# Patient Record
Sex: Male | Born: 1935 | Race: White | Hispanic: No | Marital: Married | Smoking: Former smoker
Health system: Southern US, Community
[De-identification: ages and names within clinical notes are randomized; demographics above are authoritative.]

## PROBLEM LIST (undated history)

## (undated) DIAGNOSIS — I509 Heart failure, unspecified: Secondary | ICD-10-CM

## (undated) DIAGNOSIS — I2510 Atherosclerotic heart disease of native coronary artery without angina pectoris: Secondary | ICD-10-CM

## (undated) DIAGNOSIS — E785 Hyperlipidemia, unspecified: Secondary | ICD-10-CM

## (undated) DIAGNOSIS — F0390 Unspecified dementia without behavioral disturbance: Secondary | ICD-10-CM

## (undated) DIAGNOSIS — I513 Intracardiac thrombosis, not elsewhere classified: Secondary | ICD-10-CM

## (undated) DIAGNOSIS — I10 Essential (primary) hypertension: Secondary | ICD-10-CM

## (undated) DIAGNOSIS — J449 Chronic obstructive pulmonary disease, unspecified: Secondary | ICD-10-CM

## (undated) DIAGNOSIS — I4891 Unspecified atrial fibrillation: Secondary | ICD-10-CM

## (undated) DIAGNOSIS — IMO0001 Reserved for inherently not codable concepts without codable children: Secondary | ICD-10-CM

## (undated) HISTORY — DX: Unspecified dementia, unspecified severity, without behavioral disturbance, psychotic disturbance, mood disturbance, and anxiety: F03.90

## (undated) HISTORY — DX: Intracardiac thrombosis, not elsewhere classified: I51.3

## (undated) HISTORY — DX: Atherosclerotic heart disease of native coronary artery without angina pectoris: I25.10

## (undated) HISTORY — DX: Essential (primary) hypertension: I10

## (undated) HISTORY — DX: Heart failure, unspecified: I50.9

## (undated) HISTORY — PX: HERNIA REPAIR: SHX51

## (undated) HISTORY — DX: Hyperlipidemia, unspecified: E78.5

---

## 1998-05-17 HISTORY — PX: CARDIAC CATHETERIZATION: SHX172

## 1999-05-14 ENCOUNTER — Inpatient Hospital Stay (HOSPITAL_COMMUNITY): Admission: EM | Admit: 1999-05-14 | Discharge: 1999-05-17 | Payer: Self-pay | Admitting: Emergency Medicine

## 1999-05-14 ENCOUNTER — Encounter: Payer: Self-pay | Admitting: Emergency Medicine

## 1999-05-16 ENCOUNTER — Encounter: Payer: Self-pay | Admitting: Cardiology

## 2001-04-03 ENCOUNTER — Encounter: Payer: Self-pay | Admitting: General Surgery

## 2001-04-03 ENCOUNTER — Encounter: Admission: RE | Admit: 2001-04-03 | Discharge: 2001-04-03 | Payer: Self-pay | Admitting: General Surgery

## 2001-04-04 ENCOUNTER — Ambulatory Visit (HOSPITAL_BASED_OUTPATIENT_CLINIC_OR_DEPARTMENT_OTHER): Admission: RE | Admit: 2001-04-04 | Discharge: 2001-04-05 | Payer: Self-pay | Admitting: General Surgery

## 2001-04-04 ENCOUNTER — Encounter (INDEPENDENT_AMBULATORY_CARE_PROVIDER_SITE_OTHER): Payer: Self-pay | Admitting: *Deleted

## 2002-02-07 ENCOUNTER — Ambulatory Visit (HOSPITAL_COMMUNITY): Admission: EM | Admit: 2002-02-07 | Discharge: 2002-02-08 | Payer: Self-pay | Admitting: Cardiovascular Disease

## 2002-02-07 ENCOUNTER — Encounter: Payer: Self-pay | Admitting: *Deleted

## 2006-05-13 ENCOUNTER — Encounter: Admission: RE | Admit: 2006-05-13 | Discharge: 2006-05-13 | Payer: Self-pay | Admitting: Family Medicine

## 2006-05-17 HISTORY — PX: ABDOMINAL AORTIC ANEURYSM REPAIR: SUR1152

## 2006-06-03 ENCOUNTER — Encounter: Admission: RE | Admit: 2006-06-03 | Discharge: 2006-06-03 | Payer: Self-pay | Admitting: Vascular Surgery

## 2006-06-20 ENCOUNTER — Ambulatory Visit: Payer: Self-pay | Admitting: *Deleted

## 2006-06-22 ENCOUNTER — Ambulatory Visit: Payer: Self-pay | Admitting: Vascular Surgery

## 2006-06-22 ENCOUNTER — Inpatient Hospital Stay (HOSPITAL_COMMUNITY): Admission: RE | Admit: 2006-06-22 | Discharge: 2006-06-27 | Payer: Self-pay | Admitting: Vascular Surgery

## 2006-06-22 ENCOUNTER — Encounter (INDEPENDENT_AMBULATORY_CARE_PROVIDER_SITE_OTHER): Payer: Self-pay | Admitting: Specialist

## 2006-07-13 ENCOUNTER — Ambulatory Visit: Payer: Self-pay | Admitting: Vascular Surgery

## 2006-07-22 ENCOUNTER — Ambulatory Visit: Payer: Self-pay | Admitting: Vascular Surgery

## 2007-08-02 ENCOUNTER — Ambulatory Visit: Payer: Self-pay | Admitting: Vascular Surgery

## 2008-01-29 ENCOUNTER — Encounter: Admission: RE | Admit: 2008-01-29 | Discharge: 2008-01-29 | Payer: Self-pay | Admitting: Sports Medicine

## 2008-02-12 ENCOUNTER — Encounter: Admission: RE | Admit: 2008-02-12 | Discharge: 2008-02-12 | Payer: Self-pay | Admitting: Sports Medicine

## 2008-03-11 ENCOUNTER — Encounter: Admission: RE | Admit: 2008-03-11 | Discharge: 2008-03-11 | Payer: Self-pay | Admitting: Sports Medicine

## 2008-07-31 ENCOUNTER — Encounter: Admission: RE | Admit: 2008-07-31 | Discharge: 2008-07-31 | Payer: Self-pay | Admitting: Vascular Surgery

## 2008-07-31 ENCOUNTER — Ambulatory Visit: Payer: Self-pay | Admitting: Vascular Surgery

## 2009-08-13 ENCOUNTER — Ambulatory Visit: Payer: Self-pay | Admitting: Vascular Surgery

## 2009-12-18 ENCOUNTER — Ambulatory Visit: Payer: Self-pay | Admitting: Cardiology

## 2010-01-15 ENCOUNTER — Ambulatory Visit: Payer: Self-pay | Admitting: Cardiology

## 2010-02-13 ENCOUNTER — Ambulatory Visit: Payer: Self-pay | Admitting: Cardiology

## 2010-03-16 ENCOUNTER — Ambulatory Visit: Payer: Self-pay | Admitting: Cardiology

## 2010-04-13 ENCOUNTER — Ambulatory Visit: Payer: Self-pay | Admitting: Cardiology

## 2010-04-13 LAB — POCT INR: INR: 3.2

## 2010-04-27 ENCOUNTER — Ambulatory Visit: Payer: Self-pay | Admitting: Cardiology

## 2010-04-27 LAB — POCT INR: INR: 2.2

## 2010-06-01 ENCOUNTER — Ambulatory Visit: Payer: Self-pay | Admitting: Cardiology

## 2010-06-07 ENCOUNTER — Encounter: Payer: Self-pay | Admitting: Family Medicine

## 2010-06-24 ENCOUNTER — Encounter (INDEPENDENT_AMBULATORY_CARE_PROVIDER_SITE_OTHER): Payer: Medicare Other

## 2010-06-24 DIAGNOSIS — Z7901 Long term (current) use of anticoagulants: Secondary | ICD-10-CM

## 2010-06-24 DIAGNOSIS — I2510 Atherosclerotic heart disease of native coronary artery without angina pectoris: Secondary | ICD-10-CM

## 2010-06-25 ENCOUNTER — Encounter: Payer: Self-pay | Admitting: Cardiology

## 2010-06-25 DIAGNOSIS — I2510 Atherosclerotic heart disease of native coronary artery without angina pectoris: Secondary | ICD-10-CM | POA: Insufficient documentation

## 2010-06-25 DIAGNOSIS — E785 Hyperlipidemia, unspecified: Secondary | ICD-10-CM | POA: Insufficient documentation

## 2010-06-25 DIAGNOSIS — I513 Intracardiac thrombosis, not elsewhere classified: Secondary | ICD-10-CM | POA: Insufficient documentation

## 2010-06-25 DIAGNOSIS — I110 Hypertensive heart disease with heart failure: Secondary | ICD-10-CM | POA: Insufficient documentation

## 2010-06-25 DIAGNOSIS — I5042 Chronic combined systolic (congestive) and diastolic (congestive) heart failure: Secondary | ICD-10-CM | POA: Insufficient documentation

## 2010-07-22 ENCOUNTER — Other Ambulatory Visit (INDEPENDENT_AMBULATORY_CARE_PROVIDER_SITE_OTHER): Payer: Medicare Other

## 2010-07-22 DIAGNOSIS — I2510 Atherosclerotic heart disease of native coronary artery without angina pectoris: Secondary | ICD-10-CM

## 2010-07-22 DIAGNOSIS — Z7901 Long term (current) use of anticoagulants: Secondary | ICD-10-CM

## 2010-08-12 ENCOUNTER — Ambulatory Visit (INDEPENDENT_AMBULATORY_CARE_PROVIDER_SITE_OTHER): Payer: Medicare Other | Admitting: Cardiology

## 2010-08-12 ENCOUNTER — Encounter: Payer: Self-pay | Admitting: Cardiology

## 2010-08-12 DIAGNOSIS — I509 Heart failure, unspecified: Secondary | ICD-10-CM

## 2010-08-12 DIAGNOSIS — F17200 Nicotine dependence, unspecified, uncomplicated: Secondary | ICD-10-CM

## 2010-08-12 DIAGNOSIS — I219 Acute myocardial infarction, unspecified: Secondary | ICD-10-CM

## 2010-08-12 DIAGNOSIS — I10 Essential (primary) hypertension: Secondary | ICD-10-CM

## 2010-08-12 DIAGNOSIS — Z720 Tobacco use: Secondary | ICD-10-CM

## 2010-08-12 DIAGNOSIS — E7800 Pure hypercholesterolemia, unspecified: Secondary | ICD-10-CM

## 2010-08-12 DIAGNOSIS — I513 Intracardiac thrombosis, not elsewhere classified: Secondary | ICD-10-CM

## 2010-08-12 DIAGNOSIS — E785 Hyperlipidemia, unspecified: Secondary | ICD-10-CM

## 2010-08-12 DIAGNOSIS — I259 Chronic ischemic heart disease, unspecified: Secondary | ICD-10-CM

## 2010-08-12 DIAGNOSIS — I2510 Atherosclerotic heart disease of native coronary artery without angina pectoris: Secondary | ICD-10-CM

## 2010-08-12 DIAGNOSIS — Z79899 Other long term (current) drug therapy: Secondary | ICD-10-CM

## 2010-08-12 NOTE — Assessment & Plan Note (Signed)
Remote anterior myocardial infarction. Cardiac catheterization in 2000 showed occlusion of the LAD. He currently has no significant angina. Continue risk factor modification.

## 2010-08-12 NOTE — Assessment & Plan Note (Signed)
Blood pressure is controlled. Continue current therapy. Continue sodium restriction.

## 2010-08-12 NOTE — Assessment & Plan Note (Signed)
Well compensated on exam. Continue ACE inhibitor, diuretics, and beta blocker therapy.

## 2010-08-12 NOTE — Assessment & Plan Note (Signed)
We discussed smoking cessation strategies. This includes prescription medication such as Chantix or Wellbutrin. We also discussed nicotine replacement products such as patches, inhalers, or gum. Patient would like to try nicotine replacement products first and he can obtain these over the counter. If this is unsuccessful  we may try prescription therapy in the future.

## 2010-08-12 NOTE — Assessment & Plan Note (Signed)
We'll check fasting chemistry panel and lipid panel today. Continue on Vytorin.

## 2010-08-12 NOTE — Patient Instructions (Signed)
Continue your current medical therapy.  We will call with the results of your lab work today.  We discussed smoking cessation strategies. We will try the nicotine replacement products such as nicotine patches and or gum that you can obtain over-the-counter.  We will followup your next office visit in 6 months.  Your INR is already scheduled for April.

## 2010-08-12 NOTE — Progress Notes (Signed)
HPI This was seen for six-month followup today. He states he has been doing well without any significant anginal symptoms. He still gets short of breath at times and admits that he needs to quit smoking. He is interested in smoking cessation strategies. He denies any palpitations or dizziness. Allergies  Allergen Reactions  . Codeine   . Morphine And Related     Current Outpatient Prescriptions on File Prior to Visit  Medication Sig Dispense Refill  . aspirin 325 MG tablet Take 325 mg by mouth daily.        Marland Kitchen ezetimibe-simvastatin (VYTORIN) 10-40 MG per tablet Take 1 tablet by mouth at bedtime.        . furosemide (LASIX) 40 MG tablet Take 40 mg by mouth daily. 1/2 DAILY       . GLUCOSAMINE PO Take by mouth 3 (three) times daily.        . Lansoprazole (PREVACID PO) Take by mouth as needed.        . metoprolol (TOPROL-XL) 50 MG 24 hr tablet Take 50 mg by mouth daily.        . nitroGLYCERIN (NITROSTAT) 0.4 MG SL tablet Place 0.4 mg under the tongue every 5 (five) minutes as needed.        . ramipril (ALTACE) 10 MG tablet Take 10 mg by mouth daily.        Marland Kitchen warfarin (COUMADIN) 7.5 MG tablet Take 7.5 mg by mouth daily.        Marland Kitchen zolpidem (AMBIEN) 5 MG tablet Take 5 mg by mouth at bedtime as needed.          Past Medical History  Diagnosis Date  . Coronary disease     WITH REMOTE ANTERIOR MYOCARDIAL INFARCTION  . Mural thrombus of heart     CHRONIC  . Hypertension   . Hyperlipidemia   . CHF (congestive heart failure)     Past Surgical History  Procedure Date  . Cardiac catheterization 2000  . Hernia repair     STATUS POST BILATERAL  . Abdominal aortic aneurysm repair 2008    Family History  Problem Relation Age of Onset  . Cerebral aneurysm Father   . Cancer Sister   . Cancer Mother     History   Social History  . Marital Status: Married    Spouse Name: N/A    Number of Children: 2  . 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 Smoking status: Current Everyday Smoker -- 1.0 packs/day for 60 years    Types: Cigarettes  . Smokeless tobacco: Not on file  . Alcohol Use: No  . Drug Use: No  . Sexually Active:    Other Topics Concern  . Not on file   Social History Narrative  . No narrative on file    ROS HPI  Allergies  Allergen Reactions  . Codeine   . Morphine And Related     Current Outpatient Prescriptions on File Prior to Visit  Medication Sig Dispense Refill  . aspirin 325 MG tablet Take 325 mg by mouth daily.        Marland Kitchen ezetimibe-simvastatin (VYTORIN) 10-40 MG per tablet Take 1 tablet by mouth at bedtime.        . furosemide (LASIX) 40 MG tablet Take 40 mg by mouth daily. 1/2 DAILY       . GLUCOSAMINE PO Take by mouth 3 (three) times daily.        Marland Kitchen  Lansoprazole (PREVACID PO) Take by mouth as needed.        . metoprolol (TOPROL-XL) 50 MG 24 hr tablet Take 50 mg by mouth daily.        . nitroGLYCERIN (NITROSTAT) 0.4 MG SL tablet Place 0.4 mg under the tongue every 5 (five) minutes as needed.        . ramipril (ALTACE) 10 MG tablet Take 10 mg by mouth daily.        Marland Kitchen warfarin (COUMADIN) 7.5 MG tablet Take 7.5 mg by mouth daily.        Marland Kitchen zolpidem (AMBIEN) 5 MG tablet Take 5 mg by mouth at bedtime as needed.          Past Medical History  Diagnosis Date  . Coronary disease     WITH REMOTE ANTERIOR MYOCARDIAL INFARCTION  . Mural thrombus of heart     CHRONIC  . Hypertension   . Hyperlipidemia   . CHF (congestive heart failure)     Past Surgical History  Procedure Date  . Cardiac catheterization 2000  . Hernia repair     STATUS POST BILATERAL  . Abdominal aortic aneurysm repair 2008    Family History  Problem Relation Age of Onset  . Cerebral aneurysm Father   . Cancer Sister   . Cancer Mother     History   Social History  . Marital Status: Married    Spouse Name: N/A    Number of Children: 2  . 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 Smoking status: Current Everyday Smoker -- 1.0 packs/day for 60 years    Types: Cigarettes  . Smokeless tobacco: Not on file  . Alcohol Use: No  . Drug Use: No  . Sexually Active:    Other Topics Concern  . Not on file   Social History Narrative  . No narrative on file    ROS The patient denies any heat or cold intolerance.  No weight gain or weight loss.  The patient denies headaches or blurry vision.  There is no cough or sputum production.  The patient denies dizziness.  There is no hematuria or hematochezia.  The patient denies any muscle aches or arthritis.  The patient denies any rash.  The patient denies frequent falling or instability.  There is no history of depression or anxiety.  All other systems were reviewed and are negative.  PHYSICAL EXAM BP 142/82  Pulse 52  Ht 5\' 9"  (1.753 m)  Wt 178 lb 3.2 oz (80.831 kg)  BMI 26.32 kg/m2 He is an elderly, chronically ill-appearing white male in no acute distress. HEENT exam reveals he is normocephalic, atraumatic. His pupils are equal and reactive. Sclera are clear. Oropharynx is clear. Neck is supple without JVD, adenopathy, thyromegaly, or bruits. Lungs are clear. Cardiac exam reveals a regular rate and rhythm without gallop or murmur. Abdomen is soft and nontender without masses or bruits. Femoral and pedal pulses are 2+ and symmetric. He has no edema. Skin is warm and dry. Patient is alert and oriented x3. He does have chronic memory loss. Gait is normal.  ASSESSMENT AND PLAN

## 2010-08-12 NOTE — Assessment & Plan Note (Signed)
On chronic Coumadin therapy. INR has been therapeutic.

## 2010-08-13 LAB — COMPREHENSIVE METABOLIC PANEL
ALT: 20 U/L (ref 0–53)
AST: 21 U/L (ref 0–37)
Albumin: 4.2 g/dL (ref 3.5–5.2)
Chloride: 103 mEq/L (ref 96–112)
Glucose, Bld: 117 mg/dL — ABNORMAL HIGH (ref 70–99)
Total Bilirubin: 2.1 mg/dL — ABNORMAL HIGH (ref 0.3–1.2)
Total Protein: 5.9 g/dL — ABNORMAL LOW (ref 6.0–8.3)

## 2010-08-13 LAB — CBC WITH DIFFERENTIAL/PLATELET
Basophils Relative: 0 % (ref 0–1)
Eosinophils Relative: 3 % (ref 0–5)
MCV: 101.7 fL — ABNORMAL HIGH (ref 78.0–100.0)
Monocytes Absolute: 0.5 10*3/uL (ref 0.1–1.0)
Neutrophils Relative %: 63 % (ref 43–77)
Platelets: 122 10*3/uL — ABNORMAL LOW (ref 150–400)
RBC: 5.39 MIL/uL (ref 4.22–5.81)
RDW: 14.8 % (ref 11.5–15.5)
WBC: 7.2 10*3/uL (ref 4.0–10.5)

## 2010-08-13 LAB — TSH: TSH: 6.069 u[IU]/mL — ABNORMAL HIGH (ref 0.350–4.500)

## 2010-08-17 ENCOUNTER — Telehealth: Payer: Self-pay | Admitting: *Deleted

## 2010-08-17 NOTE — Telephone Encounter (Signed)
Message copied by Murrell Redden on Mon Aug 17, 2010  1:50 PM ------      Message from: [place], PETER      Created: Thu Aug 13, 2010  1:16 PM       Labs overall OK. Increased Hgb consistent with chronic tobacco abuse. Mild increase in bilirubin. Lipids ok. Mild incresed in TSH. Would repeat TSH and free T4 next visit.

## 2010-08-17 NOTE — Telephone Encounter (Signed)
Notified of lab results. 

## 2010-09-09 ENCOUNTER — Encounter: Payer: Medicare Other | Admitting: *Deleted

## 2010-09-15 ENCOUNTER — Encounter: Payer: Medicare Other | Admitting: *Deleted

## 2010-09-17 ENCOUNTER — Other Ambulatory Visit: Payer: Self-pay | Admitting: *Deleted

## 2010-09-17 DIAGNOSIS — I219 Acute myocardial infarction, unspecified: Secondary | ICD-10-CM

## 2010-09-18 ENCOUNTER — Other Ambulatory Visit: Payer: Self-pay | Admitting: *Deleted

## 2010-09-18 ENCOUNTER — Ambulatory Visit (INDEPENDENT_AMBULATORY_CARE_PROVIDER_SITE_OTHER): Payer: Medicare Other | Admitting: *Deleted

## 2010-09-18 DIAGNOSIS — I219 Acute myocardial infarction, unspecified: Secondary | ICD-10-CM

## 2010-09-18 DIAGNOSIS — I8290 Acute embolism and thrombosis of unspecified vein: Secondary | ICD-10-CM

## 2010-09-18 LAB — POCT INR: INR: 3.1

## 2010-09-18 LAB — PROTIME-INR: INR: 3.1 — AB (ref 0.9–1.1)

## 2010-09-29 NOTE — Assessment & Plan Note (Signed)
OFFICE VISIT   CHAO, BLAZEJEWSKI  DOB:  1933/04/18                                       08/02/2007  EAVWU#:[id number]   The patient presents for followup today after repair of his juxtarenal  abdominal aortic aneurysm on February 6.  He states that his appetite  has essentially returned to normal.  He is having normal bowel  movements.  He has essentially returned to his normal activities.   PHYSICAL EXAM:  Blood pressure is 147/88 in the left arm, heart rate is  72 and regular.  Abdomen incision is well-healed.  There is no hernia or  defect.  He has 2+ femoral pulses bilaterally.  The patient is doing  well and has recovered from his aneurysm repair at this point.  He will  follow up with me and a CT angiogram in 1 year.   Janetta Hora. Fields, MD  Electronically Signed   CEF/MEDQ  D:  08/02/2007  T:  08/03/2007  Job:  [id number]   cc:   Teena Irani. Arlyce Dice, M.D.

## 2010-09-29 NOTE — Procedures (Signed)
RENAL ARTERY DUPLEX EVALUATION   INDICATION:  Followup renal artery stenosis and abdominal aortic  aneurysm repair.   HISTORY:  Diabetes:  No.  Cardiac:  No.  Hypertension:  Yes.  Smoking:  Yes.   RENAL ARTERY DUPLEX FINDINGS:  Aorta-Proximal:  71 cm/s  Aorta-Mid:  62 cm/s  Aorta-Distal:  75 cm/s (graft)  Celiac Artery Origin:  118 cm/s  SMA Origin:  99 cm/s                                    RIGHT               LEFT  Renal Artery Origin:             125 cm/s            97 cm/s  Renal Artery Proximal:           141 cm/s            89 cm/s  Renal Artery Mid:                117 cm/s            72 cm/s  Renal Artery Distal:             47 cm/s             48 cm/s  Hilar Acceleration Time (AT):    m/s2                m/s2  Renal-Aortic Ratio (RAR):        1.98                1.37  Kidney Size:                     11 cm               10.7 cm  End Diastolic Ratio (EDR):  Resistive Index (RI):            0.71                0.7   IMPRESSION:  1. Bilateral renal arteries appear patent and within normal limits.  2. Evidence of multiple bilateral renal arteries.  3. Bilateral RI appear within normal limits.  4. Bilateral RAR appear within normal limits.  5. Bilateral kidneys appear within normal limits in regard to shape      and size.  6. Stable cysts from CT with measurements today R=3.58 cm x 3.49 cm,      L=4.15 cm x 4.98 cm.  7. Residual abdominal aortic aneurysm sac measures 3.45 cm x 3.38 cm      and appear stable.  8. Patent aortic graft with no evidence of stenosis.  9. No bowel gas and acoustic shadowing.   ___________________________________________  Janetta Hora. Fields, MD   AS/MEDQ  D:  08/13/2009  T:  08/13/2009  Job:  [id number]

## 2010-09-29 NOTE — Assessment & Plan Note (Signed)
OFFICE VISIT   Jay Guzman, Jay Guzman  DOB:  [birth year]                                       07/31/2008  [id number]#:[id number]   Mr. Gravlin is a 77 year old male who previously underwent repair of a  juxtarenal abdominal aortic aneurysm in February 2008.  He returns today  for regular scheduled followup.  He denies any back or abdominal pain.  His atherosclerotic risk factors continue to remain coronary artery  disease.  He had myocardial infarction in 2008.  He also has a history  of congestive heart failure and elevated cholesterol.   MEDICATIONS:  The patient is on aspirin 325 mg a day and Coumadin 5 mg a  day.  He did not remember the rest of his medications currently.   SOCIAL:  He is married, has 2 children, still works as a Electronics engineer.  He currently smokes 1 pack of cigarettes per day.  It was  discussed with him today for approximately 3 minutes smoking cessation.   REVIEW OF SYSTEMS:  He is 5 foot 9, 182 pounds.  He has had some mild  weight loss recently.  He also complains of some occasional arthritis,  muscle and joint pain.  Cardiac, pulmonary, GI, renal, neurologic,  psychiatric, ENT, and hematologic review of systems are all negative.   PHYSICAL EXAM:  Blood pressure 131/79 in the right arm, pulse 71 and  regular.  HEENT:  Unremarkable.  NECK:  2+ carotid pulses without bruit.  CHEST:  Clear to auscultation.  HEART:  Exam is regular rate and rhythm without murmur.  ABDOMEN:  Soft, nontender, nondistended.  No pulsatile mass.  EXTREMITIES:  He has 2+ radial, femoral, dorsalis pedis, and posterior  tibial pulses bilaterally.   He had a CT scan of abdomen and pelvis today which showed bilateral  patent renal arteries with no evidence of pseudoaneurysm or narrowing of  his aortic graft.  However, there was evidence that he had a large  amount of layered thrombus in a preexisting known ventricular aneurysm.  I called Dr. Delane Ginger and discussed this with him.  He will make a  note of this for Dr. Peter [place]aziland, the patient's cardiologist.  He has  followup scheduled with Dr. [place]and next week and he may need possibly to  increase his INR level to make sure that this thrombus does not continue  to accumulate.   As far as his aneurysm is concerned, everything looks well at this  point.  Mr. Wanninger will need a followup in 1 year's time and aortic  ultrasound at that time for further followup.   Janetta Hora. Fields, MD  Electronically Signed   CEF/MEDQ  D:  08/05/2008  T:  08/06/2008  Job:  [id number]   cc:   Peter M. [place], M.D.

## 2010-10-02 NOTE — H&P (Signed)
NAME:  Jay Guzman, Jay Guzman                       ACCOUNT NO.:  [id number]   MEDICAL RECORD NO.:  [id number]                   PATIENT TYPE:  OIB   LOCATION:  2014                                 FACILITY:  [facility]   PHYSICIAN:  Vesta Mixer, M.D.              DATE OF BIRTH:  [birth year]   DATE OF ADMISSION:  02/07/2002  DATE OF DISCHARGE:  02/08/2002                                HISTORY & PHYSICAL   HISTORY OF PRESENT ILLNESS:  Jay Guzman is a 77 year old gentleman with a  history of coronary artery disease with an occlusion of his left anterior  descending artery.  He was transferred from [facility] to [facility] after having an episode of dizziness and hypotension.   The patient has been in relatively good health.  He has been on his same  medications for the past several months.  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  The dizziness did not resolve even after 20-30 minutes and  was taken to [facility].  He was found to have a significant  hypotension with a systolic blood pressure in the 80s.  He responded quite  nicely to 400 cc of intravenous fluids.  His blood pressure increased to 105  without any problems.  Because he had had a previous anterior wall  myocardial infarction without any symptoms it was recommended that he stay  overnight for observation.  He wanted to be transferred to [facility] since  his doctors are down here.  He is transferred for further evaluation.   The patient currently feels better.  He has not had any episodes of chest  pain or shortness of breath.  He has not had any further episodes of  dizziness since this morning.   CURRENT MEDICATIONS:  Coumadin 10 mg three days a week with 7.5 mg four days  a week, Zocor 20 mg a day, Toprol XL 50 mg a day, aspirin 325 mg a day,  Lasix 40 mg a day, Altace 10 mg a day.   ALLERGIES:   MORPHINE AND CODEINE.   PAST MEDICAL HISTORY:  1. Coronary artery disease - he has a known occlusion of his mid-LAD.  2. Hypercholesterolemia.  3. Hypertension.   SOCIAL HISTORY:  The patient smokes about 1-1/2 packs of cigarettes a day.  He does not drink alcohol.   FAMILY HISTORY:  Noncontributory.   REVIEW OF SYSTEMS:  Reviewed and essentially negative.   PHYSICAL EXAMINATION:  GENERAL:  He is a middle-aged gentleman in no acute  distress.  He is alert and oriented times 3.  His mood and affect are  normal.   VITALS:  Blood pressure is 110/70, heart rate is 66.   HEENT EXAM:  Reveals 2+ carotids.  He has no bruits.  There is no JVD,  no  thyromegaly.   LUNGS:  Clear to auscultation.   HEART:  Regular rate, S1 and S2.  He has no murmurs.   ABDOMEN EXAM:  Revealed good bowel sounds and is nontender.   EXTREMITIES:  He has no clubbing, cyanosis, or edema.   NEURO EXAM:  Nonfocal.  His gait is normal.   His EKG is pending.   His telemetry monitor reveals normal sinus rhythm.   LABORATORY DATA:  His troponin and CPKs were negative at [facility].   ASSESSMENT/PLAN:  [name] presents with an episode of lightheadedness  and hypotension.  He responded quite nicely to one bullous of intravenous  fluids.  He has not had any further episodes.  We will continue with the  same medications.  We will check cardiac enzymes.  We will also check a  thyroid-stimulating hormone and monitor him overnight.  If he does not have  any further episodes then he should be able to go home tomorrow.  We may end  up having to decrease his dose of anti-hypertensives or his Lasix.  We will  lead Dr. Swaziland make that decision in the morning.  We will also add a  thyroid-stimulating hormone to his next blood draw.  All of his other  medical problems are stable.                                               Vesta Mixer, M.D.    PJN/MEDQ  D:  02/07/2002  T:  02/08/2002  Job:   [id number]   cc:   Peter M. [place], M.D.  [address], [address]  [place], [place] [address]  Fax: [phone]   Armstead Peaks, M.D.  [address] [place], [place] [address]  Fax: [phone]

## 2010-10-02 NOTE — H&P (Signed)
Jay Guzman, Jay Guzman             ACCOUNT NO.:  [id number]   MEDICAL RECORD NO.:  [id number]          PATIENT TYPE:  INP   LOCATION:  NA                           FACILITY:  [facility]   PHYSICIAN:  Charles E. Fields, MD  DATE OF BIRTH:  05/04/1933   DATE OF ADMISSION:  06/22/2006  DATE OF DISCHARGE:                              HISTORY & PHYSICAL   REASON FOR ADMISSION:  Abdominal aortic aneurysm.   HISTORY OF PRESENT ILLNESS:  The patient is a 77 year old white male who  was recently referred to Dr. Darrick Penna for evaluation of an abdominal  aortic aneurysm.  Apparently when the patient was involved in an  accident around 10 years ago, he had a CT scan which showed an  incidental 2.5 cm abdominal aortic aneurysm.  He was told to follow up  annually for repeat CT scans and surveillance, but has been lost to  follow-up.  He recently became established with Dr. Dara Hoyer at  [facility], and mentioned his history of an aneurysm.  Dr. Arlyce Dice ordered a CT scan, and this confirmed a 6.1 cm abdominal  aortic aneurysm.  He was referred to Dr. Darrick Penna, and Dr. Darrick Penna  subsequently ordered a CT angiogram to further delineate his anatomy for  possible stent graft repair.  This showed the abdominal aortic aneurysm  along with bilateral dual renal artery systems.  The abdominal aortic  aneurysm neck was shortened and there was no involvement of the common  iliac arteries bilaterally, although there was common iliac artery  tortuosity.  It was felt that because of all these factors he was a poor  candidate for stent graft repair, and therefore based on the size of the  aneurysm Dr. Darrick Penna recommended proceeding with open abdominal aortic  aneurysm repair at this time.  The patient denies abdominal pain, back  pain or any other symptoms referable to the aneurysm.   PAST MEDICAL HISTORY:  1. Coronary artery disease status post previous myocardial infarction.  2. History of congestive  heart failure.  3. Hypertension.  4. Hyperlipidemia.   PAST SURGICAL HISTORY:  Bilateral inguinal hernia repairs.   MEDICATIONS:  1. Toprol XL 50 mg daily.  2. Coumadin 10 mg on Friday and 7.5 mg taken all other days (last      taken on June 17, 2006).  3. Furosemide 20 mg daily.  4. Altace 10 mg daily.  5. Aspirin 325 mg daily.  6. Vytorin 10/40mg  daily.   ALLERGIES:  He reports intolerances to MORPHINE AND CODEINE, which make  him crazy and gave him nerve problems.   SOCIAL HISTORY:  He is married and has 2 children.  He resides with his  wife in [place].  He continues to run a Company secretary.  He continues to smoke one to 1-1/2 half packs of cigarettes  per day, and has smoked for 58 years.  He does not consume alcohol.   FAMILY HISTORY:  His mother is deceased and had breast cancer and  coronary artery disease.  His father died of a brain aneurysm.  He had  one sister who died of breast cancer.  He has 3 living sisters; one who  has a history of a brain aneurysm, one who has a history of breast  cancer, and one who does not have any chronic medical problems of which  he is aware.   REVIEW OF SYSTEMS:  See history of present illness for pertinent  positives and negatives.  Also, he has some dyspnea on exertion,  particularly when climbing up hills.  He wears glasses.  He also has  some hearing loss.  He also complains of pain in his legs with  significant exertion, such as walking up hills.  He denies fevers,  chills, weight loss, recent infections, TIA symptoms, visual changes,  amaurosis fugax, dysphagia, dysarthria, syncope, presyncope, chest pain,  heart palpitations, shortness of breath, PND, orthopnea, cough,  wheezing, hemoptysis, abdominal pain, nausea, vomiting, diarrhea,  constipation, reflux symptoms; hematochezia, melena, hematuria, dysuria  or nocturia; lower extremity edema, lower extremity claudication  symptoms or rest pain,  nonhealing lower extremity ulcers, muscle or  joint pain or weakness, intolerance to heat or cold, anxiety, depression  or other psychiatric illness.   PHYSICAL EXAMINATION:  VITAL SIGNS:  Blood pressure 122/72, heart rate  64 and regular, respirations 18 and unlabored.  GENERAL:  This is an elderly white male in no acute distress.  HEENT: Normocephalic, atraumatic.  Pupils equal, round and react to  light and accommodation.  Extraocular movements intact.  The right TM  and canal are well visualized; the right canal is occluded with cerumen.  Nares patent bilaterally.  Oropharynx clear with upper dentures in place  and edentulous lower.  NECK:  Supple without lymphadenopathy, thyromegaly or carotid bruits.  HEART:  Regular rate and rhythm without murmurs, rubs or gallops.  LUNGS: Clear to auscultation.  ABDOMEN: Soft, obese, nontender, nondistended.  Active bowel sounds in  all quadrants.  I am unable to palpate a midline mass, secondary to his  obesity.  EXTREMITIES:  No clubbing, cyanosis or edema.  He has well-healed  bilateral groin scars from his inguinal hernia repairs.  Femoral pulses  are 2+ and symmetrical.  He has 1-2+ right dorsalis pedis and a 1-2+  left posterior tibial pulse; no other palpable pedal pulses.  NEUROLOGIC:  Cranial nerves II-XII grossly intact.  He is alert and  oriented x3.  His gait is within normal limits for age.  Muscle strength  is 5+ and symmetrical in the upper and lower extremities.   ASSESSMENT/PLAN:  This is a 77 year old male with a 6.1 cm abdominal  aortic aneurysm.  He will be admitted to [facility] on June 22, 2006 and undergo repair of his abdominal aortic aneurysm by Dr.  Fabienne Bruns.      Coral Ceo, P.A.      Janetta Hora. Fields, MD  Electronically Signed    GC/MEDQ  D:  06/20/2006  T:  06/20/2006  Job:  [id number]   cc:   Janetta Hora. Darrick Penna, MD  Peter M. [place], M.D.

## 2010-10-02 NOTE — Op Note (Signed)
NAMELUISFERNANDO, BRIGHTWELL             ACCOUNT NO.:  [id number]   MEDICAL RECORD NO.:  [id number]          PATIENT TYPE:  INP   LOCATION:  2315                         FACILITY:  [facility]   PHYSICIAN:  Janetta Hora. Fields, MD  DATE OF BIRTH:  1933/03/11   DATE OF PROCEDURE:  06/22/2006  DATE OF DISCHARGE:                               OPERATIVE REPORT   PROCEDURE:  Repair of abdominal aortic aneurysm.   PREOPERATIVE DIAGNOSIS:  Juxtarenal abdominal aortic aneurysm.   POSTOPERATIVE DIAGNOSIS:  Juxtarenal abdominal aortic aneurysm.   ANESTHESIA:  General.   ASSISTANT:  Zadie Rhine, PA-C; Coral Ceo, PA-C.   INDICATIONS:  The patient is a 77 year old male who has developed a  large juxtarenal abdominal aortic aneurysm.  He has a duplicated renal  arteries bilaterally and was not a candidate for endovascular stent  graft repair due to his accessory renals and short neck length.   OPERATIVE FINDINGS:  18 mm Dacron tube graft.   OPERATIVE DETAIL:  After obtaining informed consent, the patient taken  to the operating room.  The patient placed supine position operating  table.  After induction of general anesthesia and endotracheal  intubation, a nasogastric tube and Foley catheter were placed.  The  patient's entire abdomen and upper thighs were prepped, draped usual  sterile fashion.  Midline laparotomy incision was made, carried down  through subcutaneous tissues.  Fascia was divided and the peritoneum was  entered.  Exploration of abdomen revealed no significant pathology  except for a large infrarenal abdominal aortic aneurysm.  Omni retractor  was brought to the operative field and the small bowel was reflected to  the right, transverse colon and omentum was reflected superiorly.  Retroperitoneum was entered with cautery dissection.  Dissection was  carried up to the level left renal vein.  Omni retractor was then placed  in a position such that the left renal vein was retracted  superiorly to  expose the neck to the aneurysm.  There were two right renal arteries  dissected free.  The left inferior pole renal artery was just at the  neck of the aneurysm.  The larger left main renal artery was  approximately 1-2 cm above this.  The accessory renal artery on the left  side was dissected free circumferentially controlled with vessel loop.  Dissection was carried down to the main portion of the aneurysm, the  inferior mesenteric artery was dissected free circumferentially  controlled with vessel loop.  Next the left and right common iliac  arteries were dissected free circumferentially just below the aortic  bifurcation.  Umbilical tapes were placed around these.  Next the  patient was given 7000 units of intravenous heparin amd 25 grams of  mannitol.  The aorta was clamped with the clamp above the accessory left  renal artery but below both right renal arteries. Both common iliac  arteries were clamped just below the aortic bifurcation.  The aneurysm  was entered.  Contents were removed.  Several lumbar arteries were  oversewn with 2-0 silk figure-of-eight sutures.  The aneurysm sac was  opened all the way up to the  neck of the aneurysm.  There was a large  amount of calcification in the neck.  This was endarterectomized to  provide more suitable sewing surface.  18 mm Dacron graft was brought up  in the operative field and this was sewn end-to-end to the aorta using a  running 3-0 Prolene suture.  At completion of anastomosis, the left  accessory renal artery was back bled and thoroughly flushed.  The  anastomosis was then tested.  There was one small pleat on the anterior  edge and one small leak from the posterior edge of the graft.  These  were repaired with single 3-0 Prolene sutures.  A pledgeted suture was  placed posteriorly.  The anastomosis was then again tested, found be  hemostatic.  Doppler was used to assess the renal arteries and there was  good flow  through all of these.  Clamp was then moved down the main body  of the graft.  The graft was then cut to length.  The distal anastomosis  was then prepared for grafting.  Again there was a large amount of  calcification along the posterior wall of the aorta in this location and  this was also endarterectomized.  Graft was then sewn end of graft to  end of distal aorta using a running 3-0 Prolene suture.  Just prior to  completion of anastomosis, everything was fore bled, back bled,  thoroughly flushed.  Anastomosis was secured.  Clamp was first released  from the right common iliac artery and then from the main body of the  graft.  There was slight pressure decrease systolically approximately 30  mmHg and this quickly recovered.  Flow was then restored to the left leg  with a similar 10-15 mm drop in pressure initially which quickly  recovered.  The patient had pink feet with palpable pulses at this point  the case.  The inferior mesenteric artery was inspected and found to  have adequate backbleeding.  This was ligated with 2-0 silk tie.  Left  colon was inspected and found be well perfused.  Next, hemostasis was  obtained and the patient was also given 50 mg of protamine.  The  aneurysm sac and retroperitoneum were then reapproximated using running  3-0 Prolene suture.  Viscera were returned to their normal position.  These were inspected and found to be pink and healthy in appearance.  Fascia was then reapproximated using running #1 PDS suture.  Abdominal  wound was thoroughly irrigated with normal saline solution.  The skin  closed with staples.  The patient tolerated procedure well and there  were no complications.  Instrument, sponge and needle count was correct  at end of the case.  The patient taken to recovery room in stable  condition.      Janetta Hora. Fields, MD  Electronically Signed     CEF/MEDQ  D:  06/23/2006  T:  06/23/2006  Job:  [id number]

## 2010-10-02 NOTE — Cardiovascular Report (Signed)
[place]. [facility]  Patient:    Jay Guzman                     MRN: [id number] Proc. Date: 05/15/99 Adm. Date:  [id number] Attending:  [place], Peter Manning CC:         Victoriano Lain, M.D.                        Cardiac Catheterization  INDICATION FOR PROCEDURE:  The patient is a 77 year old white male who presents  with new onset of congestive heart failure.  Recent evaluations demonstrated evidence of prior anterior myocardial infarction, both by echocardiogram and electrocardiogram.  ACCESS:  Via the right femoral artery using standard Seldinger technique.  EQUIPMENT:  A 6-French 4-cm right and left Judkins catheter, 6-French pigtail catheter, 6-French arterial sheath, 8-French venous sheath, 7-French balloon-tip Swan-Ganz catheter.  MEDICATIONS:  Versed 2 mg IV.  Local anesthesia 1% Xylocaine.  CONTRAST:  Omnipaque, 180 cc.  COMMENTARY:  The patient tolerated the procedure well without complications.  HEMODYNAMIC DATA:  Right heart pressures demonstrated a right atrial pressure of 15/9 with a mean of 9 mmHg.  Right ventricular pressure was 30 with an EDP of 10 mmHg.  Pulmonary artery pressure 32/16 with a mean of 23 mmHg.  Pulmonary capillary wedge pressure was 13/11 with a mean of 13 mmHg.  Aortic pressure was 126/75 with a mean of 96.  Left ventricular pressure was 113 with an EDP of 13.  There is no significant mitral valve or aortic valve gradient.  Saturations demonstrate no evidence of shunt.  Cardiac output by FICK is 3.65 liters per minute with an index of 1.7.  Cardiac output by thermodilution is 3.1 liters per minute with an index of 1.5.  ANGIOGRAPHIC DATA:  The left coronary artery arises and distributes normally. 1. The left main coronary artery is normal. 2. The left anterior descending artery is occluded proximally after the first    septal perforator.  There is faint left-to-left collaterals to the mid LAD and  very faint right-to-left collaterals to the apical LAD. 3. There is a large intermedius vessel which appears normal. 4. The left circumflex coronary artery appears normal. 4. The right coronary artery arises and distributes normally.  It has minor wall    irregularities, less than 20% proximally.  A left ventricular angiography was performed in the RAO and LAO cranial views.  This demonstrates a large dyskinetic area involving the mid to distal septum and apex.  The distal anterior wall is akinetic as is the distal inferior wall. There is a rounded filling defect in the apex consistent with a mural thrombus. There is no mitral regurgitation or prolapse.  The aortic valve appears normal. Estimated ejection fraction is 30-35%.  Abdominal aortography demonstrates normal mesenteric arterial flow.  Both kidneys have dual arterial supply.  The more inferior right renal artery has 70% stenosis proximally.  The remainder of renal arteries appear normal.  There is a small to moderate sized infrarenal abdominal aortic aneurysm.  Both iliac arteries appear normal.  FINAL INTERPRETATION: 1. Single-vessel occlusive atherosclerotic coronary artery disease. 2. Moderate to severe left ventricular dysfunction. 3. Apical mural thrombus. 4. Normal right heart pressures. 5. No shunt. 6. Small to moderate infrarenal abdominal aortic aneurysm.  PLAN:  Continued medical therapy. DD:  05/15/99 TD:  05/16/99 Job: [id number]

## 2010-10-02 NOTE — Op Note (Signed)
. [facility]  Patient:    Jay Guzman, Jay Guzman Visit Number: [id number] MRN: [id number]          Service Type: Attending:  Anselm Pancoast. Zachery Dakins, M.D. Dictated by:   Anselm Pancoast. Zachery Dakins, M.D. Proc. Date: 04/04/01                             Operative Report  PREOPERATIVE DIAGNOSIS:  Right inguinal hernia.  OPERATION:  Right inguinal herniorrhaphy with mesh reinforcement.  ANESTHESIA:  Local with heavy sedation.  SURGEON:  Anselm Pancoast. Zachery Dakins, M.D.  HISTORY:  Mr. Jay Guzman is a 77 year old slightly overweight male who was referred to me by Dr. Peter [place] for management of a symptomatic right inguinal hernia.  He has had the hernia on the right for a significant period of time, and had had a previous left inguinal hernia repair back in the 50s by Dr. _______.  Upon examination with the patient straining, there was a significant bulge, but it was not down into the actual scrotum.  We discontinued his Coumadin about five days ago, and his INR today is in the safe range.  We will plan on repairing the hernia.  I plan on keeping him overnight because of his age and history of cardiac problems which is predominantly that of congestive heart failure.  DESCRIPTION OF PROCEDURE:   The patient preoperatively was given a gram of Kefzol and positioned on the OR table, and then sedation administered.  I then shaved the right groin area and then anesthetized it with Marcaine with Adrenalin, the incision and the ilioinguinal nerve area anesthesia.  In all, a total of about 25 cc of solution was used.  Sharp dissection was carried down through the skin, subcutaneous tissue, and Scarpas fascia.  The superficial veins were clamped and ligated with Vicryl sutures.  The external oblique was opened through the ring.  It had kind of a deep adipose tissue, and then the hernia, I first thought it was probably a direct, but after everything was dissected out, it  appeared to be an indirect with a lot of preperitoneal fatty tissue that had kind of pushed down into the cord structures.  These were all separated from the cord structures.  The hernia sac was ultimately opened up, and then sutured with a running 2-0 Prolene under direct vision.  Then, I actually closed the internal ring or snugged it up with interrupted sutures of 2-0 Surgilon.  I could still place my index finger in the inguinal canal at the completion.  Next, a piece of mesh shaped like a sail slit laterally was sutured, reinforcing the floor, starting at the symphysis pubis and then under the edge of the shelving edge of the inguinal ligament laterally, and then interrupted sutures with 2-0 Prolene superiorly.  The two tails were sutured together lateral, recreating or reinforcing the internal ring.  The cord structures and the ilioinguinal nerve were placed back in their normal position, and the external oblique closed with 3-0 Vicryl.  Scarpas was closed with interrupted 3-0 Vicryl, and the 4-0 Dexon subcuticular, and then benzoin and Steri-Strips on the skin.  The patient tolerated the procedure nicely and was sent to the recovery room in stable postop condition.  I plan on keeping him overnight, but he should be able to be released in the a.m., and hopefully will have no trouble voiding. Dictated by:   Anselm Pancoast. Zachery Dakins, M.D. Attending:  Chrissie Noa  Wanita Chamberlain, M.D. DD:  04/04/01 TD:  04/04/01 Job: [id number]

## 2010-10-02 NOTE — H&P (Signed)
[place]. [facility]  Patient:    Jay Guzman                     MRN: [id number] Adm. Date:  [id number] Attending:  [place], Peter Manning CC:         Lesle Chris, M.D.                         History and Physical  CHIEF COMPLAINT:  Shortness of breath.  HISTORY OF PRESENT ILLNESS:  Mr. Rasmusson is a 77 year old white male, recently  found to have an abnormal ECG.  In retrospect, the patient states he has had some shortness of breath and shoulder discomfort over the past year, but otherwise denies any cardiac complaints and has no known history of myocardial infarction. He does have cardiac risk factors of tobacco abuse and hypercholesterolemia. His initial ECG showed evidence of previous inferior and anterior myocardial infarction.  He subsequently had an echocardiogram that showed anteroseptal and  apical akinesia with an apical mural thrombus.  He was started on Coumadin and cardiac catheterization was recommended, but the patient wanted to defer this until January.  However, over the past 2-3 nights he has experienced orthopnea, relieved with sitting upright.  However, this morning he developed shortness of breath at rest that did not resolve and he came to the emergency room.  He was found to have new onset of congestive heart failure.  Of note, his chest x-ray two weeks ago as without CHF.  PAST MEDICAL HISTORY:  Significant for cervical spondylosis and shoulder problems. He had cervical disk disease.  He reports a history of abdominal aneurysm. Also has an inguinal hernia.  He is status post left inguinal hernia repair.  He has a history of kidney stones.  ALLERGIES:  He is allergic to MORPHINE and CODEINE.  CURRENT MEDICATIONS:  1. Coumadin 5 mg daily.  2. Zocor 20 mg per day.  3. Toprol XL 50 mg daily.  4. Amoxicillin t.i.d. for sinus infection.  5. Aspirin daily.  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   He smokes 1-1/2 packs per day and has for 45 years.  He denies alcohol use.  He drinks 11-12 cups of caffeine per day.  He is married and has two children.  FAMILY HISTORY:  Father died in his 14s of cerebral hemorrhage.  Mother died at age 50 with myocardial infarction.  One sister has had hypertension.  REVIEW OF SYSTEMS:  Otherwise unremarkable.  PHYSICAL EXAMINATION:  GENERAL:  The patient is a pleasant white male in no apparent distress.  VITAL SIGNS:  Blood pressure 140/90, pulse 76 and regular.  HEENT:  Unremarkable.  Pupils are equal, round, and reactive.  Conjunctivae are  clear.  Funduscopic exam was benign.  Oropharynx was clear.  NECK:  Without JVD, adenopathy, or bruits.  LUNGS:  Bibasilar rales one-third of the way up bilaterally.  CARDIAC:  Regular rate and rhythm.  Normal S1 and S2 without gallops, murmurs, r rubs.  He has normal PMI.  ABDOMEN:  Soft and nontender without masses or bruits.  EXTREMITIES:  Femoral and pedal pulses are palpable 2+.  He has trace edema.  NEUROLOGIC:  Nonfocal.  LABORATORY DATA:  pH 7.43, pCO2 37, bicarb 25.  Sodium 141, potassium 4.3, chloride 110, BUN 23, glucose 120.  Hemoglobin 16, white count 11,500, platelets 181,000.  PT is 16.3 with an INR of 1.6.  CK 83.  Chest x-ray shows pulmonary edema.  ECG shows normal sinus rhythm with evidence of old anteroseptal and inferior MIs with no acute changes.  IMPRESSION:  1. New onset of congestive heart failure.  The patient has evidence of prior     myocardial infarction by both electrocardiogram and echocardiogram, with at     least moderate left ventricular dysfunction.  2. Prior anteroseptal myocardial infarction.  Possible inferior myocardial     infarction, age undetermined.  3. Hypertension.  4. Hypercholesterolemia.  5. Tobacco abuse.  6. Arthritis.  PLAN:  The patient will be admitted and ruled out for myocardial infarction. He will be treated with IV  diuresis.  We will hold his Celebrex.  We will add an ACE inhibitor.  He will be given vitamin K today and if his protime is adequate in he morning we will proceed with cardiac catheterization. DD:  05/14/99 TD:  05/14/99 Job: [id number]

## 2010-10-02 NOTE — Discharge Summary (Signed)
Jay Guzman, Jay Guzman             ACCOUNT NO.:  [id number]   MEDICAL RECORD NO.:  [id number]          PATIENT TYPE:  INP   LOCATION:  2040                         FACILITY:  [facility]   PHYSICIAN:  Janetta Hora. Fields, MD  DATE OF BIRTH:  1933-04-12   DATE OF ADMISSION:  06/22/2006  DATE OF DISCHARGE:  06/27/2006                               DISCHARGE SUMMARY   PRIMARY ADMITTING DIAGNOSIS:  Abdominal aortic aneurysm.   ADDITIONAL/DISCHARGE DIAGNOSES:  1. Abdominal aortic aneurysm.  2. Coronary artery disease status post myocardial infarction.  3. History of congestive heart failure.  4. Hypertension.  5. Hyperlipidemia.  6. Ongoing tobacco abuse.   PROCEDURE PERFORMED:  Repair of abdominal aortic aneurysm with 18-mm  Dacron Hemashield straight graft.   HISTORY:  The patient is a 77 year old male who recently became  established with Dr. Arlyce Dice at [facility].  He  previously had a history of an incidental 2.5-cm abdominal aortic  aneurysm by CT scan, and Dr. Arlyce Dice recommended repeating a CT scan at  this time, in order to reassess the aneurysm.  The CT confirmed a 6.1-cm  juxta-renal AAA.  The patient has been completely asymptomatic but was  referred to Dr. Fabienne Bruns for surgical consideration.  Based on the  size of his aneurysm, Dr. Darrick Penna recommended surgical intervention, and  he subsequently underwent a CT angiogram in order to delineate his  anatomy for possible stent graft repair.  Because the aneurysm neck was  shortened and he had bilateral dual renal artery systems, he was not  felt to be a good candidate for endovascular repair.  He recommended  proceeding with open repair at this time, and the patient agreed to  proceed.   HOSPITAL COURSE:  Jay Guzman was admitted to [facility] on  June 22, 2006 and underwent repair of his abdominal aortic aneurysm,  as described in detail above.  He tolerated the procedure well and was  transferred to the SICU in stable condition.  He was able to be  extubated intra-operatively and was transferred to the SICU in stable  condition.  Postoperatively, he has done well.  His NG tube was removed  on post-op day, and #1 he was transferred to the floor.  His bowel  function has slowly returned, and his diet has been advanced  accordingly.  Presently, he is tolerating a regular soft diet without  difficulty and is having bowel movements and passing flatus without  trouble.  He is ambulating the halls without difficulty.  His incisions  are all healing well.  He has remained afebrile, and all vital signs  have been stable during his admission.   LABORATORY:  His most recent labs show a sodium of 132, potassium 3.9,  BUN 8, creatinine 0.9, hemoglobin 14.7, hematocrit 41.9, platelets 101,  white count 13.3.  He is anticipating that, within the next 24-48 hours,  he will hopefully be ready for discharge home, provided he  continues to  tolerate his diet without problem.   DISCHARGE MEDICATIONS:  Are as follows:  1. Tylox 1-2 q.4 h. p.r.n. for pain.  2. Toprol XL 50 mg daily.  3. Lasix 20 mg daily.  4. Altace 10 mg daily.  5. Vytorin 10/40 daily.  6. Aspirin 325 mg daily.  7. Coumadin will most likely be resumed as an outpatient, and will      need to be followed up by Dr. Arlyce Dice.   DISCHARGE INSTRUCTIONS:  He is asked to refrain from driving, heavy  lifting or strenuous activity.  He may continue ambulating daily and  using his incentive spirometer.  He may shower daily and clean his  incisions with soap and water.  He will continue a regular diet.   DISCHARGE FOLLOWUP:  He will see Dr. Darrick Penna in the office in 3 weeks for  recheck and will return for a office visit with a nurse in 1 week for  staple removal and wound recheck.  If he experiences any problems or has  questions in the interim, he is asked to contact our office immediately.      Coral Ceo, P.A.       Janetta Hora. Fields, MD  Electronically Signed    GC/MEDQ  D:  06/26/2006  T:  06/26/2006  Job:  [id number]   cc:   Teena Irani. Arlyce Dice, M.D.  [place]  Peter M. [place], M.D.

## 2010-10-15 ENCOUNTER — Ambulatory Visit (INDEPENDENT_AMBULATORY_CARE_PROVIDER_SITE_OTHER): Payer: Medicare Other | Admitting: *Deleted

## 2010-10-15 DIAGNOSIS — I219 Acute myocardial infarction, unspecified: Secondary | ICD-10-CM

## 2010-10-15 LAB — POCT INR: INR: 2.1

## 2010-11-12 ENCOUNTER — Ambulatory Visit (INDEPENDENT_AMBULATORY_CARE_PROVIDER_SITE_OTHER): Payer: Medicare Other | Admitting: *Deleted

## 2010-11-12 DIAGNOSIS — I219 Acute myocardial infarction, unspecified: Secondary | ICD-10-CM

## 2010-11-25 ENCOUNTER — Encounter: Payer: Self-pay | Admitting: Cardiology

## 2010-12-10 ENCOUNTER — Ambulatory Visit (INDEPENDENT_AMBULATORY_CARE_PROVIDER_SITE_OTHER): Payer: Medicare Other | Admitting: *Deleted

## 2010-12-10 DIAGNOSIS — I219 Acute myocardial infarction, unspecified: Secondary | ICD-10-CM

## 2011-01-01 ENCOUNTER — Other Ambulatory Visit: Payer: Self-pay | Admitting: Cardiology

## 2011-01-01 ENCOUNTER — Other Ambulatory Visit: Payer: Self-pay | Admitting: *Deleted

## 2011-01-01 DIAGNOSIS — E079 Disorder of thyroid, unspecified: Secondary | ICD-10-CM

## 2011-01-01 MED ORDER — FUROSEMIDE 40 MG PO TABS: 40.0000 mg | ORAL_TABLET | Freq: Every day | ORAL | Status: DC

## 2011-01-01 NOTE — Telephone Encounter (Signed)
Wife called requesting samples of vytorin 10/40 and to get refill on Furosemide.have samples available. Will pick up. Rx sent to [facility]

## 2011-01-01 NOTE — Telephone Encounter (Signed)
Patient needs refill of furosomide.  Also vytorin samples.  Please call.

## 2011-01-08 ENCOUNTER — Ambulatory Visit (INDEPENDENT_AMBULATORY_CARE_PROVIDER_SITE_OTHER): Payer: Medicare Other | Admitting: *Deleted

## 2011-01-08 DIAGNOSIS — Z7901 Long term (current) use of anticoagulants: Secondary | ICD-10-CM | POA: Insufficient documentation

## 2011-01-08 DIAGNOSIS — I219 Acute myocardial infarction, unspecified: Secondary | ICD-10-CM

## 2011-01-08 LAB — POCT INR: INR: 2.6

## 2011-02-05 ENCOUNTER — Encounter: Payer: Medicare Other | Admitting: *Deleted

## 2011-02-05 ENCOUNTER — Ambulatory Visit (INDEPENDENT_AMBULATORY_CARE_PROVIDER_SITE_OTHER): Payer: Medicare Other | Admitting: *Deleted

## 2011-02-05 DIAGNOSIS — I219 Acute myocardial infarction, unspecified: Secondary | ICD-10-CM

## 2011-02-05 LAB — POCT INR: INR: 2.6

## 2011-02-17 ENCOUNTER — Encounter: Payer: Self-pay | Admitting: Cardiology

## 2011-02-18 ENCOUNTER — Ambulatory Visit (INDEPENDENT_AMBULATORY_CARE_PROVIDER_SITE_OTHER): Payer: Medicare Other | Admitting: *Deleted

## 2011-02-18 ENCOUNTER — Ambulatory Visit (INDEPENDENT_AMBULATORY_CARE_PROVIDER_SITE_OTHER): Payer: Medicare Other | Admitting: Cardiology

## 2011-02-18 ENCOUNTER — Encounter: Payer: Self-pay | Admitting: Cardiology

## 2011-02-18 ENCOUNTER — Ambulatory Visit: Payer: Medicare Other | Admitting: *Deleted

## 2011-02-18 VITALS — BP 120/70 | HR 62 | Ht 69.0 in | Wt 184.8 lb

## 2011-02-18 DIAGNOSIS — F17200 Nicotine dependence, unspecified, uncomplicated: Secondary | ICD-10-CM

## 2011-02-18 DIAGNOSIS — I10 Essential (primary) hypertension: Secondary | ICD-10-CM

## 2011-02-18 DIAGNOSIS — I219 Acute myocardial infarction, unspecified: Secondary | ICD-10-CM

## 2011-02-18 DIAGNOSIS — I2510 Atherosclerotic heart disease of native coronary artery without angina pectoris: Secondary | ICD-10-CM

## 2011-02-18 DIAGNOSIS — E079 Disorder of thyroid, unspecified: Secondary | ICD-10-CM

## 2011-02-18 DIAGNOSIS — I513 Intracardiac thrombosis, not elsewhere classified: Secondary | ICD-10-CM

## 2011-02-18 DIAGNOSIS — Z720 Tobacco use: Secondary | ICD-10-CM

## 2011-02-18 DIAGNOSIS — I259 Chronic ischemic heart disease, unspecified: Secondary | ICD-10-CM

## 2011-02-18 NOTE — Assessment & Plan Note (Signed)
He is on chronic Coumadin therapy. We will check an INR today.

## 2011-02-18 NOTE — Assessment & Plan Note (Signed)
He is having no active anginal symptoms. He is on appropriate medical therapy. We will follow up lab work today including INR and chemistries and lipid panel.

## 2011-02-18 NOTE — Patient Instructions (Addendum)
We will call with the results of your lab work today.  I recommend you quit smoking.   I encourage you to walk more.  Continue your current medication.  I will see you again in 6 months.

## 2011-02-18 NOTE — Assessment & Plan Note (Signed)
We discussed smoking cessation strategies. He is really not interested in quitting at this point. We spent 5 minutes talking about the importance of this for his long-term management.

## 2011-02-18 NOTE — Progress Notes (Signed)
HPI This was seen for six-month followup today. He has a history of coronary disease with remote anterior myocardial infarction. Cardiac catheterization in 2000 showed an occluded LAD. He also has history of apical mural thrombus and has been on chronic Coumadin therapy. Since his last visit he denies any new cardiac complaints. He denies any chest pain or shortness of breath. He continues to smoke. He states that the nicotine replacement products caused him to be nauseated. He has had no edema. He denies any TIA or CVA symptoms. Allergies  Allergen Reactions  . Codeine   . Morphine And Related     Current Outpatient Prescriptions on File Prior to Visit  Medication Sig Dispense Refill  . aspirin 325 MG tablet Take 325 mg by mouth daily.        Marland Kitchen ezetimibe-simvastatin (VYTORIN) 10-40 MG per tablet Take 1 tablet by mouth at bedtime.        . furosemide (LASIX) 40 MG tablet Take 1 tablet (40 mg total) by mouth daily. 1/2 DAILY  90 tablet  3  . GLUCOSAMINE PO Take by mouth 3 (three) times daily.        . Lansoprazole (PREVACID PO) Take by mouth as needed.        . metoprolol (TOPROL-XL) 50 MG 24 hr tablet Take 50 mg by mouth daily.        . nitroGLYCERIN (NITROSTAT) 0.4 MG SL tablet Place 0.4 mg under the tongue every 5 (five) minutes as needed.        . ramipril (ALTACE) 10 MG tablet Take 10 mg by mouth daily.        Marland Kitchen warfarin (COUMADIN) 5 MG tablet Take by mouth daily.        Marland Kitchen zolpidem (AMBIEN) 5 MG tablet Take 5 mg by mouth at bedtime as needed.          Past Medical History  Diagnosis Date  . Coronary disease     WITH REMOTE ANTERIOR MYOCARDIAL INFARCTION  . Mural thrombus of heart     CHRONIC  . Hypertension   . Hyperlipidemia   . CHF (congestive heart failure)     Past Surgical History  Procedure Date  . Cardiac catheterization 2000  . Hernia repair     STATUS POST BILATERAL  . Abdominal aortic aneurysm repair 2008    Family History  Problem Relation Age of Onset  .  Cerebral aneurysm Father   . Cancer Sister   . Cancer Mother     History   Social History  . Marital Status: Married    Spouse Name: N/A    Number of Children: 2  . 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 Smoking status: Current Everyday Smoker -- 1.0 packs/day for 60 years    Types: Cigarettes  . Smokeless tobacco: Not on file  . Alcohol Use: No  . Drug Use: No  . Sexually Active:    Other Topics Concern  . Not on file   Social History Narrative  . No narrative on file    ROS HPI  Allergies  Allergen Reactions  . Codeine   . Morphine And Related     Current Outpatient Prescriptions on File Prior to Visit  Medication Sig Dispense Refill  . aspirin 325 MG tablet Take 325 mg by mouth daily.        Marland Kitchen ezetimibe-simvastatin (VYTORIN) 10-40 MG per tablet Take 1  tablet by mouth at bedtime.        . furosemide (LASIX) 40 MG tablet Take 1 tablet (40 mg total) by mouth daily. 1/2 DAILY  90 tablet  3  . GLUCOSAMINE PO Take by mouth 3 (three) times daily.        . Lansoprazole (PREVACID PO) Take by mouth as needed.        . metoprolol (TOPROL-XL) 50 MG 24 hr tablet Take 50 mg by mouth daily.        . nitroGLYCERIN (NITROSTAT) 0.4 MG SL tablet Place 0.4 mg under the tongue every 5 (five) minutes as needed.        . ramipril (ALTACE) 10 MG tablet Take 10 mg by mouth daily.        Marland Kitchen warfarin (COUMADIN) 5 MG tablet Take by mouth daily.        Marland Kitchen zolpidem (AMBIEN) 5 MG tablet Take 5 mg by mouth at bedtime as needed.          Past Medical History  Diagnosis Date  . Coronary disease     WITH REMOTE ANTERIOR MYOCARDIAL INFARCTION  . Mural thrombus of heart     CHRONIC  . Hypertension   . Hyperlipidemia   . CHF (congestive heart failure)     Past Surgical History  Procedure Date  . Cardiac catheterization 2000  . Hernia repair     STATUS POST BILATERAL  . Abdominal aortic aneurysm repair 2008    Family  History  Problem Relation Age of Onset  . Cerebral aneurysm Father   . Cancer Sister   . Cancer Mother     History   Social History  . Marital Status: Married    Spouse Name: N/A    Number of Children: 2  . 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 Smoking status: Current Everyday Smoker -- 1.0 packs/day for 60 years    Types: Cigarettes  . Smokeless tobacco: Not on file  . Alcohol Use: No  . Drug Use: No  . Sexually Active:    Other Topics Concern  . Not on file   Social History Narrative  . No narrative on file    ROS  as noted in history of present illness. All other systems were reviewed and are negative.  PHYSICAL EXAM BP 120/70  Pulse 62  Ht 5\' 9"  (1.753 m)  Wt 184 lb 12.8 oz (83.825 kg)  BMI 27.29 kg/m2 He is an elderly, chronically ill-appearing white male in no acute distress. HEENT exam reveals he is normocephalic, atraumatic. His pupils are equal and reactive. Sclera are clear. Oropharynx is clear. Neck is supple without JVD, adenopathy, thyromegaly, or bruits. Lungs are clear. Cardiac exam reveals a regular rate and rhythm without gallop or murmur. Abdomen is soft and nontender without masses or bruits. Femoral and pedal pulses are 2+ and symmetric. He has no edema. Skin is warm and dry. Patient is alert and oriented x3. He does have chronic memory loss. Gait is normal.  Laboratory data: ECG today demonstrates normal sinus rhythm with occasional PVCs. There is left axis deviation. There is evidence of old inferior and anterolateral infarcts. This is unchanged compared to 2008.  ASSESSMENT AND PLAN

## 2011-02-18 NOTE — Assessment & Plan Note (Signed)
He is having no symptoms of CHF. He has no evidence of volume overload on exam. We'll continue with his current doses of metoprolol, ACE inhibitor, and diuretics. We'll follow up on complete lab work today. He is to continue with his sodium restriction. And encouraged him to increase his aerobic activity. I will see him back again in 6 months.

## 2011-02-22 ENCOUNTER — Telehealth: Payer: Self-pay | Admitting: *Deleted

## 2011-02-22 NOTE — Telephone Encounter (Signed)
Notified wife of lab results. 

## 2011-02-22 NOTE — Telephone Encounter (Signed)
Message copied by Lorayne Bender on Mon Feb 22, 2011 12:20 PM ------      Message from: Guzman, Jay M      Created: Thu Feb 18, 2011  5:37 PM       Thyroid studies are normal.      Jay Guzman

## 2011-03-18 ENCOUNTER — Ambulatory Visit (INDEPENDENT_AMBULATORY_CARE_PROVIDER_SITE_OTHER): Payer: Medicare Other | Admitting: *Deleted

## 2011-03-18 DIAGNOSIS — I219 Acute myocardial infarction, unspecified: Secondary | ICD-10-CM

## 2011-03-18 DIAGNOSIS — Z7901 Long term (current) use of anticoagulants: Secondary | ICD-10-CM

## 2011-03-18 DIAGNOSIS — I513 Intracardiac thrombosis, not elsewhere classified: Secondary | ICD-10-CM

## 2011-04-15 ENCOUNTER — Ambulatory Visit (INDEPENDENT_AMBULATORY_CARE_PROVIDER_SITE_OTHER): Payer: Medicare Other | Admitting: *Deleted

## 2011-04-15 DIAGNOSIS — I513 Intracardiac thrombosis, not elsewhere classified: Secondary | ICD-10-CM

## 2011-04-15 DIAGNOSIS — I219 Acute myocardial infarction, unspecified: Secondary | ICD-10-CM

## 2011-04-15 DIAGNOSIS — Z7901 Long term (current) use of anticoagulants: Secondary | ICD-10-CM

## 2011-05-13 ENCOUNTER — Ambulatory Visit (INDEPENDENT_AMBULATORY_CARE_PROVIDER_SITE_OTHER): Payer: Medicare Other | Admitting: *Deleted

## 2011-05-13 DIAGNOSIS — I219 Acute myocardial infarction, unspecified: Secondary | ICD-10-CM

## 2011-05-13 DIAGNOSIS — I513 Intracardiac thrombosis, not elsewhere classified: Secondary | ICD-10-CM

## 2011-05-13 DIAGNOSIS — Z7901 Long term (current) use of anticoagulants: Secondary | ICD-10-CM

## 2011-05-13 LAB — POCT INR: INR: 2.8

## 2011-06-08 ENCOUNTER — Other Ambulatory Visit: Payer: Self-pay | Admitting: Cardiology

## 2011-06-08 MED ORDER — WARFARIN SODIUM 5 MG PO TABS: 5.0000 mg | ORAL_TABLET | Freq: Every day | ORAL | Status: DC

## 2011-06-09 ENCOUNTER — Other Ambulatory Visit: Payer: Self-pay | Admitting: Cardiology

## 2011-06-09 MED ORDER — WARFARIN SODIUM 5 MG PO TABS: 5.0000 mg | ORAL_TABLET | Freq: Every day | ORAL | Status: DC

## 2011-06-10 ENCOUNTER — Ambulatory Visit (INDEPENDENT_AMBULATORY_CARE_PROVIDER_SITE_OTHER): Payer: Medicare Other | Admitting: *Deleted

## 2011-06-10 DIAGNOSIS — I219 Acute myocardial infarction, unspecified: Secondary | ICD-10-CM

## 2011-06-10 DIAGNOSIS — I513 Intracardiac thrombosis, not elsewhere classified: Secondary | ICD-10-CM

## 2011-06-10 DIAGNOSIS — Z7901 Long term (current) use of anticoagulants: Secondary | ICD-10-CM

## 2011-06-10 LAB — POCT INR: INR: 3.1

## 2011-07-06 ENCOUNTER — Other Ambulatory Visit: Payer: Self-pay | Admitting: Cardiology

## 2011-07-06 MED ORDER — METOPROLOL SUCCINATE ER 50 MG PO TB24: 50.0000 mg | ORAL_TABLET | Freq: Every day | ORAL | Status: DC

## 2011-07-06 NOTE — Telephone Encounter (Signed)
New Refill    Patient wife Jay Guzman verified pharmacy [facility], she can be reached at hm# for any additional info

## 2011-07-12 ENCOUNTER — Ambulatory Visit (INDEPENDENT_AMBULATORY_CARE_PROVIDER_SITE_OTHER): Payer: Medicare Other | Admitting: Pharmacist

## 2011-07-12 DIAGNOSIS — I219 Acute myocardial infarction, unspecified: Secondary | ICD-10-CM

## 2011-07-12 DIAGNOSIS — I513 Intracardiac thrombosis, not elsewhere classified: Secondary | ICD-10-CM

## 2011-07-12 DIAGNOSIS — Z7901 Long term (current) use of anticoagulants: Secondary | ICD-10-CM

## 2011-08-18 ENCOUNTER — Ambulatory Visit (INDEPENDENT_AMBULATORY_CARE_PROVIDER_SITE_OTHER): Payer: Medicare Other | Admitting: *Deleted

## 2011-08-18 ENCOUNTER — Ambulatory Visit (INDEPENDENT_AMBULATORY_CARE_PROVIDER_SITE_OTHER): Payer: Medicare Other | Admitting: Cardiology

## 2011-08-18 ENCOUNTER — Encounter: Payer: Self-pay | Admitting: Cardiology

## 2011-08-18 ENCOUNTER — Telehealth: Payer: Self-pay

## 2011-08-18 VITALS — BP 120/56 | HR 60 | Ht 69.5 in | Wt 180.8 lb

## 2011-08-18 DIAGNOSIS — I513 Intracardiac thrombosis, not elsewhere classified: Secondary | ICD-10-CM

## 2011-08-18 DIAGNOSIS — I509 Heart failure, unspecified: Secondary | ICD-10-CM

## 2011-08-18 DIAGNOSIS — E785 Hyperlipidemia, unspecified: Secondary | ICD-10-CM

## 2011-08-18 DIAGNOSIS — Z7901 Long term (current) use of anticoagulants: Secondary | ICD-10-CM

## 2011-08-18 DIAGNOSIS — Z720 Tobacco use: Secondary | ICD-10-CM

## 2011-08-18 DIAGNOSIS — I2109 ST elevation (STEMI) myocardial infarction involving other coronary artery of anterior wall: Secondary | ICD-10-CM

## 2011-08-18 DIAGNOSIS — F17200 Nicotine dependence, unspecified, uncomplicated: Secondary | ICD-10-CM

## 2011-08-18 DIAGNOSIS — I219 Acute myocardial infarction, unspecified: Secondary | ICD-10-CM

## 2011-08-18 DIAGNOSIS — I2510 Atherosclerotic heart disease of native coronary artery without angina pectoris: Secondary | ICD-10-CM

## 2011-08-18 DIAGNOSIS — I259 Chronic ischemic heart disease, unspecified: Secondary | ICD-10-CM

## 2011-08-18 LAB — POCT INR: INR: 2.6

## 2011-08-18 NOTE — Progress Notes (Signed)
HPI Jay Guzman was seen for six-month followup today. He has a history of coronary disease with remote anterior myocardial infarction. Cardiac catheterization in 2000 showed an occluded LAD. He also has history of apical mural thrombus and has been on chronic Coumadin therapy. He denies any significant cardiac complaints. He does get short of breath at times and continues to smoke heavily. He denies any significant cough. He has had no chest pain. He states he is still down in his back from a fall that he suffered one year ago. Allergies  Allergen Reactions  . Codeine   . Morphine And Related     Current Outpatient Prescriptions on File Prior to Visit  Medication Sig Dispense Refill  . aspirin 325 MG tablet Take 325 mg by mouth daily.        Marland Kitchen ezetimibe-simvastatin (VYTORIN) 10-40 MG per tablet Take 1 tablet by mouth at bedtime.        . furosemide (LASIX) 40 MG tablet Take 1 tablet (40 mg total) by mouth daily. 1/2 DAILY  90 tablet  3  . GLUCOSAMINE PO Take by mouth 3 (three) times daily.        . Lansoprazole (PREVACID PO) Take by mouth as needed.        . metoprolol succinate (TOPROL-XL) 50 MG 24 hr tablet Take 1 tablet (50 mg total) by mouth daily.  90 tablet  2  . nitroGLYCERIN (NITROSTAT) 0.4 MG SL tablet Place 0.4 mg under the tongue every 5 (five) minutes as needed.        . ramipril (ALTACE) 10 MG tablet Take 10 mg by mouth daily.        Marland Kitchen warfarin (COUMADIN) 5 MG tablet Take 1 tablet (5 mg total) by mouth daily. Take as directed from coumadin clinic  50 tablet  3  . zolpidem (AMBIEN) 5 MG tablet Take 5 mg by mouth at bedtime as needed.          Past Medical History  Diagnosis Date  . Coronary disease     WITH REMOTE ANTERIOR MYOCARDIAL INFARCTION  . Mural thrombus of heart     CHRONIC  . Hypertension   . Hyperlipidemia   . CHF (congestive heart failure)     Past Surgical History  Procedure Date  . Cardiac catheterization 2000  . Hernia repair     STATUS POST BILATERAL  .  Abdominal aortic aneurysm repair 2008    Family History  Problem Relation Age of Onset  . Cerebral aneurysm Father   . Cancer Sister   . Cancer Mother     History   Social History  . Marital Status: Married    Spouse Name: N/A    Number of Children: 2  . 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 Smoking status: Current Everyday Smoker -- 1.0 packs/day for 60 years    Types: Cigarettes  . Smokeless tobacco: Not on file  . Alcohol Use: No  . Drug Use: No  . Sexually Active:    Other Topics Concern  . Not on file   Social History Narrative  . No narrative on file    ROS HPI  Allergies  Allergen Reactions  . Codeine   . Morphine And Related     Current Outpatient Prescriptions on File Prior to Visit  Medication Sig Dispense Refill  . aspirin 325 MG tablet Take 325 mg by mouth daily.        Marland Kitchen  ezetimibe-simvastatin (VYTORIN) 10-40 MG per tablet Take 1 tablet by mouth at bedtime.        . furosemide (LASIX) 40 MG tablet Take 1 tablet (40 mg total) by mouth daily. 1/2 DAILY  90 tablet  3  . GLUCOSAMINE PO Take by mouth 3 (three) times daily.        . Lansoprazole (PREVACID PO) Take by mouth as needed.        . metoprolol succinate (TOPROL-XL) 50 MG 24 hr tablet Take 1 tablet (50 mg total) by mouth daily.  90 tablet  2  . nitroGLYCERIN (NITROSTAT) 0.4 MG SL tablet Place 0.4 mg under the tongue every 5 (five) minutes as needed.        . ramipril (ALTACE) 10 MG tablet Take 10 mg by mouth daily.        Marland Kitchen warfarin (COUMADIN) 5 MG tablet Take 1 tablet (5 mg total) by mouth daily. Take as directed from coumadin clinic  50 tablet  3  . zolpidem (AMBIEN) 5 MG tablet Take 5 mg by mouth at bedtime as needed.          Past Medical History  Diagnosis Date  . Coronary disease     WITH REMOTE ANTERIOR MYOCARDIAL INFARCTION  . Mural thrombus of heart     CHRONIC  . Hypertension   . Hyperlipidemia   . CHF (congestive  heart failure)     Past Surgical History  Procedure Date  . Cardiac catheterization 2000  . Hernia repair     STATUS POST BILATERAL  . Abdominal aortic aneurysm repair 2008    Family History  Problem Relation Age of Onset  . Cerebral aneurysm Father   . Cancer Sister   . Cancer Mother     History   Social History  . Marital Status: Married    Spouse Name: N/A    Number of Children: 2  . 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 Smoking status: Current Everyday Smoker -- 1.0 packs/day for 60 years    Types: Cigarettes  . Smokeless tobacco: Not on file  . Alcohol Use: No  . Drug Use: No  . Sexually Active:    Other Topics Concern  . Not on file   Social History Narrative  . No narrative on file    ROS  as noted in history of present illness. All other systems were reviewed and are negative.  PHYSICAL EXAM BP 120/56  Pulse 60  Ht 5' 9.5" (1.765 m)  Wt 180 lb 12.8 oz (82.01 kg)  BMI 26.32 kg/m2 He is an elderly, chronically ill-appearing white male in no acute distress. HEENT exam reveals he is normocephalic, atraumatic. His pupils are equal and reactive. Sclera are clear. Oropharynx is clear. Neck is supple without JVD, adenopathy, thyromegaly, or bruits. Lungs reveal scattered rhonchi. Cardiac exam reveals a regular rate and rhythm without gallop or murmur. Abdomen is soft and nontender without masses or bruits. Femoral and pedal pulses are 2+ and symmetric. He has no edema. Skin is warm and dry. Patient is alert and oriented x3. He does have chronic memory loss. Gait is normal.  Laboratory data: ECG today demonstrates normal sinus rhythm with occasional PVCs. There is left axis deviation. There is evidence of old inferior and anterolateral infarcts. This is unchanged compared to 2008.  ASSESSMENT AND PLAN

## 2011-08-18 NOTE — Telephone Encounter (Signed)
Patient's wife was called and told patient should not be taking advil or aleve,no nsaids.

## 2011-08-18 NOTE — Assessment & Plan Note (Signed)
He has history of moderate left ventricular dysfunction. He is well compensated on beta blocker and ACE inhibitor therapy. He is also on Lasix daily 20 mg.

## 2011-08-18 NOTE — Assessment & Plan Note (Signed)
I once again counseled him on the importance of smoking cessation. He is not ready to quit at this time.

## 2011-08-18 NOTE — Assessment & Plan Note (Signed)
He has no significant anginal symptoms. We'll continue with aspirin and metoprolol therapy. He is on chronic Coumadin therapy for history of apical mural thrombus and his INR is therapeutic today.

## 2011-08-18 NOTE — Assessment & Plan Note (Signed)
He is on chronic Vytorin therapy. It has been a year since his last lipid panel. We will schedule him for fasting lab work including chemistries and lipid panel.

## 2011-08-18 NOTE — Patient Instructions (Signed)
We will schedule for fasting lab work.  Continue your current medication.  Stop smoking!  I will see you again in 6 months.

## 2011-09-15 ENCOUNTER — Other Ambulatory Visit (INDEPENDENT_AMBULATORY_CARE_PROVIDER_SITE_OTHER): Payer: Medicare Other

## 2011-09-15 ENCOUNTER — Ambulatory Visit (INDEPENDENT_AMBULATORY_CARE_PROVIDER_SITE_OTHER): Payer: Medicare Other | Admitting: *Deleted

## 2011-09-15 DIAGNOSIS — E785 Hyperlipidemia, unspecified: Secondary | ICD-10-CM

## 2011-09-15 DIAGNOSIS — I2109 ST elevation (STEMI) myocardial infarction involving other coronary artery of anterior wall: Secondary | ICD-10-CM

## 2011-09-15 DIAGNOSIS — Z7901 Long term (current) use of anticoagulants: Secondary | ICD-10-CM

## 2011-09-15 DIAGNOSIS — I513 Intracardiac thrombosis, not elsewhere classified: Secondary | ICD-10-CM

## 2011-09-15 DIAGNOSIS — Z720 Tobacco use: Secondary | ICD-10-CM

## 2011-09-15 DIAGNOSIS — F17200 Nicotine dependence, unspecified, uncomplicated: Secondary | ICD-10-CM

## 2011-09-15 DIAGNOSIS — I2510 Atherosclerotic heart disease of native coronary artery without angina pectoris: Secondary | ICD-10-CM

## 2011-09-15 DIAGNOSIS — I219 Acute myocardial infarction, unspecified: Secondary | ICD-10-CM

## 2011-09-15 LAB — LIPID PANEL
Cholesterol: 111 mg/dL (ref 0–200)
HDL: 40.3 mg/dL (ref >39.00)
Total CHOL/HDL Ratio: 3
Triglycerides: 71 mg/dL (ref 0.0–149.0)

## 2011-09-15 LAB — HEPATIC FUNCTION PANEL
Bilirubin, Direct: 0.2 mg/dL (ref 0.0–0.3)
Total Bilirubin: 1.6 mg/dL — ABNORMAL HIGH (ref 0.3–1.2)
Total Protein: 6.3 g/dL (ref 6.0–8.3)

## 2011-09-15 LAB — BASIC METABOLIC PANEL
BUN: 20 mg/dL (ref 6–23)
CO2: 29 mEq/L (ref 19–32)
Calcium: 8.6 mg/dL (ref 8.4–10.5)
Creatinine, Ser: 1.2 mg/dL (ref 0.4–1.5)

## 2011-10-05 ENCOUNTER — Other Ambulatory Visit: Payer: Self-pay | Admitting: Cardiology

## 2011-10-05 MED ORDER — RAMIPRIL 10 MG PO TABS: 10.0000 mg | ORAL_TABLET | Freq: Every day | ORAL | Status: DC

## 2011-10-05 NOTE — Telephone Encounter (Signed)
Refill- Ramipril   Verified preferred as [facility], patient can be reached at [phone] for additional info.

## 2011-10-22 ENCOUNTER — Telehealth: Payer: Self-pay | Admitting: Cardiology

## 2011-10-22 NOTE — Telephone Encounter (Signed)
Patient called wanting to know if he can take aleve or advil for his lower back pain.Patient was told he is on coumadin and he should take tylenol.Message sent to Dr.Jordan for his review.

## 2011-10-22 NOTE — Telephone Encounter (Signed)
New msg Pt wants to talk to you about advil or aleve for pack pain. Aspirin making stomach upset.

## 2011-10-25 NOTE — Telephone Encounter (Signed)
Agree he needs to avoid NSAIDs. Kingslee Dowse [place] MD, [facility]

## 2011-10-27 ENCOUNTER — Ambulatory Visit (INDEPENDENT_AMBULATORY_CARE_PROVIDER_SITE_OTHER): Payer: Medicare Other | Admitting: *Deleted

## 2011-10-27 DIAGNOSIS — I513 Intracardiac thrombosis, not elsewhere classified: Secondary | ICD-10-CM

## 2011-10-27 DIAGNOSIS — I219 Acute myocardial infarction, unspecified: Secondary | ICD-10-CM

## 2011-10-27 DIAGNOSIS — Z7901 Long term (current) use of anticoagulants: Secondary | ICD-10-CM

## 2011-10-27 LAB — POCT INR: INR: 2.7

## 2011-12-08 ENCOUNTER — Ambulatory Visit (INDEPENDENT_AMBULATORY_CARE_PROVIDER_SITE_OTHER): Payer: Medicare Other | Admitting: *Deleted

## 2011-12-08 DIAGNOSIS — I219 Acute myocardial infarction, unspecified: Secondary | ICD-10-CM

## 2011-12-08 DIAGNOSIS — I513 Intracardiac thrombosis, not elsewhere classified: Secondary | ICD-10-CM

## 2011-12-08 DIAGNOSIS — Z7901 Long term (current) use of anticoagulants: Secondary | ICD-10-CM

## 2011-12-08 LAB — POCT INR: INR: 2.8

## 2011-12-22 ENCOUNTER — Encounter: Payer: Self-pay | Admitting: Vascular Surgery

## 2012-01-19 ENCOUNTER — Ambulatory Visit (INDEPENDENT_AMBULATORY_CARE_PROVIDER_SITE_OTHER): Payer: Medicare Other | Admitting: *Deleted

## 2012-01-19 DIAGNOSIS — Z7901 Long term (current) use of anticoagulants: Secondary | ICD-10-CM

## 2012-01-19 DIAGNOSIS — I219 Acute myocardial infarction, unspecified: Secondary | ICD-10-CM

## 2012-01-19 DIAGNOSIS — I513 Intracardiac thrombosis, not elsewhere classified: Secondary | ICD-10-CM

## 2012-01-21 ENCOUNTER — Other Ambulatory Visit: Payer: Self-pay

## 2012-01-21 MED ORDER — METOPROLOL SUCCINATE ER 50 MG PO TB24: 50.0000 mg | ORAL_TABLET | Freq: Every day | ORAL | Status: DC

## 2012-01-25 ENCOUNTER — Other Ambulatory Visit: Payer: Self-pay | Admitting: *Deleted

## 2012-01-25 ENCOUNTER — Telehealth: Payer: Self-pay | Admitting: Cardiology

## 2012-01-25 NOTE — Telephone Encounter (Signed)
Pt's insurance changed to [facility] , mail order will take 2 weeks, metoprolol 50 mg 2 weeks worth, [facility] [address], pls call [phone]

## 2012-01-25 NOTE — Telephone Encounter (Signed)
Opened in Error.

## 2012-01-26 ENCOUNTER — Other Ambulatory Visit: Payer: Self-pay

## 2012-01-26 MED ORDER — METOPROLOL SUCCINATE ER 50 MG PO TB24: 50.0000 mg | ORAL_TABLET | Freq: Every day | ORAL | Status: DC

## 2012-02-01 NOTE — Telephone Encounter (Signed)
New problem:  Wants to know can he take fish oil.

## 2012-02-01 NOTE — Telephone Encounter (Signed)
Pt is aware that it is fine for him to take fish oil medication. Pt 's wife said pt got the Metoprolol 50 mg 2 weeks supply as requested.

## 2012-02-15 ENCOUNTER — Other Ambulatory Visit: Payer: Self-pay | Admitting: Cardiology

## 2012-02-15 MED ORDER — WARFARIN SODIUM 5 MG PO TABS: ORAL_TABLET | ORAL | Status: DC

## 2012-02-15 NOTE — Telephone Encounter (Signed)
Refill- Warfarin (COUMADIN) 5 MG tablet, verified preffered [facility]    Patient would also like to request Vitorin RX samples, please return call to patient at hm#  To advise

## 2012-02-16 ENCOUNTER — Other Ambulatory Visit: Payer: Self-pay | Admitting: Cardiology

## 2012-02-16 NOTE — Telephone Encounter (Signed)
Sample of vytorin  10-40 mg.

## 2012-02-16 NOTE — Telephone Encounter (Signed)
Called pharmacy and they have prescription that was ordered on February 15 2012

## 2012-02-18 ENCOUNTER — Other Ambulatory Visit: Payer: Self-pay | Admitting: *Deleted

## 2012-02-18 MED ORDER — WARFARIN SODIUM 5 MG PO TABS: ORAL_TABLET | ORAL | Status: DC

## 2012-03-01 ENCOUNTER — Other Ambulatory Visit: Payer: Self-pay | Admitting: *Deleted

## 2012-03-01 ENCOUNTER — Ambulatory Visit (INDEPENDENT_AMBULATORY_CARE_PROVIDER_SITE_OTHER): Payer: Medicare Other | Admitting: *Deleted

## 2012-03-01 DIAGNOSIS — I219 Acute myocardial infarction, unspecified: Secondary | ICD-10-CM

## 2012-03-01 DIAGNOSIS — I513 Intracardiac thrombosis, not elsewhere classified: Secondary | ICD-10-CM

## 2012-03-01 DIAGNOSIS — Z7901 Long term (current) use of anticoagulants: Secondary | ICD-10-CM

## 2012-03-01 MED ORDER — EZETIMIBE-SIMVASTATIN 10-40 MG PO TABS: 1.0000 | ORAL_TABLET | Freq: Every day | ORAL | Status: DC

## 2012-03-06 ENCOUNTER — Telehealth: Payer: Self-pay | Admitting: Cardiology

## 2012-03-06 NOTE — Telephone Encounter (Signed)
New problem:  Sample of Vytorin  10-40- mg.

## 2012-03-06 NOTE — Telephone Encounter (Signed)
Mrs Heffley states that Mr Ching only has 14 pills left and needs more samples.  Samples left at the front desk.

## 2012-04-04 ENCOUNTER — Other Ambulatory Visit: Payer: Self-pay | Admitting: *Deleted

## 2012-04-04 MED ORDER — RAMIPRIL 10 MG PO TABS: 10.0000 mg | ORAL_TABLET | Freq: Every day | ORAL | Status: DC

## 2012-04-04 MED ORDER — METOPROLOL SUCCINATE ER 50 MG PO TB24: 50.0000 mg | ORAL_TABLET | Freq: Every day | ORAL | Status: DC

## 2012-04-10 ENCOUNTER — Other Ambulatory Visit: Payer: Self-pay

## 2012-04-10 MED ORDER — FUROSEMIDE 40 MG PO TABS: 40.0000 mg | ORAL_TABLET | Freq: Every day | ORAL | Status: DC

## 2012-04-10 NOTE — Telephone Encounter (Signed)
  ramipril (ALTACE) 10 MG tablet 90 tablet 1 04/04/2012    Sig - Route:  Take 1 tablet (10 mg total) by mouth daily. - Oral  Class:  Normal  Authorizing Provider:  Peter M [place], MD  Ordering User:  Elmarie Shiley   metoprolol succinate (TOPROL-XL) 50 MG 24 hr tablet  90 tablet  1  04/04/2012        Sig - Route:  Take 1 tablet (50 mg total) by mouth daily. - Oral    Class:  Normal    Authorizing Provider:  Peter M [place], MD    Ordering User:  Elmarie Shiley

## 2012-04-12 ENCOUNTER — Ambulatory Visit (INDEPENDENT_AMBULATORY_CARE_PROVIDER_SITE_OTHER): Payer: Medicare Other | Admitting: *Deleted

## 2012-04-12 DIAGNOSIS — I219 Acute myocardial infarction, unspecified: Secondary | ICD-10-CM

## 2012-04-12 DIAGNOSIS — Z7901 Long term (current) use of anticoagulants: Secondary | ICD-10-CM

## 2012-04-12 DIAGNOSIS — I513 Intracardiac thrombosis, not elsewhere classified: Secondary | ICD-10-CM

## 2012-04-12 LAB — POCT INR: INR: 2.8

## 2012-04-26 ENCOUNTER — Other Ambulatory Visit: Payer: Self-pay | Admitting: Cardiology

## 2012-04-26 MED ORDER — FUROSEMIDE 40 MG PO TABS: ORAL_TABLET | ORAL | Status: DC

## 2012-05-04 ENCOUNTER — Telehealth: Payer: Self-pay | Admitting: Cardiology

## 2012-05-04 MED ORDER — FUROSEMIDE 40 MG PO TABS: ORAL_TABLET | ORAL | Status: DC

## 2012-05-04 NOTE — Telephone Encounter (Signed)
Spoke to patient he stated he has been out of lasix and want receive thru mail order for at least 10 more days.Lasix 40 mg prescription sent to [address]

## 2012-05-04 NOTE — Telephone Encounter (Signed)
Pt's wife called [facility] re furosemide rx not coming for another 5-10 days, pt has been out for two days now, wants to know if he should get a few weeks supply to last until mail order gets here? [facility] [address], pls call [phone]

## 2012-05-04 NOTE — Telephone Encounter (Signed)
Follow-up:    Patient's wife called in to follow up on the initial call made earlier today.  Please call back.

## 2012-05-24 ENCOUNTER — Ambulatory Visit (INDEPENDENT_AMBULATORY_CARE_PROVIDER_SITE_OTHER): Payer: Medicare Other | Admitting: *Deleted

## 2012-05-24 DIAGNOSIS — I219 Acute myocardial infarction, unspecified: Secondary | ICD-10-CM

## 2012-05-24 DIAGNOSIS — Z7901 Long term (current) use of anticoagulants: Secondary | ICD-10-CM

## 2012-05-24 DIAGNOSIS — I513 Intracardiac thrombosis, not elsewhere classified: Secondary | ICD-10-CM

## 2012-05-24 LAB — POCT INR: INR: 2.7

## 2012-07-04 ENCOUNTER — Ambulatory Visit (INDEPENDENT_AMBULATORY_CARE_PROVIDER_SITE_OTHER): Payer: Medicare Other | Admitting: *Deleted

## 2012-07-04 DIAGNOSIS — I513 Intracardiac thrombosis, not elsewhere classified: Secondary | ICD-10-CM

## 2012-07-04 DIAGNOSIS — I219 Acute myocardial infarction, unspecified: Secondary | ICD-10-CM

## 2012-07-04 DIAGNOSIS — Z7901 Long term (current) use of anticoagulants: Secondary | ICD-10-CM

## 2012-07-04 LAB — POCT INR: INR: 2.3

## 2012-08-11 ENCOUNTER — Telehealth: Payer: Self-pay | Admitting: Cardiology

## 2012-08-11 NOTE — Telephone Encounter (Signed)
Walk In pt Form " pt Needs samples of Vytorin"  Sent to [facility] 08/11/12/KM

## 2012-08-15 ENCOUNTER — Ambulatory Visit (INDEPENDENT_AMBULATORY_CARE_PROVIDER_SITE_OTHER): Payer: Medicare Other | Admitting: *Deleted

## 2012-08-15 DIAGNOSIS — I513 Intracardiac thrombosis, not elsewhere classified: Secondary | ICD-10-CM

## 2012-08-15 DIAGNOSIS — Z7901 Long term (current) use of anticoagulants: Secondary | ICD-10-CM

## 2012-08-15 DIAGNOSIS — I219 Acute myocardial infarction, unspecified: Secondary | ICD-10-CM

## 2012-08-16 ENCOUNTER — Telehealth: Payer: Self-pay

## 2012-08-16 NOTE — Telephone Encounter (Signed)
Patient called spoke to wife was told received a request for vytorin samples.Wife stated patient had a INR check last week and was given samples then.Wife was told patient past due for appointment to see Dr.Jordan.Patient never received a reminder letter.States patient is doing good appointment scheduled with Dr.Jordan 10/05/12.

## 2012-09-11 ENCOUNTER — Ambulatory Visit (INDEPENDENT_AMBULATORY_CARE_PROVIDER_SITE_OTHER): Payer: Medicare Other | Admitting: Cardiology

## 2012-09-11 ENCOUNTER — Encounter: Payer: Self-pay | Admitting: Cardiology

## 2012-09-11 ENCOUNTER — Telehealth: Payer: Self-pay | Admitting: Cardiology

## 2012-09-11 ENCOUNTER — Ambulatory Visit (INDEPENDENT_AMBULATORY_CARE_PROVIDER_SITE_OTHER): Payer: Medicare Other | Admitting: Pharmacist

## 2012-09-11 VITALS — BP 140/80 | HR 62 | Ht 69.5 in | Wt 171.8 lb

## 2012-09-11 DIAGNOSIS — E785 Hyperlipidemia, unspecified: Secondary | ICD-10-CM

## 2012-09-11 DIAGNOSIS — Z720 Tobacco use: Secondary | ICD-10-CM

## 2012-09-11 DIAGNOSIS — I513 Intracardiac thrombosis, not elsewhere classified: Secondary | ICD-10-CM

## 2012-09-11 DIAGNOSIS — I2510 Atherosclerotic heart disease of native coronary artery without angina pectoris: Secondary | ICD-10-CM

## 2012-09-11 DIAGNOSIS — I509 Heart failure, unspecified: Secondary | ICD-10-CM

## 2012-09-11 DIAGNOSIS — Z7901 Long term (current) use of anticoagulants: Secondary | ICD-10-CM

## 2012-09-11 DIAGNOSIS — I259 Chronic ischemic heart disease, unspecified: Secondary | ICD-10-CM

## 2012-09-11 DIAGNOSIS — I219 Acute myocardial infarction, unspecified: Secondary | ICD-10-CM

## 2012-09-11 DIAGNOSIS — F17200 Nicotine dependence, unspecified, uncomplicated: Secondary | ICD-10-CM

## 2012-09-11 LAB — LIPID PANEL: Total CHOL/HDL Ratio: 3

## 2012-09-11 LAB — CBC WITH DIFFERENTIAL/PLATELET
Basophils Absolute: 0 10*3/uL (ref 0.0–0.1)
Eosinophils Absolute: 0.2 10*3/uL (ref 0.0–0.7)
Eosinophils Relative: 2.3 % (ref 0.0–5.0)
HCT: 49.7 % (ref 39.0–52.0)
Lymphs Abs: 2.1 10*3/uL (ref 0.7–4.0)
MCHC: 34.7 g/dL (ref 30.0–36.0)
MCV: 97.9 fl (ref 78.0–100.0)
Monocytes Absolute: 0.7 10*3/uL (ref 0.1–1.0)
Platelets: 148 10*3/uL — ABNORMAL LOW (ref 150.0–400.0)
RDW: 14.7 % — ABNORMAL HIGH (ref 11.5–14.6)

## 2012-09-11 LAB — BASIC METABOLIC PANEL
CO2: 29 mEq/L (ref 19–32)
Calcium: 8.7 mg/dL (ref 8.4–10.5)
Creatinine, Ser: 1.3 mg/dL (ref 0.4–1.5)
GFR: 59.44 mL/min — ABNORMAL LOW (ref >60.00)
Glucose, Bld: 90 mg/dL (ref 70–99)

## 2012-09-11 LAB — HEPATIC FUNCTION PANEL
ALT: 17 U/L (ref 0–53)
AST: 27 U/L (ref 0–37)
Alkaline Phosphatase: 56 U/L (ref 39–117)
Bilirubin, Direct: 0.3 mg/dL (ref 0.0–0.3)
Total Bilirubin: 1.8 mg/dL — ABNORMAL HIGH (ref 0.3–1.2)

## 2012-09-11 LAB — POCT INR: INR: 2.7

## 2012-09-11 LAB — TSH: TSH: 4.4 u[IU]/mL (ref 0.35–5.50)

## 2012-09-11 MED ORDER — METOPROLOL SUCCINATE ER 50 MG PO TB24: 50.0000 mg | ORAL_TABLET | Freq: Every day | ORAL | Status: DC

## 2012-09-11 MED ORDER — FUROSEMIDE 40 MG PO TABS: ORAL_TABLET | ORAL | Status: DC

## 2012-09-11 MED ORDER — EZETIMIBE-SIMVASTATIN 10-40 MG PO TABS: 1.0000 | ORAL_TABLET | Freq: Every day | ORAL | Status: DC

## 2012-09-11 NOTE — Patient Instructions (Signed)
Stop taking ASA  Stop smoking  Use  a good lotion moisturizing lotion twice a day.  I will see you in 6 months.

## 2012-09-11 NOTE — Telephone Encounter (Signed)
New problem   Pt is having problems with bruising areas on arm and hands that is bleeding. Please call pt.

## 2012-09-11 NOTE — Telephone Encounter (Signed)
Appointment scheduled with Dr.Jordan today 09/11/12.

## 2012-09-11 NOTE — Telephone Encounter (Signed)
Pt has been having problems with dry skin and bleeding.  Concerned with his Coumadin.  Will make him an appt to come into the clinic today to evaluate bruising and check INR.

## 2012-09-12 NOTE — Progress Notes (Signed)
History of present illness: Jay Guzman is seen for a followup visit. He has a history of coronary disease with remote anterior myocardial infarction. Cardiac catheterization in 2000 showed an occluded LAD. He also has history of apical mural thrombus and has been on chronic Coumadin therapy. His major complaint today is that he has had some breakdown of skin on his forearms with bleeding. His INR was checked and was therapeutic at 2.7. His skin is very dry. He denies any significant chest pain or shortness of breath. He continues to smoke. He does have some mild urinary frequency.  Allergies  Allergen Reactions  . Codeine   . Morphine And Related     Current Outpatient Prescriptions on File Prior to Visit  Medication Sig Dispense Refill  . GLUCOSAMINE PO Take by mouth 3 (three) times daily.        . Lansoprazole (PREVACID PO) Take by mouth as needed.        . nitroGLYCERIN (NITROSTAT) 0.4 MG SL tablet Place 0.4 mg under the tongue every 5 (five) minutes as needed.        . ramipril (ALTACE) 10 MG tablet Take 1 tablet (10 mg total) by mouth daily.  90 tablet  1  . warfarin (COUMADIN) 5 MG tablet Take as directed from coumadin clinic  135 tablet  3  . zolpidem (AMBIEN) 5 MG tablet Take 5 mg by mouth at bedtime as needed.         No current facility-administered medications on file prior to visit.    Past Medical History  Diagnosis Date  . Coronary disease     WITH REMOTE ANTERIOR MYOCARDIAL INFARCTION  . Mural thrombus of heart     CHRONIC  . Hypertension   . Hyperlipidemia   . CHF (congestive heart failure)     Past Surgical History  Procedure Laterality Date  . Cardiac catheterization  2000  . Hernia repair      STATUS POST BILATERAL  . Abdominal aortic aneurysm repair  2008    Family History  Problem Relation Age of Onset  . Cerebral aneurysm Father   . Cancer Sister   . Cancer Mother     History   Social History  . Marital Status: Married    Spouse Name: N/A   Number of Children: 2  . 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 Smoking status: Current Every Day Smoker -- 1.00 packs/day for 60 years    Types: Cigarettes  . Smokeless tobacco: Not on file  . Alcohol Use: No  . Drug Use: No  . Sexually Active:    Other Topics Concern  . Not on file   Social History Narrative  . No narrative on file    ROS HPI  Allergies  Allergen Reactions  . Codeine   . Morphine And Related     Current Outpatient Prescriptions on File Prior to Visit  Medication Sig Dispense Refill  . GLUCOSAMINE PO Take by mouth 3 (three) times daily.        . Lansoprazole (PREVACID PO) Take by mouth as needed.        . nitroGLYCERIN (NITROSTAT) 0.4 MG SL tablet Place 0.4 mg under the tongue every 5 (five) minutes as needed.        . ramipril (ALTACE) 10 MG tablet Take 1 tablet (10 mg total) by mouth daily.  90 tablet  1  . warfarin (COUMADIN)  5 MG tablet Take as directed from coumadin clinic  135 tablet  3  . zolpidem (AMBIEN) 5 MG tablet Take 5 mg by mouth at bedtime as needed.         No current facility-administered medications on file prior to visit.    Past Medical History  Diagnosis Date  . Coronary disease     WITH REMOTE ANTERIOR MYOCARDIAL INFARCTION  . Mural thrombus of heart     CHRONIC  . Hypertension   . Hyperlipidemia   . CHF (congestive heart failure)     Past Surgical History  Procedure Laterality Date  . Cardiac catheterization  2000  . Hernia repair      STATUS POST BILATERAL  . Abdominal aortic aneurysm repair  2008    Family History  Problem Relation Age of Onset  . Cerebral aneurysm Father   . Cancer Sister   . Cancer Mother     History   Social History  . Marital Status: Married    Spouse Name: N/A    Number of Children: 2  . 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 Smoking status: Current Every  Day Smoker -- 1.00 packs/day for 60 years    Types: Cigarettes  . Smokeless tobacco: Not on file  . Alcohol Use: No  . Drug Use: No  . Sexually Active:    Other Topics Concern  . Not on file   Social History Narrative  . No narrative on file    ROS:  as noted in history of present illness. All other systems were reviewed and are negative.  PHYSICAL EXAM BP 140/80  Pulse 62  Ht 5' 9.5" (1.765 m)  Wt 171 lb 12.8 oz (77.928 kg)  BMI 25.02 kg/m2 He is an elderly, chronically ill-appearing white male in no acute distress. HEENT exam reveals he is normocephalic, atraumatic. His pupils are equal and reactive. Sclera are clear. Oropharynx is clear. Neck is supple without JVD, adenopathy, thyromegaly, or bruits. Lungs are clear. Cardiac exam reveals a regular rate and rhythm without gallop or murmur. Abdomen is soft and nontender without masses or bruits. Femoral and pedal pulses are 2+ and symmetric. He has no edema. Skin is very dry. He has very leathery skin on his forearms. There is some superficial skin breakdown with some crusted blood. Patient is alert and oriented x3. He does have chronic memory loss. Gait is normal.  Laboratory data:  ECG today demonstrates normal sinus rhythm with rate of 61 beats per minute. He has left axis deviation. There is evidence of old inferior and anterior myocardial infarctions. This is unchanged from prior ECGs.  Lab Results  Component Value Date   WBC 8.9 09/11/2012   HGB 17.2* 09/11/2012   HCT 49.7 09/11/2012   PLT 148.0* 09/11/2012   GLUCOSE 90 09/11/2012   CHOL 108 09/11/2012   TRIG 60.0 09/11/2012   HDL 39.80 09/11/2012   LDLCALC 56 09/11/2012   ALT 17 09/11/2012   AST 27 09/11/2012   NA 137 09/11/2012   K 4.4 09/11/2012   CL 100 09/11/2012   CREATININE 1.3 09/11/2012   BUN 15 09/11/2012   CO2 29 09/11/2012   TSH 4.40 09/11/2012   INR 2.7 09/11/2012    ASSESSMENT AND PLAN  1. Coronary disease with remote anterior myocardial infarction and  chronically occluded LAD. He is asymptomatic. Given his tendency to bleed more freely we will stop  his aspirin therapy and continue with Coumadin only.  2. Chronic systolic congestive heart failure. He has moderate LV dysfunction. He is well compensated on his current therapy. Continue metoprolol, Altace, and Lasix.  3. Hyperlipidemia, well controlled on Vytorin.  4. Tobacco abuse. Patient was counseled on smoking cessation. He is not ready to quit.  5. Apical mural thrombus on chronic Coumadin therapy.

## 2012-09-14 ENCOUNTER — Ambulatory Visit: Payer: Medicare Other | Admitting: Cardiology

## 2012-09-22 ENCOUNTER — Telehealth: Payer: Self-pay | Admitting: Cardiology

## 2012-09-22 NOTE — Telephone Encounter (Signed)
New Prob       Pt has some questions regarding office visit with Dr. Swaziland on 5/22. Please call.

## 2012-09-22 NOTE — Telephone Encounter (Signed)
Appt for office visit with Dr. Swaziland  on 5/22 was made on 08/16/12. Pt then saw Dr. Swaziland on 4/28 with 6 month follow up planned.  I told wife I would cancel appt for 5/22.

## 2012-09-26 ENCOUNTER — Ambulatory Visit (INDEPENDENT_AMBULATORY_CARE_PROVIDER_SITE_OTHER): Payer: Medicare Other | Admitting: *Deleted

## 2012-09-26 DIAGNOSIS — Z7901 Long term (current) use of anticoagulants: Secondary | ICD-10-CM

## 2012-09-26 DIAGNOSIS — I219 Acute myocardial infarction, unspecified: Secondary | ICD-10-CM

## 2012-09-26 DIAGNOSIS — I513 Intracardiac thrombosis, not elsewhere classified: Secondary | ICD-10-CM

## 2012-10-05 ENCOUNTER — Ambulatory Visit: Payer: Medicare Other | Admitting: Cardiology

## 2012-10-10 ENCOUNTER — Other Ambulatory Visit: Payer: Self-pay | Admitting: Cardiology

## 2012-10-19 ENCOUNTER — Other Ambulatory Visit: Payer: Self-pay | Admitting: Emergency Medicine

## 2012-10-19 DIAGNOSIS — I2510 Atherosclerotic heart disease of native coronary artery without angina pectoris: Secondary | ICD-10-CM

## 2012-10-19 MED ORDER — METOPROLOL SUCCINATE ER 50 MG PO TB24: 50.0000 mg | ORAL_TABLET | Freq: Every day | ORAL | Status: DC

## 2012-10-19 NOTE — Telephone Encounter (Signed)
Medication was sent to wrong pharmacy

## 2012-10-20 ENCOUNTER — Telehealth: Payer: Self-pay | Admitting: Cardiology

## 2012-10-20 NOTE — Telephone Encounter (Signed)
Spoke with wife and advised Dr Swaziland is out of the office. She states her husband is having a hard time sleeping and would like Dr Swaziland to Rx a sleeping pill, per wife has done in the past. Advised Dr Swaziland not back in the office until next week and to contact PCP. Per wife patient does not have PCP. Will forward to Dr Swaziland for review.

## 2012-10-20 NOTE — Telephone Encounter (Signed)
New Problem:    Patient's wife called in wanting Dr. Swaziland to call her.  Patient will be away form her home until 3:00pm.  Please call back.

## 2012-10-21 ENCOUNTER — Telehealth: Payer: Self-pay | Admitting: Cardiology

## 2012-10-21 NOTE — Telephone Encounter (Signed)
I do not recommend prescription sleeping pills for Jay Guzman given his age and risk of serious side effects.  Yalitza Teed Swaziland MD, [facility]

## 2012-10-23 ENCOUNTER — Telehealth: Payer: Self-pay | Admitting: *Deleted

## 2012-10-23 NOTE — Telephone Encounter (Signed)
Advised wife and she stated she would discuss at a later date with Dr Swaziland.                I do not recommend prescription sleeping pills for Jay Guzman given his age and risk of serious side effects.  Peter Swaziland MD, [facility]         [name] at 10/20/2012 8:59 AM    Status: Signed             Spoke with wife and advised Dr Swaziland is out of the office. She states her husband is having a hard time sleeping and would like Dr Swaziland to Rx a sleeping pill, per wife has done in the past. Advised Dr Swaziland not back in the office until next week and to contact PCP. Per wife patient does not have PCP. Will forward to Dr Swaziland for review.         Paulene Floor at 10/20/2012 8:23 AM    Status: Signed             New Problem:  Patient's wife called in wanting Dr. Swaziland to call her. Patient will be away form her home until 3:00pm. Please call back.

## 2012-10-23 NOTE — Telephone Encounter (Signed)
Opened in error

## 2012-10-25 NOTE — Telephone Encounter (Signed)
Returned call to patient's wife Dr.Jordan does not recommend prescription sleeping pills.

## 2012-10-26 ENCOUNTER — Telehealth: Payer: Self-pay | Admitting: Cardiology

## 2012-10-26 NOTE — Telephone Encounter (Signed)
New problem    pts wife called again but did not disclose what she needed

## 2012-10-27 NOTE — Telephone Encounter (Signed)
Dr.Jordan returned call to patient's wife he advised no nerve medication.

## 2012-11-07 ENCOUNTER — Ambulatory Visit (INDEPENDENT_AMBULATORY_CARE_PROVIDER_SITE_OTHER): Payer: Medicare Other | Admitting: *Deleted

## 2012-11-07 DIAGNOSIS — I219 Acute myocardial infarction, unspecified: Secondary | ICD-10-CM

## 2012-11-07 DIAGNOSIS — I513 Intracardiac thrombosis, not elsewhere classified: Secondary | ICD-10-CM

## 2012-11-07 DIAGNOSIS — Z7901 Long term (current) use of anticoagulants: Secondary | ICD-10-CM

## 2012-12-19 ENCOUNTER — Ambulatory Visit (INDEPENDENT_AMBULATORY_CARE_PROVIDER_SITE_OTHER): Payer: Medicare Other | Admitting: *Deleted

## 2012-12-19 DIAGNOSIS — I219 Acute myocardial infarction, unspecified: Secondary | ICD-10-CM

## 2012-12-19 DIAGNOSIS — I513 Intracardiac thrombosis, not elsewhere classified: Secondary | ICD-10-CM

## 2012-12-19 DIAGNOSIS — Z7901 Long term (current) use of anticoagulants: Secondary | ICD-10-CM

## 2012-12-19 LAB — POCT INR: INR: 2.5

## 2013-01-11 ENCOUNTER — Other Ambulatory Visit: Payer: Self-pay | Admitting: *Deleted

## 2013-01-11 MED ORDER — RAMIPRIL 10 MG PO CAPS: 10.0000 mg | ORAL_CAPSULE | Freq: Every day | ORAL | Status: DC

## 2013-01-30 ENCOUNTER — Ambulatory Visit (INDEPENDENT_AMBULATORY_CARE_PROVIDER_SITE_OTHER): Payer: Medicare Other | Admitting: Pharmacist

## 2013-01-30 DIAGNOSIS — I219 Acute myocardial infarction, unspecified: Secondary | ICD-10-CM

## 2013-01-30 DIAGNOSIS — I513 Intracardiac thrombosis, not elsewhere classified: Secondary | ICD-10-CM

## 2013-01-30 DIAGNOSIS — Z7901 Long term (current) use of anticoagulants: Secondary | ICD-10-CM

## 2013-03-12 ENCOUNTER — Ambulatory Visit (INDEPENDENT_AMBULATORY_CARE_PROVIDER_SITE_OTHER): Payer: Medicare Other | Admitting: *Deleted

## 2013-03-12 ENCOUNTER — Encounter: Payer: Self-pay | Admitting: Cardiology

## 2013-03-12 ENCOUNTER — Ambulatory Visit (INDEPENDENT_AMBULATORY_CARE_PROVIDER_SITE_OTHER): Payer: Medicare Other | Admitting: Cardiology

## 2013-03-12 VITALS — BP 140/70 | HR 60 | Ht 69.5 in | Wt 169.0 lb

## 2013-03-12 DIAGNOSIS — I259 Chronic ischemic heart disease, unspecified: Secondary | ICD-10-CM

## 2013-03-12 DIAGNOSIS — Z720 Tobacco use: Secondary | ICD-10-CM

## 2013-03-12 DIAGNOSIS — I513 Intracardiac thrombosis, not elsewhere classified: Secondary | ICD-10-CM

## 2013-03-12 DIAGNOSIS — I509 Heart failure, unspecified: Secondary | ICD-10-CM

## 2013-03-12 DIAGNOSIS — I219 Acute myocardial infarction, unspecified: Secondary | ICD-10-CM

## 2013-03-12 DIAGNOSIS — E785 Hyperlipidemia, unspecified: Secondary | ICD-10-CM

## 2013-03-12 DIAGNOSIS — F17200 Nicotine dependence, unspecified, uncomplicated: Secondary | ICD-10-CM

## 2013-03-12 DIAGNOSIS — Z7901 Long term (current) use of anticoagulants: Secondary | ICD-10-CM

## 2013-03-12 DIAGNOSIS — I2510 Atherosclerotic heart disease of native coronary artery without angina pectoris: Secondary | ICD-10-CM

## 2013-03-12 NOTE — Patient Instructions (Signed)
Continue your current medication  I recommend you see dermatology [phone]) [facility].  I will see you in 6 months.

## 2013-03-12 NOTE — Addendum Note (Signed)
Addended by: Meda Klinefelter D on: 03/12/2013 10:27 AM   Modules accepted: Orders

## 2013-03-12 NOTE — Progress Notes (Signed)
History of present illness: Jay Guzman is seen for a followup visit. He has a history of coronary disease with remote anterior myocardial infarction. Cardiac catheterization in 2000 showed an occluded LAD. He also has history of apical mural thrombus and has been on chronic Coumadin therapy. He is doing well from a cardiac standpoint. He denies any significant chest pain or shortness of breath. He complains a lot of bruising on his arms and he has multiple skin lesions that tend to bleed easily. He has no increase in edema. He denies any palpitations.  Allergies  Allergen Reactions  . Codeine   . Morphine And Related     Current Outpatient Prescriptions on File Prior to Visit  Medication Sig Dispense Refill  . ezetimibe-simvastatin (VYTORIN) 10-40 MG per tablet Take 1 tablet by mouth at bedtime.  90 tablet  3  . furosemide (LASIX) 40 MG tablet 1/2 to 1 tablet daily as directed  30 tablet  11  . GLUCOSAMINE PO Take by mouth 3 (three) times daily.        . Lansoprazole (PREVACID PO) Take by mouth as needed.        . metoprolol succinate (TOPROL-XL) 50 MG 24 hr tablet Take 1 tablet (50 mg total) by mouth daily.  90 tablet  3  . nitroGLYCERIN (NITROSTAT) 0.4 MG SL tablet Place 0.4 mg under the tongue every 5 (five) minutes as needed.        . ramipril (ALTACE) 10 MG capsule Take 1 capsule (10 mg total) by mouth daily.  90 capsule  2  . warfarin (COUMADIN) 5 MG tablet Take as directed from coumadin clinic  135 tablet  3  . zolpidem (AMBIEN) 5 MG tablet Take 5 mg by mouth at bedtime as needed.         No current facility-administered medications on file prior to visit.    Past Medical History  Diagnosis Date  . Coronary disease     WITH REMOTE ANTERIOR MYOCARDIAL INFARCTION  . Mural thrombus of heart     CHRONIC  . Hypertension   . Hyperlipidemia   . CHF (congestive heart failure)     Past Surgical History  Procedure Laterality Date  . Cardiac catheterization  2000  . Hernia repair     STATUS POST BILATERAL  . Abdominal aortic aneurysm repair  2008    Family History  Problem Relation Age of Onset  . Cerebral aneurysm Father   . Cancer Sister   . Cancer Mother     History   Social History  . Marital Status: Married    Spouse Name: N/A    Number of Children: 2  . 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 Smoking status: Current Every Day Smoker -- 1.00 packs/day for 60 years    Types: Cigarettes  . Smokeless tobacco: Not on file  . Alcohol Use: No  . Drug Use: No  . Sexual Activity:    Other Topics Concern  . Not on file   Social History Narrative  . No narrative on file    ROS HPI  Allergies  Allergen Reactions  . Codeine   . Morphine And Related     Current Outpatient Prescriptions on File Prior to Visit  Medication Sig Dispense Refill  . ezetimibe-simvastatin (VYTORIN) 10-40 MG per tablet Take 1 tablet by mouth at bedtime.  90 tablet  3  . furosemide (LASIX) 40 MG  tablet 1/2 to 1 tablet daily as directed  30 tablet  11  . GLUCOSAMINE PO Take by mouth 3 (three) times daily.        . Lansoprazole (PREVACID PO) Take by mouth as needed.        . metoprolol succinate (TOPROL-XL) 50 MG 24 hr tablet Take 1 tablet (50 mg total) by mouth daily.  90 tablet  3  . nitroGLYCERIN (NITROSTAT) 0.4 MG SL tablet Place 0.4 mg under the tongue every 5 (five) minutes as needed.        . ramipril (ALTACE) 10 MG capsule Take 1 capsule (10 mg total) by mouth daily.  90 capsule  2  . warfarin (COUMADIN) 5 MG tablet Take as directed from coumadin clinic  135 tablet  3  . zolpidem (AMBIEN) 5 MG tablet Take 5 mg by mouth at bedtime as needed.         No current facility-administered medications on file prior to visit.    Past Medical History  Diagnosis Date  . Coronary disease     WITH REMOTE ANTERIOR MYOCARDIAL INFARCTION  . Mural thrombus of heart     CHRONIC  . Hypertension   . Hyperlipidemia   .  CHF (congestive heart failure)     Past Surgical History  Procedure Laterality Date  . Cardiac catheterization  2000  . Hernia repair      STATUS POST BILATERAL  . Abdominal aortic aneurysm repair  2008    Family History  Problem Relation Age of Onset  . Cerebral aneurysm Father   . Cancer Sister   . Cancer Mother     History   Social History  . Marital Status: Married    Spouse Name: N/A    Number of Children: 2  . 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 Smoking status: Current Every Day Smoker -- 1.00 packs/day for 60 years    Types: Cigarettes  . Smokeless tobacco: Not on file  . Alcohol Use: No  . Drug Use: No  . Sexual Activity:    Other Topics Concern  . Not on file   Social History Narrative  . No narrative on file    ROS:  as noted in history of present illness. All other systems were reviewed and are negative.  PHYSICAL EXAM BP 140/70  Pulse 60  Ht 5' 9.5" (1.765 m)  Wt 169 lb (76.658 kg)  BMI 24.61 kg/m2  SpO2 94% He is an elderly, chronically ill-appearing white male in no acute distress. HEENT exam reveals he is normocephalic, atraumatic. His pupils are equal and reactive. Sclera are clear. Oropharynx is clear. Neck is supple without JVD, adenopathy, thyromegaly, or bruits. Lungs are clear. Cardiac exam reveals a regular rate and rhythm without gallop or murmur. Abdomen is soft and nontender without masses or bruits. Femoral and pedal pulses are 2+ and symmetric. He has no edema. Skin is very dry. He has very leathery skin on his forearms. he has a couple of lesions on his right ear that appeared to be at least precancerous.  Patient is alert and oriented x3. He does have chronic memory loss. Gait is normal.  Laboratory data:    Lab Results  Component Value Date   WBC 8.9 09/11/2012   HGB 17.2* 09/11/2012   HCT 49.7 09/11/2012   PLT 148.0* 09/11/2012   GLUCOSE 90 09/11/2012   CHOL 108  09/11/2012  TRIG 60.0 09/11/2012   HDL 39.80 09/11/2012   LDLCALC 56 09/11/2012   ALT 17 09/11/2012   AST 27 09/11/2012   NA 137 09/11/2012   K 4.4 09/11/2012   CL 100 09/11/2012   CREATININE 1.3 09/11/2012   BUN 15 09/11/2012   CO2 29 09/11/2012   TSH 4.40 09/11/2012   INR 2.6 01/30/2013    ASSESSMENT AND PLAN  1. Coronary disease with remote anterior myocardial infarction and chronically occluded LAD. He is asymptomatic.  Continue Coumadin.  2. Chronic systolic congestive heart failure. He has moderate LV dysfunction. He is well compensated on his current therapy. Continue metoprolol, Altace, and Lasix.  3. Hyperlipidemia, well controlled on Vytorin.  4. Tobacco abuse. Patient was counseled on smoking cessation. He is not ready to quit.  5. Apical mural thrombus on chronic Coumadin therapy.   6. Skin lesions. I've recommended followup with dermatology.   I will see him back again in 6 months and we will check fasting lab work at that time.

## 2013-03-15 ENCOUNTER — Encounter: Payer: Self-pay | Admitting: Family Medicine

## 2013-03-15 ENCOUNTER — Ambulatory Visit (INDEPENDENT_AMBULATORY_CARE_PROVIDER_SITE_OTHER): Payer: Medicare Other | Admitting: Family Medicine

## 2013-03-15 VITALS — BP 130/78 | HR 62 | Temp 97.0°F | Resp 18 | Ht 69.0 in | Wt 168.0 lb

## 2013-03-15 DIAGNOSIS — R351 Nocturia: Secondary | ICD-10-CM

## 2013-03-15 LAB — URINALYSIS, ROUTINE W REFLEX MICROSCOPIC
Bilirubin Urine: NEGATIVE
Glucose, UA: NEGATIVE mg/dL
Ketones, ur: NEGATIVE mg/dL
Specific Gravity, Urine: 1.02 (ref 1.005–1.030)
pH: 6 (ref 5.0–8.0)

## 2013-03-15 NOTE — Progress Notes (Signed)
Subjective:    Patient ID: Jay Guzman, male    DOB: 1932-06-05, 79 y.o.   MRN: [id number]  HPI  Patient post to 3 months of increasing nocturia. His waking up 2-3 times each night to urinate. He also complains of sudden urges to urinate.  However he is only able to produce a small amount. He denies any weeks strain. He mainly complains about sudden urges and frequency. He denies any dysuria. His urinalysis today in clinic is negative. His prostate exam today in clinic is normal. Prostate is not enlarged nor is it tender to palpation. He denies any hesitancy or weak flow. Past Medical History  Diagnosis Date  . Coronary disease     WITH REMOTE ANTERIOR MYOCARDIAL INFARCTION  . Mural thrombus of heart     CHRONIC  . Hypertension   . Hyperlipidemia   . CHF (congestive heart failure)    Current Outpatient Prescriptions on File Prior to Visit  Medication Sig Dispense Refill  . ezetimibe-simvastatin (VYTORIN) 10-40 MG per tablet Take 1 tablet by mouth at bedtime.  90 tablet  3  . furosemide (LASIX) 40 MG tablet 1/2 to 1 tablet daily as directed  30 tablet  11  . GLUCOSAMINE PO Take by mouth 3 (three) times daily.        . Lansoprazole (PREVACID PO) Take by mouth as needed.        . metoprolol succinate (TOPROL-XL) 50 MG 24 hr tablet Take 1 tablet (50 mg total) by mouth daily.  90 tablet  3  . nitroGLYCERIN (NITROSTAT) 0.4 MG SL tablet Place 0.4 mg under the tongue every 5 (five) minutes as needed.        . ramipril (ALTACE) 10 MG capsule Take 1 capsule (10 mg total) by mouth daily.  90 capsule  2  . warfarin (COUMADIN) 5 MG tablet Take as directed from coumadin clinic  135 tablet  3  . zolpidem (AMBIEN) 5 MG tablet Take 5 mg by mouth at bedtime as needed.         No current facility-administered medications on file prior to visit.   Allergies  Allergen Reactions  . Codeine   . Morphine And Related    History   Social History  . Marital Status: Married    Spouse Name: N/A   Number of Children: 2  . 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 Smoking status: Current Every Day Smoker -- 1.00 packs/day for 60 years    Types: Cigarettes  . Smokeless tobacco: Not on file  . Alcohol Use: No  . Drug Use: No  . Sexual Activity:    Other Topics Concern  . Not on file   Social History Narrative  . No narrative on file     Review of Systems  All other systems reviewed and are negative.       Objective:   Physical Exam  Vitals reviewed. Cardiovascular: Normal rate and regular rhythm.   Pulmonary/Chest: Effort normal and breath sounds normal.  Abdominal: Soft. Bowel sounds are normal. He exhibits no distension. There is no tenderness. There is no rebound.  Genitourinary: Rectum normal, prostate normal and penis normal. No penile tenderness.          Assessment & Plan:  1. Nocturnal polyuria Prostate exam today in clinic is normal. His prostate is Exley small for his age. His urinalysis is normal. I will send a  urine culture and check a PSA to be thorough. It always comes back normal, I believe the patient likely due overactive bladder and would recommend vesicare. - Urinalysis, Routine w reflex microscopic - PSA, Medicare - Urine culture

## 2013-03-16 LAB — PSA, MEDICARE: PSA: 0.84 ng/mL (ref <=4.00)

## 2013-03-17 LAB — URINE CULTURE: Colony Count: 6000

## 2013-03-21 ENCOUNTER — Telehealth: Payer: Self-pay | Admitting: Family Medicine

## 2013-03-21 NOTE — Telephone Encounter (Signed)
Please call him- wants to speak with you Ph [phone]

## 2013-03-22 NOTE — Telephone Encounter (Signed)
Called patient.  Told him I'd be glad to examine his ears.  He is concerned he may have a skin cancer.

## 2013-04-02 ENCOUNTER — Telehealth: Payer: Self-pay | Admitting: Cardiology

## 2013-04-02 MED ORDER — WARFARIN SODIUM 5 MG PO TABS: ORAL_TABLET | ORAL | Status: DC

## 2013-04-02 NOTE — Telephone Encounter (Signed)
New message      Refill warfarin at [facility]  mail order pharmacy

## 2013-04-30 ENCOUNTER — Ambulatory Visit (INDEPENDENT_AMBULATORY_CARE_PROVIDER_SITE_OTHER): Payer: Medicare Other | Admitting: General Practice

## 2013-04-30 DIAGNOSIS — Z7901 Long term (current) use of anticoagulants: Secondary | ICD-10-CM

## 2013-04-30 DIAGNOSIS — I219 Acute myocardial infarction, unspecified: Secondary | ICD-10-CM

## 2013-04-30 DIAGNOSIS — I513 Intracardiac thrombosis, not elsewhere classified: Secondary | ICD-10-CM

## 2013-06-14 ENCOUNTER — Ambulatory Visit (INDEPENDENT_AMBULATORY_CARE_PROVIDER_SITE_OTHER): Payer: Medicare Other | Admitting: Pharmacist

## 2013-06-14 DIAGNOSIS — Z7901 Long term (current) use of anticoagulants: Secondary | ICD-10-CM

## 2013-06-14 DIAGNOSIS — I513 Intracardiac thrombosis, not elsewhere classified: Secondary | ICD-10-CM

## 2013-06-14 DIAGNOSIS — I219 Acute myocardial infarction, unspecified: Secondary | ICD-10-CM

## 2013-06-14 LAB — POCT INR: INR: 3.6

## 2013-07-17 ENCOUNTER — Ambulatory Visit (INDEPENDENT_AMBULATORY_CARE_PROVIDER_SITE_OTHER): Payer: Medicare Other | Admitting: Pharmacist

## 2013-07-17 DIAGNOSIS — I219 Acute myocardial infarction, unspecified: Secondary | ICD-10-CM

## 2013-07-17 DIAGNOSIS — Z7901 Long term (current) use of anticoagulants: Secondary | ICD-10-CM

## 2013-07-17 DIAGNOSIS — I513 Intracardiac thrombosis, not elsewhere classified: Secondary | ICD-10-CM

## 2013-07-17 LAB — POCT INR: INR: 2.9

## 2013-08-13 ENCOUNTER — Other Ambulatory Visit: Payer: Self-pay | Admitting: Cardiology

## 2013-08-14 ENCOUNTER — Ambulatory Visit (INDEPENDENT_AMBULATORY_CARE_PROVIDER_SITE_OTHER): Payer: Medicare Other | Admitting: *Deleted

## 2013-08-14 DIAGNOSIS — I219 Acute myocardial infarction, unspecified: Secondary | ICD-10-CM

## 2013-08-14 DIAGNOSIS — Z5181 Encounter for therapeutic drug level monitoring: Secondary | ICD-10-CM | POA: Insufficient documentation

## 2013-08-14 DIAGNOSIS — Z7901 Long term (current) use of anticoagulants: Secondary | ICD-10-CM

## 2013-08-14 DIAGNOSIS — I513 Intracardiac thrombosis, not elsewhere classified: Secondary | ICD-10-CM

## 2013-08-14 LAB — POCT INR: INR: 3.4

## 2013-08-28 ENCOUNTER — Telehealth: Payer: Self-pay | Admitting: Cardiology

## 2013-08-28 MED ORDER — FUROSEMIDE 40 MG PO TABS: 40.0000 mg | ORAL_TABLET | Freq: Every day | ORAL | Status: DC

## 2013-08-28 NOTE — Telephone Encounter (Signed)
Returned call to patient's wife she stated husband needs 90 day and 30 day refill for furosemide. Marland KitchenRefill sent to pharmacy.

## 2013-08-28 NOTE — Telephone Encounter (Signed)
New message    Pt has been out of furosemide for several days waiting on his mail order prescription.  If it takes a few more days to come, will it be ok to wait for it? He will have then been off almost 2 weeks.

## 2013-09-04 ENCOUNTER — Ambulatory Visit (INDEPENDENT_AMBULATORY_CARE_PROVIDER_SITE_OTHER): Payer: Medicare Other | Admitting: Pharmacist

## 2013-09-04 ENCOUNTER — Telehealth: Payer: Self-pay

## 2013-09-04 DIAGNOSIS — I513 Intracardiac thrombosis, not elsewhere classified: Secondary | ICD-10-CM

## 2013-09-04 DIAGNOSIS — I219 Acute myocardial infarction, unspecified: Secondary | ICD-10-CM

## 2013-09-04 DIAGNOSIS — Z7901 Long term (current) use of anticoagulants: Secondary | ICD-10-CM

## 2013-09-04 DIAGNOSIS — Z5181 Encounter for therapeutic drug level monitoring: Secondary | ICD-10-CM

## 2013-09-04 LAB — POCT INR: INR: 4.1

## 2013-09-04 MED ORDER — NITROGLYCERIN 0.4 MG SL SUBL: 0.4000 mg | SUBLINGUAL_TABLET | SUBLINGUAL | Status: DC | PRN

## 2013-09-04 NOTE — Telephone Encounter (Signed)
Spoke to [employer] RN.She stated patient had INR appointment this morning.Stated patient was confused on how to take his Lasix.Patient requested a call back.Returned call to patient he stated he wanted me to talk to his son [name] he is helping him with his medications.Spoke to son [name] he stated father was taking Lasix 40 mg 1/2 tablet daily.Stated when he picked up refill directions said 40 mg 1 tablet daily.Advised to continue 40 mg 1/2 tablet daily.Will check with Dr.Jordan tomorrow 09/05/13 on correct dose.Stated he was going through father's medication and he needed a refill for NTG.Refill sent to pharmacy.Also stated father not taking glucosamine.Advised to come with father to his appointment with Dr.Jordan 09/13/13 at 9:45 am.Advised to bring all medications to appointment.

## 2013-09-11 ENCOUNTER — Telehealth: Payer: Self-pay

## 2013-09-11 NOTE — Telephone Encounter (Signed)
Spoke to son [name] on 09/05/13 Dr.Jordan advised to continue Lasix 40 mg 1/2 tablet daily.Advised to keep appointment with Dr.Jordan 09/13/13.

## 2013-09-12 ENCOUNTER — Other Ambulatory Visit: Payer: Self-pay | Admitting: Cardiology

## 2013-09-13 ENCOUNTER — Ambulatory Visit (INDEPENDENT_AMBULATORY_CARE_PROVIDER_SITE_OTHER): Payer: Medicare Other | Admitting: Cardiology

## 2013-09-13 ENCOUNTER — Encounter (INDEPENDENT_AMBULATORY_CARE_PROVIDER_SITE_OTHER): Payer: Self-pay

## 2013-09-13 ENCOUNTER — Encounter: Payer: Self-pay | Admitting: Cardiology

## 2013-09-13 VITALS — BP 118/78 | HR 56 | Ht 69.0 in | Wt 170.4 lb

## 2013-09-13 DIAGNOSIS — I10 Essential (primary) hypertension: Secondary | ICD-10-CM

## 2013-09-13 DIAGNOSIS — I2510 Atherosclerotic heart disease of native coronary artery without angina pectoris: Secondary | ICD-10-CM

## 2013-09-13 DIAGNOSIS — Z720 Tobacco use: Secondary | ICD-10-CM

## 2013-09-13 DIAGNOSIS — E785 Hyperlipidemia, unspecified: Secondary | ICD-10-CM

## 2013-09-13 DIAGNOSIS — F17200 Nicotine dependence, unspecified, uncomplicated: Secondary | ICD-10-CM

## 2013-09-13 DIAGNOSIS — I509 Heart failure, unspecified: Secondary | ICD-10-CM

## 2013-09-13 LAB — HEPATIC FUNCTION PANEL
ALBUMIN: 4.2 g/dL (ref 3.5–5.2)
ALT: 22 U/L (ref 0–53)
AST: 23 U/L (ref 0–37)
Alkaline Phosphatase: 53 U/L (ref 39–117)
Bilirubin, Direct: 0.3 mg/dL (ref 0.0–0.3)
Total Bilirubin: 2.3 mg/dL — ABNORMAL HIGH (ref 0.3–1.2)
Total Protein: 6.7 g/dL (ref 6.0–8.3)

## 2013-09-13 LAB — CBC WITH DIFFERENTIAL/PLATELET
Basophils Absolute: 0 10*3/uL (ref 0.0–0.1)
Basophils Relative: 0.4 % (ref 0.0–3.0)
EOS PCT: 1.9 % (ref 0.0–5.0)
Eosinophils Absolute: 0.2 10*3/uL (ref 0.0–0.7)
HCT: 53.2 % — ABNORMAL HIGH (ref 39.0–52.0)
Hemoglobin: 17.9 g/dL — ABNORMAL HIGH (ref 13.0–17.0)
Lymphocytes Relative: 23 % (ref 12.0–46.0)
Lymphs Abs: 2 10*3/uL (ref 0.7–4.0)
MCHC: 33.7 g/dL (ref 30.0–36.0)
MCV: 98.1 fl (ref 78.0–100.0)
MONO ABS: 0.8 10*3/uL (ref 0.1–1.0)
Monocytes Relative: 8.8 % (ref 3.0–12.0)
NEUTROS PCT: 65.9 % (ref 43.0–77.0)
Neutro Abs: 5.8 10*3/uL (ref 1.4–7.7)
Platelets: 152 10*3/uL (ref 150.0–400.0)
RBC: 5.42 Mil/uL (ref 4.22–5.81)
RDW: 15 % — ABNORMAL HIGH (ref 11.5–14.6)
WBC: 8.7 10*3/uL (ref 4.5–10.5)

## 2013-09-13 LAB — BASIC METABOLIC PANEL
BUN: 29 mg/dL — ABNORMAL HIGH (ref 6–23)
CHLORIDE: 101 meq/L (ref 96–112)
CO2: 32 meq/L (ref 19–32)
Calcium: 8.8 mg/dL (ref 8.4–10.5)
Creatinine, Ser: 1.4 mg/dL (ref 0.4–1.5)
GFR: 51.18 mL/min — ABNORMAL LOW (ref >60.00)
Glucose, Bld: 97 mg/dL (ref 70–99)
POTASSIUM: 4.4 meq/L (ref 3.5–5.1)
Sodium: 139 mEq/L (ref 135–145)

## 2013-09-13 LAB — LIPID PANEL
Cholesterol: 131 mg/dL (ref 0–200)
HDL: 46.4 mg/dL (ref >39.00)
LDL CALC: 71 mg/dL (ref 0–99)
TRIGLYCERIDES: 68 mg/dL (ref 0.0–149.0)
Total CHOL/HDL Ratio: 3
VLDL: 13.6 mg/dL (ref 0.0–40.0)

## 2013-09-13 LAB — TSH: TSH: 4.14 u[IU]/mL (ref 0.35–5.50)

## 2013-09-13 NOTE — Progress Notes (Signed)
History of present illness: Jay Guzman is seen for a followup visit. He has a history of coronary disease with remote anterior myocardial infarction. Cardiac catheterization in 2000 showed an occluded LAD. He also has history of apical mural thrombus and has been on chronic Coumadin therapy. He reports he is doing well from a cardiac standpoint. He denies any significant chest pain. He does have some SOB and continues to smoke. He has chronic bruising on his arms. He has no increase in edema. He denies any palpitations. On his last coumadin clinic visit he was confused about his medication. His son Jay Guzman called and was concerned about his father's memory and whether he was taking medications as prescribed. We requested his son to come with him to appt. But Jay Guzman came alone today without his medications. He states there is nothing wrong with him. 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   Allergies  Allergen Reactions  . Codeine   . Morphine And Related     Current Outpatient Prescriptions on File Prior to Visit  Medication Sig Dispense Refill  . ezetimibe-simvastatin (VYTORIN) 10-40 MG per tablet Take 1 tablet by mouth at bedtime.  90 tablet  3  . furosemide (LASIX) 40 MG tablet Take 1/2 tablet daily  30 tablet  6  . GLUCOSAMINE PO Take by mouth 3 (three) times daily.        . Lansoprazole (PREVACID PO) Take by mouth as needed.        . metoprolol succinate (TOPROL-XL) 50 MG 24 hr tablet Take 1 tablet by mouth  daily  90 tablet  0  . nitroGLYCERIN (NITROSTAT) 0.4 MG SL tablet Place 1 tablet (0.4 mg total) under the tongue every 5 (five) minutes as needed.  25 tablet  6  . ramipril (ALTACE) 10 MG capsule Take 1 capsule (10 mg  total) by mouth daily.  90 capsule  0  . warfarin (COUMADIN) 5 MG tablet Take as directed from coumadin clinic  135 tablet  1  . zolpidem (AMBIEN) 5 MG tablet Take 5 mg by mouth at bedtime as needed.         No current  facility-administered medications on file prior to visit.    Past Medical History  Diagnosis Date  . Coronary disease     WITH REMOTE ANTERIOR MYOCARDIAL INFARCTION  . Mural thrombus of heart     CHRONIC  . Hypertension   . Hyperlipidemia   . CHF (congestive heart failure)     Past Surgical History  Procedure Laterality Date  . Cardiac catheterization  2000  . Hernia repair      STATUS POST BILATERAL  . Abdominal aortic aneurysm repair  2008    Family History  Problem Relation Age of Onset  . Cerebral aneurysm Father   . Cancer Sister   . Cancer Mother     History   Social History  . Marital Status: Married    Spouse Name: N/A    Number of Children: 2  . 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 Smoking status: Current Every Day Smoker -- 1.00 packs/day for 60 years    Types: Cigarettes  . Smokeless tobacco: Not on file  . Alcohol Use: No  . Drug Use: No  . Sexual Activity:    Other Topics Concern  . Not on file  Social History Narrative  . No narrative on file    ROS HPI  Allergies  Allergen Reactions  . Codeine   . Morphine And Related     Current Outpatient Prescriptions on File Prior to Visit  Medication Sig Dispense Refill  . ezetimibe-simvastatin (VYTORIN) 10-40 MG per tablet Take 1 tablet by mouth at bedtime.  90 tablet  3  . furosemide (LASIX) 40 MG tablet Take 1/2 tablet daily  30 tablet  6  . GLUCOSAMINE PO Take by mouth 3 (three) times daily.        . Lansoprazole (PREVACID PO) Take by mouth as needed.        . metoprolol succinate (TOPROL-XL) 50 MG 24 hr tablet Take 1 tablet by mouth  daily  90 tablet  0  . nitroGLYCERIN (NITROSTAT) 0.4 MG SL tablet Place 1 tablet (0.4 mg total) under the tongue every 5 (five) minutes as needed.  25 tablet  6  . ramipril (ALTACE) 10 MG capsule Take 1 capsule (10 mg  total) by mouth daily.  90 capsule  0  . warfarin (COUMADIN) 5 MG tablet Take  as directed from coumadin clinic  135 tablet  1  . zolpidem (AMBIEN) 5 MG tablet Take 5 mg by mouth at bedtime as needed.         No current facility-administered medications on file prior to visit.    Past Medical History  Diagnosis Date  . Coronary disease     WITH REMOTE ANTERIOR MYOCARDIAL INFARCTION  . Mural thrombus of heart     CHRONIC  . Hypertension   . Hyperlipidemia   . CHF (congestive heart failure)     Past Surgical History  Procedure Laterality Date  . Cardiac catheterization  2000  . Hernia repair      STATUS POST BILATERAL  . Abdominal aortic aneurysm repair  2008    Family History  Problem Relation Age of Onset  . Cerebral aneurysm Father   . Cancer Sister   . Cancer Mother     History   Social History  . Marital Status: Married    Spouse Name: N/A    Number of Children: 2  . 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 Smoking status: Current Every Day Smoker -- 1.00 packs/day for 60 years    Types: Cigarettes  . Smokeless tobacco: Not on file  . Alcohol Use: No  . Drug Use: No  . Sexual Activity:    Other Topics Concern  . Not on file   Social History Narrative  . No narrative on file    ROS:  as noted in history of present illness. All other systems were reviewed and are negative.  PHYSICAL EXAM BP 118/78  Pulse 56  Ht 5\' 9"  (1.753 m)  Wt 170 lb 6.4 oz (77.293 kg)  BMI 25.15 kg/m2 He is an elderly, chronically ill-appearing white male in no acute distress. HEENT exam reveals he is normocephalic, atraumatic. His pupils are equal and reactive. Sclera are clear. Oropharynx is clear. Neck is supple without JVD, adenopathy, thyromegaly, or bruits. Lungs reveal scattered expiratory wheezes. Cardiac exam reveals a regular rate and rhythm without gallop or murmur. Abdomen is soft and nontender without masses or bruits. Femoral and pedal pulses are 2+ and symmetric. He has no edema.  Skin is very dry. He has very leathery skin on his forearms. He has bruising on his  forearms. Patient is alert and oriented x3. He does have short term  memory loss. Gait is normal.  Laboratory data:    Lab Results  Component Value Date   WBC 8.9 09/11/2012   HGB 17.2* 09/11/2012   HCT 49.7 09/11/2012   PLT 148.0* 09/11/2012   GLUCOSE 90 09/11/2012   CHOL 108 09/11/2012   TRIG 60.0 09/11/2012   HDL 39.80 09/11/2012   LDLCALC 56 09/11/2012   ALT 17 09/11/2012   AST 27 09/11/2012   NA 137 09/11/2012   K 4.4 09/11/2012   CL 100 09/11/2012   CREATININE 1.3 09/11/2012   BUN 15 09/11/2012   CO2 29 09/11/2012   TSH 4.40 09/11/2012   PSA 0.84 03/15/2013   INR 4.1 09/04/2013   Ecg: NSR, rate 56 bpm, LAD, old inferior and anterior infarct. Compared to April 2014 T waves now inverted in V5-6.  ASSESSMENT AND PLAN  1. Coronary disease with remote anterior myocardial infarction and chronically occluded LAD. He is asymptomatic.  Continue Coumadin. Continue metoprolol. Given declining health I would not recommend further ischemic evaluation unless he becomes symptomatic.  2. Chronic systolic congestive heart failure. He has moderate LV dysfunction. He is well compensated on his current therapy. Continue metoprolol, Altace, and Lasix.  3. Hyperlipidemia, well controlled on Vytorin. Will check complete fasting lab work today.  4. Tobacco abuse. Patient was counseled on smoking cessation. Doubt he'll follow through.  5. Apical mural thrombus on chronic Coumadin therapy.   Follow up in 6 months.

## 2013-09-13 NOTE — Patient Instructions (Addendum)
We will check complete lab work today  Compare your medication list today with your medications at home and let us know if there is any conflict.  I will see you in 6 months.  You need to quit smoking!!

## 2013-09-18 ENCOUNTER — Ambulatory Visit (INDEPENDENT_AMBULATORY_CARE_PROVIDER_SITE_OTHER): Payer: Medicare Other

## 2013-09-18 DIAGNOSIS — Z5181 Encounter for therapeutic drug level monitoring: Secondary | ICD-10-CM

## 2013-09-18 DIAGNOSIS — I219 Acute myocardial infarction, unspecified: Secondary | ICD-10-CM

## 2013-09-18 DIAGNOSIS — Z7901 Long term (current) use of anticoagulants: Secondary | ICD-10-CM

## 2013-09-18 DIAGNOSIS — I513 Intracardiac thrombosis, not elsewhere classified: Secondary | ICD-10-CM

## 2013-09-18 LAB — POCT INR: INR: 2.5

## 2013-10-11 ENCOUNTER — Ambulatory Visit (INDEPENDENT_AMBULATORY_CARE_PROVIDER_SITE_OTHER): Payer: Medicare Other

## 2013-10-11 DIAGNOSIS — I219 Acute myocardial infarction, unspecified: Secondary | ICD-10-CM

## 2013-10-11 DIAGNOSIS — Z5181 Encounter for therapeutic drug level monitoring: Secondary | ICD-10-CM

## 2013-10-11 DIAGNOSIS — I513 Intracardiac thrombosis, not elsewhere classified: Secondary | ICD-10-CM

## 2013-10-11 DIAGNOSIS — Z7901 Long term (current) use of anticoagulants: Secondary | ICD-10-CM

## 2013-10-11 LAB — POCT INR: INR: 2.8

## 2013-11-09 ENCOUNTER — Telehealth: Payer: Self-pay

## 2013-11-09 NOTE — Telephone Encounter (Signed)
Patient wife called to get samples of vytorin placed samples up front

## 2013-11-14 ENCOUNTER — Telehealth: Payer: Self-pay

## 2013-11-14 ENCOUNTER — Other Ambulatory Visit: Payer: Medicare Other

## 2013-11-14 NOTE — Telephone Encounter (Signed)
Patient walked in office to pick up samples left for him he told receptionist he was dizzy.B/P 150/80 pulse 60. Patient stated he has been dizzy for 1 week.No chest pain.No sob.Patient also stated he is past due for INR.Dr.Jordan out of office today.Appointment scheduled with Dr.Jordan at our [place] office 11/19/13 at 4:30 pm.Spoke to [facility] in our coumadin clinic.She stated she will be working in our [employer] 11/19/13 and she will do a INR then.Patient advised to bring his son [name] with him to appointment.Advised to go to ER if needed.

## 2013-11-14 NOTE — Telephone Encounter (Signed)
Spoke to son [name] advised he needs to come with father to his appointment with Dr.Jordan 11/19/13.

## 2013-11-19 ENCOUNTER — Ambulatory Visit (INDEPENDENT_AMBULATORY_CARE_PROVIDER_SITE_OTHER): Payer: Medicare Other | Admitting: Cardiology

## 2013-11-19 ENCOUNTER — Encounter: Payer: Self-pay | Admitting: Cardiology

## 2013-11-19 ENCOUNTER — Ambulatory Visit (INDEPENDENT_AMBULATORY_CARE_PROVIDER_SITE_OTHER): Payer: Medicare Other | Admitting: Pharmacist

## 2013-11-19 VITALS — BP 151/86 | HR 62 | Ht 69.0 in | Wt 168.1 lb

## 2013-11-19 DIAGNOSIS — I2510 Atherosclerotic heart disease of native coronary artery without angina pectoris: Secondary | ICD-10-CM

## 2013-11-19 DIAGNOSIS — I5022 Chronic systolic (congestive) heart failure: Secondary | ICD-10-CM

## 2013-11-19 DIAGNOSIS — I10 Essential (primary) hypertension: Secondary | ICD-10-CM

## 2013-11-19 DIAGNOSIS — F17200 Nicotine dependence, unspecified, uncomplicated: Secondary | ICD-10-CM

## 2013-11-19 DIAGNOSIS — E785 Hyperlipidemia, unspecified: Secondary | ICD-10-CM

## 2013-11-19 DIAGNOSIS — I509 Heart failure, unspecified: Secondary | ICD-10-CM

## 2013-11-19 DIAGNOSIS — I513 Intracardiac thrombosis, not elsewhere classified: Secondary | ICD-10-CM

## 2013-11-19 DIAGNOSIS — I219 Acute myocardial infarction, unspecified: Secondary | ICD-10-CM

## 2013-11-19 DIAGNOSIS — Z720 Tobacco use: Secondary | ICD-10-CM

## 2013-11-19 DIAGNOSIS — Z5181 Encounter for therapeutic drug level monitoring: Secondary | ICD-10-CM

## 2013-11-19 DIAGNOSIS — Z7901 Long term (current) use of anticoagulants: Secondary | ICD-10-CM

## 2013-11-19 LAB — POCT INR: INR: 2.9

## 2013-11-19 NOTE — Progress Notes (Signed)
History of present illness: Jay Guzman is seen for a followup visit. He has a history of coronary disease with remote anterior myocardial infarction. Cardiac catheterization in 2000 showed an occluded LAD. He also has history of apical mural thrombus and has been on chronic Coumadin therapy. He reports he is doing well from a cardiac standpoint. He denies any significant chest pain. He does have some SOB and continues to smoke. He states he has cut down to less than one pack per day. He has no increase in edema. He denies any palpitations. He is seen with his son [name] today. We reconciled his medication. Admits that sometimes he will miss a dose of medication. Often will not take lasix if he has to be going somewhere.   Allergies  Allergen Reactions  . Codeine   . Morphine And Related     Current Outpatient Prescriptions on File Prior to Visit  Medication Sig Dispense Refill  . ezetimibe-simvastatin (VYTORIN) 10-40 MG per tablet Take 1 tablet by mouth at bedtime.  90 tablet  3  . furosemide (LASIX) 40 MG tablet Take 1/2 tablet daily  30 tablet  6  . metoprolol succinate (TOPROL-XL) 50 MG 24 hr tablet Take 1 tablet by mouth  daily  90 tablet  0  . nitroGLYCERIN (NITROSTAT) 0.4 MG SL tablet Place 1 tablet (0.4 mg total) under the tongue every 5 (five) minutes as needed.  25 tablet  6  . ramipril (ALTACE) 10 MG capsule Take 1 capsule (10 mg  total) by mouth daily.  90 capsule  0  . warfarin (COUMADIN) 5 MG tablet Take as directed from coumadin clinic  135 tablet  1  . zolpidem (AMBIEN) 5 MG tablet Take 5 mg by mouth at bedtime as needed.         No current facility-administered medications on file prior to visit.    Past Medical History  Diagnosis Date  . Coronary disease     WITH REMOTE ANTERIOR MYOCARDIAL INFARCTION  . Mural thrombus of heart     CHRONIC  . Hypertension   . Hyperlipidemia   . CHF (congestive heart failure)     Past Surgical History  Procedure Laterality Date  .  Cardiac catheterization  2000  . Hernia repair      STATUS POST BILATERAL  . Abdominal aortic aneurysm repair  2008    Family History  Problem Relation Age of Onset  . Cerebral aneurysm Father   . Cancer Sister   . Cancer Mother     History   Social History  . Marital Status: Married    Spouse Name: N/A    Number of Children: 2  . 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 Smoking status: Current Every Day Smoker -- 1.00 packs/day for 60 years    Types: Cigarettes  . Smokeless tobacco: Not on file  . Alcohol Use: No  . Drug Use: No  . Sexual Activity:    Other Topics Concern  . Not on file   Social History Narrative  . No narrative on file     Allergies  Allergen Reactions  . Codeine   . Morphine And Related     Current Outpatient Prescriptions on File Prior to Visit  Medication Sig Dispense Refill  . ezetimibe-simvastatin (VYTORIN) 10-40 MG per tablet Take 1 tablet by mouth at bedtime.  90 tablet  3  . furosemide (LASIX) 40 MG  tablet Take 1/2 tablet daily  30 tablet  6  . metoprolol succinate (TOPROL-XL) 50 MG 24 hr tablet Take 1 tablet by mouth  daily  90 tablet  0  . nitroGLYCERIN (NITROSTAT) 0.4 MG SL tablet Place 1 tablet (0.4 mg total) under the tongue every 5 (five) minutes as needed.  25 tablet  6  . ramipril (ALTACE) 10 MG capsule Take 1 capsule (10 mg  total) by mouth daily.  90 capsule  0  . warfarin (COUMADIN) 5 MG tablet Take as directed from coumadin clinic  135 tablet  1  . zolpidem (AMBIEN) 5 MG tablet Take 5 mg by mouth at bedtime as needed.         No current facility-administered medications on file prior to visit.    Past Medical History  Diagnosis Date  . Coronary disease     WITH REMOTE ANTERIOR MYOCARDIAL INFARCTION  . Mural thrombus of heart     CHRONIC  . Hypertension   . Hyperlipidemia   . CHF (congestive heart failure)     Past Surgical History  Procedure  Laterality Date  . Cardiac catheterization  2000  . Hernia repair      STATUS POST BILATERAL  . Abdominal aortic aneurysm repair  2008    Family History  Problem Relation Age of Onset  . Cerebral aneurysm Father   . Cancer Sister   . Cancer Mother     History   Social History  . Marital Status: Married    Spouse Name: N/A    Number of Children: 2  . 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 Smoking status: Current Every Day Smoker -- 1.00 packs/day for 60 years    Types: Cigarettes  . Smokeless tobacco: Not on file  . Alcohol Use: No  . Drug Use: No  . Sexual Activity:    Other Topics Concern  . Not on file   Social History Narrative  . No narrative on file    ROS: As noted in history of present illness. All other systems were reviewed and are negative.  PHYSICAL EXAM BP 151/86  Pulse 62  Ht 5\' 9"  (1.753 m)  Wt 168 lb 1.6 oz (76.25 kg)  BMI 24.81 kg/m2 He is an elderly, chronically ill-appearing white male in no acute distress. HEENT exam reveals he is normocephalic, atraumatic. His pupils are equal and reactive. Sclera are clear. Oropharynx is clear. Neck is supple without JVD, adenopathy, thyromegaly, or bruits. Lungs reveal scant expiratory wheezes. Cardiac exam reveals a regular rate and rhythm without gallop or murmur. Abdomen is soft and nontender without masses or bruits. Femoral and pedal pulses are 2+ and symmetric. He has no edema. Skin is very dry. He has very leathery skin on his forearms. He has bruising on his forearms. Patient is alert and oriented x3. He does have short term  memory loss. Gait is normal.  Laboratory data:    Lab Results  Component Value Date   WBC 8.7 09/13/2013   HGB 17.9* 09/13/2013   HCT 53.2* 09/13/2013   PLT 152.0 09/13/2013   GLUCOSE 97 09/13/2013   CHOL 131 09/13/2013   TRIG 68.0 09/13/2013   HDL 46.40 09/13/2013   LDLCALC 71 09/13/2013   ALT 22 09/13/2013   AST 23  09/13/2013   NA 139 09/13/2013   K 4.4 09/13/2013   CL 101 09/13/2013   CREATININE 1.4 09/13/2013  BUN 29* 09/13/2013   CO2 32 09/13/2013   TSH 4.14 09/13/2013   PSA 0.84 03/15/2013   INR 2.9 11/19/2013     ASSESSMENT AND PLAN  1. Coronary disease with remote anterior myocardial infarction and chronically occluded LAD. He is asymptomatic.  Continue Coumadin. Continue metoprolol. Given declining health I would not recommend further ischemic evaluation unless he becomes symptomatic.  2. Chronic systolic congestive heart failure. He has moderate LV dysfunction. He is well compensated on his current therapy. Continue metoprolol, Altace, and Lasix.  3. Hyperlipidemia, well controlled on Vytorin. Lab work looks quite good.  4. Tobacco abuse. Patient was counseled on smoking cessation. Not motivated to quit.  5. Apical mural thrombus on chronic Coumadin therapy. Check INR today.  Follow up in 6 months.

## 2013-11-19 NOTE — Patient Instructions (Signed)
Continue your current therapy  I will see you in 6 months.   

## 2013-12-05 ENCOUNTER — Telehealth: Payer: Self-pay | Admitting: Family Medicine

## 2013-12-05 NOTE — Telephone Encounter (Signed)
PT will call back and let us know when he will be able to schedule [facility] and CPE

## 2013-12-10 ENCOUNTER — Telehealth: Payer: Self-pay | Admitting: Cardiology

## 2013-12-10 ENCOUNTER — Other Ambulatory Visit: Payer: Self-pay | Admitting: *Deleted

## 2013-12-10 MED ORDER — WARFARIN SODIUM 5 MG PO TABS: ORAL_TABLET | ORAL | Status: DC

## 2013-12-10 NOTE — Telephone Encounter (Signed)
Pt did not need a nurse,needed an appt.

## 2013-12-10 NOTE — Telephone Encounter (Signed)
New problem:    Per pt's wife need Warfine called in please. Pt has a little left

## 2013-12-10 NOTE — Telephone Encounter (Signed)
Called spoke with pt's wife, has enough Warfarin left wants rx sent to mail order pharmacy [facility].  Rx sent as requested, pt's wife aware.

## 2013-12-11 ENCOUNTER — Telehealth: Payer: Self-pay | Admitting: Cardiology

## 2013-12-18 NOTE — Telephone Encounter (Signed)
Closed encounter °

## 2013-12-27 ENCOUNTER — Other Ambulatory Visit: Payer: Self-pay | Admitting: Cardiology

## 2013-12-31 ENCOUNTER — Ambulatory Visit (INDEPENDENT_AMBULATORY_CARE_PROVIDER_SITE_OTHER): Payer: Medicare Other | Admitting: Pharmacist

## 2013-12-31 DIAGNOSIS — Z5181 Encounter for therapeutic drug level monitoring: Secondary | ICD-10-CM

## 2013-12-31 DIAGNOSIS — I219 Acute myocardial infarction, unspecified: Secondary | ICD-10-CM

## 2013-12-31 DIAGNOSIS — I513 Intracardiac thrombosis, not elsewhere classified: Secondary | ICD-10-CM

## 2013-12-31 DIAGNOSIS — Z7901 Long term (current) use of anticoagulants: Secondary | ICD-10-CM

## 2013-12-31 LAB — POCT INR: INR: 2.8

## 2014-01-01 ENCOUNTER — Other Ambulatory Visit: Payer: Medicare Other

## 2014-01-01 DIAGNOSIS — Z125 Encounter for screening for malignant neoplasm of prostate: Secondary | ICD-10-CM

## 2014-01-01 DIAGNOSIS — I5022 Chronic systolic (congestive) heart failure: Secondary | ICD-10-CM

## 2014-01-01 DIAGNOSIS — E785 Hyperlipidemia, unspecified: Secondary | ICD-10-CM

## 2014-01-01 DIAGNOSIS — I10 Essential (primary) hypertension: Secondary | ICD-10-CM

## 2014-01-01 LAB — LIPID PANEL
CHOLESTEROL: 124 mg/dL (ref 0–200)
HDL: 39 mg/dL — AB (ref >39)
LDL CALC: 70 mg/dL (ref 0–99)
Total CHOL/HDL Ratio: 3.2 Ratio
Triglycerides: 75 mg/dL (ref <150)
VLDL: 15 mg/dL (ref 0–40)

## 2014-01-01 LAB — COMPREHENSIVE METABOLIC PANEL
ALK PHOS: 48 U/L (ref 39–117)
ALT: 12 U/L (ref 0–53)
AST: 17 U/L (ref 0–37)
Albumin: 3.9 g/dL (ref 3.5–5.2)
BUN: 24 mg/dL — ABNORMAL HIGH (ref 6–23)
CALCIUM: 8.7 mg/dL (ref 8.4–10.5)
CO2: 30 mEq/L (ref 19–32)
Chloride: 105 mEq/L (ref 96–112)
Creat: 1.3 mg/dL (ref 0.50–1.35)
GLUCOSE: 94 mg/dL (ref 70–99)
POTASSIUM: 4.4 meq/L (ref 3.5–5.3)
Sodium: 144 mEq/L (ref 135–145)
Total Bilirubin: 2.2 mg/dL — ABNORMAL HIGH (ref 0.2–1.2)
Total Protein: 6.1 g/dL (ref 6.0–8.3)

## 2014-01-01 LAB — CBC WITH DIFFERENTIAL/PLATELET
BASOS PCT: 1 % (ref 0–1)
Basophils Absolute: 0.1 10*3/uL (ref 0.0–0.1)
Eosinophils Absolute: 0.2 10*3/uL (ref 0.0–0.7)
Eosinophils Relative: 3 % (ref 0–5)
HEMATOCRIT: 51.9 % (ref 39.0–52.0)
Hemoglobin: 17.7 g/dL — ABNORMAL HIGH (ref 13.0–17.0)
Lymphocytes Relative: 28 % (ref 12–46)
Lymphs Abs: 1.9 10*3/uL (ref 0.7–4.0)
MCH: 32.4 pg (ref 26.0–34.0)
MCHC: 34.1 g/dL (ref 30.0–36.0)
MCV: 95.1 fL (ref 78.0–100.0)
Monocytes Absolute: 0.5 10*3/uL (ref 0.1–1.0)
Monocytes Relative: 8 % (ref 3–12)
NEUTROS ABS: 4 10*3/uL (ref 1.7–7.7)
Neutrophils Relative %: 60 % (ref 43–77)
Platelets: 114 10*3/uL — ABNORMAL LOW (ref 150–400)
RBC: 5.46 MIL/uL (ref 4.22–5.81)
RDW: 15.1 % (ref 11.5–15.5)
WBC: 6.7 10*3/uL (ref 4.0–10.5)

## 2014-01-02 LAB — PSA, MEDICARE: PSA: 0.89 ng/mL (ref <=4.00)

## 2014-01-04 ENCOUNTER — Ambulatory Visit (INDEPENDENT_AMBULATORY_CARE_PROVIDER_SITE_OTHER): Payer: Medicare Other | Admitting: Family Medicine

## 2014-01-04 ENCOUNTER — Encounter: Payer: Self-pay | Admitting: Family Medicine

## 2014-01-04 VITALS — BP 146/84 | HR 72 | Temp 97.4°F | Resp 22 | Ht 69.0 in | Wt 168.0 lb

## 2014-01-04 DIAGNOSIS — Z23 Encounter for immunization: Secondary | ICD-10-CM

## 2014-01-04 DIAGNOSIS — Z0000 Encounter for general adult medical examination without abnormal findings: Secondary | ICD-10-CM

## 2014-01-04 DIAGNOSIS — I10 Essential (primary) hypertension: Secondary | ICD-10-CM

## 2014-01-04 MED ORDER — DOXAZOSIN MESYLATE 2 MG PO TABS: 2.0000 mg | ORAL_TABLET | Freq: Every day | ORAL | Status: DC

## 2014-01-04 NOTE — Addendum Note (Signed)
Addended by: Shary Decamp B on: 01/04/2014 08:51 AM   Modules accepted: Orders

## 2014-01-04 NOTE — Progress Notes (Signed)
Subjective:    Patient ID: Jay Guzman, male    DOB: [birth year], 80 y.o.   MRN: [id number]  HPI Subjective:   Patient presents for Medicare Annual/Subsequent preventive examination.   Patient is overdue for prostate exam.  He has never had a colonoscopy. He has never had a pneumovax or prevnar or zostavax.  Unfortunately he continues to smoke.  He is audibly wheezing today on examination. He has decreased breath sounds and wheezing and also lung fields. His most recent labs are listed below:   Lab on 01/01/2014  Component Date Value Ref Range Status  . WBC 01/01/2014 6.7  4.0 - 10.5 K/uL Final  . RBC 01/01/2014 5.46  4.22 - 5.81 MIL/uL Final  . Hemoglobin 01/01/2014 17.7* 13.0 - 17.0 g/dL Final  . HCT 01/01/2014 51.9  39.0 - 52.0 % Final  . MCV 01/01/2014 95.1  78.0 - 100.0 fL Final  . MCH 01/01/2014 32.4  26.0 - 34.0 pg Final  . MCHC 01/01/2014 34.1  30.0 - 36.0 g/dL Final  . RDW 01/01/2014 15.1  11.5 - 15.5 % Final  . Platelets 01/01/2014 114* 150 - 400 K/uL Final  . Neutrophils Relative % 01/01/2014 60  43 - 77 % Final  . Neutro Abs 01/01/2014 4.0  1.7 - 7.7 K/uL Final  . Lymphocytes Relative 01/01/2014 28  12 - 46 % Final  . Lymphs Abs 01/01/2014 1.9  0.7 - 4.0 K/uL Final  . Monocytes Relative 01/01/2014 8  3 - 12 % Final  . Monocytes Absolute 01/01/2014 0.5  0.1 - 1.0 K/uL Final  . Eosinophils Relative 01/01/2014 3  0 - 5 % Final  . Eosinophils Absolute 01/01/2014 0.2  0.0 - 0.7 K/uL Final  . Basophils Relative 01/01/2014 1  0 - 1 % Final  . Basophils Absolute 01/01/2014 0.1  0.0 - 0.1 K/uL Final  . Smear Review 01/01/2014 Criteria for review not met   Final  . Sodium 01/01/2014 144  135 - 145 mEq/L Final  . Potassium 01/01/2014 4.4  3.5 - 5.3 mEq/L Final  . Chloride 01/01/2014 105  96 - 112 mEq/L Final  . CO2 01/01/2014 30  19 - 32 mEq/L Final  . Glucose, Bld 01/01/2014 94  70 - 99 mg/dL Final  . BUN 01/01/2014 24* 6 - 23 mg/dL Final  . Creat 01/01/2014 1.30  0.50 -  1.35 mg/dL Final  . Total Bilirubin 01/01/2014 2.2* 0.2 - 1.2 mg/dL Final  . Alkaline Phosphatase 01/01/2014 48  39 - 117 U/L Final  . AST 01/01/2014 17  0 - 37 U/L Final  . ALT 01/01/2014 12  0 - 53 U/L Final  . Total Protein 01/01/2014 6.1  6.0 - 8.3 g/dL Final  . Albumin 01/01/2014 3.9  3.5 - 5.2 g/dL Final  . Calcium 01/01/2014 8.7  8.4 - 10.5 mg/dL Final  . Cholesterol 01/01/2014 124  0 - 200 mg/dL Final   Comment: ATP III Classification:                                < 200        mg/dL        Desirable                               200 - 239     mg/dL  Borderline High                               >= 240        mg/dL        High                             . Triglycerides 01/01/2014 75  <150 mg/dL Final  . HDL 01/01/2014 39* >39 mg/dL Final  . Total CHOL/HDL Ratio 01/01/2014 3.2   Final  . VLDL 01/01/2014 15  0 - 40 mg/dL Final  . LDL Cholesterol 01/01/2014 70  0 - 99 mg/dL Final   Comment:                            Total Cholesterol/HDL Ratio:CHD Risk                                                 Coronary Heart Disease Risk Table                                                                 Men       Women                                   1/2 Average Risk              3.4        3.3                                       Average Risk              5.0        4.4                                    2X Average Risk              9.6        7.1                                    3X Average Risk             23.4       11.0                          Use the calculated Patient Ratio above and the CHD Risk table                           to determine the patient's CHD Risk.  ATP III Classification (LDL):                                < 100        mg/dL         Optimal                               100 - 129     mg/dL         Near or Above Optimal                               130 - 159     mg/dL         Borderline High                                160 - 189     mg/dL         High                                > 190        mg/dL         Very High                             . PSA 01/01/2014 0.89  <=4.00 ng/mL Final   Comment: Test Methodology: ECLIA PSA (Electrochemiluminescence Immunoassay)                                                     For PSA values from 2.5-4.0, particularly in younger men <60 years                          old, the AUA and NCCN suggest testing for % Free PSA (3515) and                          evaluation of the rate of increase in PSA (PSA velocity).  Anti-coag visit on 12/31/2013  Component Date Value Ref Range Status  . INR 12/31/2013 2.8   Final    Review Past Medical/Family/Social: Past Medical History  Diagnosis Date  . Coronary disease     WITH REMOTE ANTERIOR MYOCARDIAL INFARCTION  . Mural thrombus of heart     CHRONIC  . Hypertension   . Hyperlipidemia   . CHF (congestive heart failure)    Past Surgical History  Procedure Laterality Date  . Cardiac catheterization  2000  . Hernia repair      STATUS POST BILATERAL  . Abdominal aortic aneurysm repair  2008   Current Outpatient Prescriptions on File Prior to Visit  Medication Sig Dispense Refill  . ezetimibe-simvastatin (VYTORIN) 10-40 MG per tablet Take 1 tablet by mouth at bedtime.  90 tablet  3  . furosemide (LASIX) 40 MG tablet Take 1/2 tablet daily  30 tablet  6  . metoprolol succinate (TOPROL-XL) 50 MG 24 hr tablet Take  1 tablet by mouth  daily  90 tablet  2  . nitroGLYCERIN (NITROSTAT) 0.4 MG SL tablet Place 1 tablet (0.4 mg total) under the tongue every 5 (five) minutes as needed.  25 tablet  6  . ramipril (ALTACE) 10 MG capsule Take 1 capsule by mouth  daily  90 capsule  2  . warfarin (COUMADIN) 5 MG tablet Take as directed from coumadin clinic  135 tablet  1  . zolpidem (AMBIEN) 5 MG tablet Take 5 mg by mouth at bedtime as needed.         No current facility-administered medications on file prior to visit.   Allergies    Allergen Reactions  . Codeine   . Morphine And Related    History   Social History  . Marital Status: Married    Spouse Name: N/A    Number of Children: 2  . 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 Smoking status: Current Every Day Smoker -- 1.00 packs/day for 60 years    Types: Cigarettes  . Smokeless tobacco: Not on file  . Alcohol Use: No  . Drug Use: No  . Sexual Activity:    Other Topics Concern  . Not on file   Social History Narrative  . No narrative on file   Family History  Problem Relation Age of Onset  . Cerebral aneurysm Father   . Cancer Sister   . Cancer Mother    Depression Screen  (Note: if answer to either of the following is "Yes", a more complete depression screening is indicated)  Over the past two weeks, have you felt down, depressed or hopeless? No Over the past two weeks, have you felt little interest or pleasure in doing things? No Have you lost interest or pleasure in daily life? No Do you often feel hopeless? No Do you cry easily over simple problems? No   Activities of Daily Living  In your present state of health, do you have any difficulty performing the following activities?:  Driving? No  Managing money? No  Feeding yourself? No  Getting from bed to chair? No  Climbing a flight of stairs? No  Preparing food and eating?: No  Bathing or showering? No  Getting dressed: No  Getting to the toilet? No  Using the toilet:No  Moving around from place to place: No  In the past year have you fallen or had a near fall?:No  Are you sexually active? No  Do you have more than one partner? No   Hearing Difficulties: No  Do you often ask people to speak up or repeat themselves? No  Do you experience ringing or noises in your ears? No Do you have difficulty understanding soft or whispered voices? No  Do you feel that you have a problem with memory? No Do you often misplace items?  No  Do you feel safe at home? Yes  Cognitive Testing  Alert? Yes Normal Appearance?Yes  Oriented to person? Yes Place? Yes  Time? Yes  Recall of three objects? Yes  Can perform simple calculations? Yes  Displays appropriate judgment?Yes  Can read the correct time from a watch face?Yes   ]  Review of Systems  All other systems reviewed and are negative.      Objective:   Physical Exam  Vitals reviewed. Constitutional: He is oriented to person, place, and time. He appears well-developed and well-nourished. No distress.  HENT:  Head: Normocephalic and atraumatic.  Right Ear: External ear normal.  Left Ear: External ear normal.  Nose: Nose normal.  Mouth/Throat: Oropharynx is clear and moist. No oropharyngeal exudate.  Eyes: Conjunctivae and EOM are normal. Pupils are equal, round, and reactive to light. Right eye exhibits no discharge. Left eye exhibits no discharge. No scleral icterus.  Neck: Normal range of motion. Neck supple. No JVD present. No tracheal deviation present. No thyromegaly present.  Cardiovascular: Normal rate, regular rhythm, normal heart sounds and intact distal pulses.  Exam reveals no gallop and no friction rub.   No murmur heard. Pulmonary/Chest: Effort normal. No stridor. No respiratory distress. He has wheezes. He has no rales. He exhibits no tenderness.  Abdominal: Soft. Bowel sounds are normal. He exhibits no distension and no mass. There is no tenderness. There is no rebound and no guarding.  Genitourinary: Rectum normal and prostate normal.  Musculoskeletal: Normal range of motion. He exhibits no edema and no tenderness.  Lymphadenopathy:    He has no cervical adenopathy.  Neurological: He is alert and oriented to person, place, and time. He has normal reflexes. He displays normal reflexes. No cranial nerve deficit. He exhibits normal muscle tone. Coordination normal.  Skin: Skin is warm. No rash noted. He is not diaphoretic. No erythema. No pallor.    Psychiatric: He has a normal mood and affect. His behavior is normal. Judgment and thought content normal.          Assessment & Plan:  Essential hypertension - Plan: doxazosin (CARDURA) 2 MG tablet  Routine general medical examination at a health care facility  I recommended a colonoscopy but the patient declined. Again the patient Pneumovax 23. I would recommend Prevnar 13 next year. His blood pressure is elevated today so I added cardura 2 mg poqday.  Recheck blood pressure in one month. I recommended smoking cessation. I will also try the patient on anoro 1 inh qday.  Recheck 1 month.  I will try to schedule the patient for colonoscopy whenever he requests.  Medicare Attestation  I have personally reviewed:  The patient's medical and social history  Their use of alcohol, tobacco or illicit drugs  Their current medications and supplements  The patient's functional ability including ADLs,fall risks, home safety risks, cognitive, and hearing and visual impairment  Diet and physical activities  Evidence for depression or mood disorders  The patient's weight, height, BMI, and visual acuity have been recorded in the chart. I have made referrals, counseling, and provided education to the patient based on review of the above and I have provided the patient with a written personalized care plan for preventive services.

## 2014-01-30 ENCOUNTER — Telehealth: Payer: Self-pay | Admitting: *Deleted

## 2014-01-30 NOTE — Telephone Encounter (Signed)
Vytorin samples placed at the front desk for pick up.

## 2014-02-11 ENCOUNTER — Ambulatory Visit (INDEPENDENT_AMBULATORY_CARE_PROVIDER_SITE_OTHER): Payer: Medicare Other | Admitting: Pharmacist

## 2014-02-11 DIAGNOSIS — I219 Acute myocardial infarction, unspecified: Secondary | ICD-10-CM

## 2014-02-11 DIAGNOSIS — Z5181 Encounter for therapeutic drug level monitoring: Secondary | ICD-10-CM

## 2014-02-11 DIAGNOSIS — I513 Intracardiac thrombosis, not elsewhere classified: Secondary | ICD-10-CM

## 2014-02-11 DIAGNOSIS — Z7901 Long term (current) use of anticoagulants: Secondary | ICD-10-CM

## 2014-02-11 LAB — POCT INR: INR: 3.2

## 2014-03-11 ENCOUNTER — Ambulatory Visit (INDEPENDENT_AMBULATORY_CARE_PROVIDER_SITE_OTHER): Payer: Medicare Other | Admitting: *Deleted

## 2014-03-11 DIAGNOSIS — I513 Intracardiac thrombosis, not elsewhere classified: Secondary | ICD-10-CM

## 2014-03-11 DIAGNOSIS — I219 Acute myocardial infarction, unspecified: Secondary | ICD-10-CM

## 2014-03-11 DIAGNOSIS — Z5181 Encounter for therapeutic drug level monitoring: Secondary | ICD-10-CM

## 2014-03-11 DIAGNOSIS — I213 ST elevation (STEMI) myocardial infarction of unspecified site: Secondary | ICD-10-CM

## 2014-03-11 DIAGNOSIS — Z7901 Long term (current) use of anticoagulants: Secondary | ICD-10-CM

## 2014-03-11 LAB — POCT INR: INR: 2.3

## 2014-03-12 ENCOUNTER — Ambulatory Visit: Payer: Medicare Other | Admitting: Cardiology

## 2014-04-10 ENCOUNTER — Ambulatory Visit (INDEPENDENT_AMBULATORY_CARE_PROVIDER_SITE_OTHER): Payer: Medicare Other | Admitting: *Deleted

## 2014-04-10 DIAGNOSIS — I219 Acute myocardial infarction, unspecified: Secondary | ICD-10-CM

## 2014-04-10 DIAGNOSIS — Z7901 Long term (current) use of anticoagulants: Secondary | ICD-10-CM

## 2014-04-10 DIAGNOSIS — Z5181 Encounter for therapeutic drug level monitoring: Secondary | ICD-10-CM

## 2014-04-10 DIAGNOSIS — I213 ST elevation (STEMI) myocardial infarction of unspecified site: Secondary | ICD-10-CM

## 2014-04-10 DIAGNOSIS — I513 Intracardiac thrombosis, not elsewhere classified: Secondary | ICD-10-CM

## 2014-04-10 LAB — POCT INR: INR: 2.8

## 2014-04-16 ENCOUNTER — Telehealth: Payer: Self-pay | Admitting: Cardiology

## 2014-04-16 NOTE — Telephone Encounter (Signed)
Jay Guzman is calling to speak top you about his medication . Stated that someone called him back . Please call

## 2014-04-16 NOTE — Telephone Encounter (Signed)
Returned call to patient he stated he needed samples of Vytorin 10/40 mg.Samples left at front desk of [facility] office.Follow up appointment scheduled with Dr.Jordan 05/24/14 at 2:30 pm.

## 2014-04-16 NOTE — Telephone Encounter (Signed)
Mr.Devol is calling about his medication and would like to speak to you about it . Please call    Thanks

## 2014-04-17 ENCOUNTER — Telehealth: Payer: Self-pay | Admitting: Cardiology

## 2014-04-17 DIAGNOSIS — I2510 Atherosclerotic heart disease of native coronary artery without angina pectoris: Secondary | ICD-10-CM

## 2014-04-17 MED ORDER — EZETIMIBE-SIMVASTATIN 10-40 MG PO TABS: 1.0000 | ORAL_TABLET | Freq: Every day | ORAL | Status: DC

## 2014-04-17 NOTE — Telephone Encounter (Signed)
Spoke with pt, aware samples at the front desk for pick up 

## 2014-04-17 NOTE — Telephone Encounter (Signed)
Pt called in wanting some samples of Vytorin. Please calll   Thanks

## 2014-05-24 ENCOUNTER — Ambulatory Visit (INDEPENDENT_AMBULATORY_CARE_PROVIDER_SITE_OTHER): Payer: Medicare Other | Admitting: Cardiology

## 2014-05-24 ENCOUNTER — Ambulatory Visit (INDEPENDENT_AMBULATORY_CARE_PROVIDER_SITE_OTHER): Payer: Medicare Other | Admitting: Pharmacist Clinician (PhC)/ Clinical Pharmacy Specialist

## 2014-05-24 ENCOUNTER — Encounter: Payer: Self-pay | Admitting: Cardiology

## 2014-05-24 ENCOUNTER — Other Ambulatory Visit: Payer: Self-pay

## 2014-05-24 VITALS — BP 122/62 | HR 64 | Ht 67.0 in | Wt 174.1 lb

## 2014-05-24 DIAGNOSIS — Z720 Tobacco use: Secondary | ICD-10-CM

## 2014-05-24 DIAGNOSIS — I10 Essential (primary) hypertension: Secondary | ICD-10-CM

## 2014-05-24 DIAGNOSIS — Z7901 Long term (current) use of anticoagulants: Secondary | ICD-10-CM

## 2014-05-24 DIAGNOSIS — I2510 Atherosclerotic heart disease of native coronary artery without angina pectoris: Secondary | ICD-10-CM

## 2014-05-24 DIAGNOSIS — I213 ST elevation (STEMI) myocardial infarction of unspecified site: Secondary | ICD-10-CM

## 2014-05-24 DIAGNOSIS — I5022 Chronic systolic (congestive) heart failure: Secondary | ICD-10-CM

## 2014-05-24 DIAGNOSIS — I513 Intracardiac thrombosis, not elsewhere classified: Secondary | ICD-10-CM

## 2014-05-24 DIAGNOSIS — E785 Hyperlipidemia, unspecified: Secondary | ICD-10-CM

## 2014-05-24 DIAGNOSIS — Z5181 Encounter for therapeutic drug level monitoring: Secondary | ICD-10-CM

## 2014-05-24 LAB — POCT INR: INR: 2.2

## 2014-05-24 MED ORDER — METOPROLOL SUCCINATE ER 50 MG PO TB24: 50.0000 mg | ORAL_TABLET | Freq: Every day | ORAL | Status: DC

## 2014-05-24 MED ORDER — FUROSEMIDE 40 MG PO TABS: ORAL_TABLET | ORAL | Status: DC

## 2014-05-24 MED ORDER — RAMIPRIL 10 MG PO CAPS: 10.0000 mg | ORAL_CAPSULE | Freq: Every day | ORAL | Status: DC

## 2014-05-24 MED ORDER — EZETIMIBE-SIMVASTATIN 10-40 MG PO TABS: 1.0000 | ORAL_TABLET | Freq: Every day | ORAL | Status: DC

## 2014-05-24 MED ORDER — DOXAZOSIN MESYLATE 2 MG PO TABS: 2.0000 mg | ORAL_TABLET | Freq: Every day | ORAL | Status: DC

## 2014-05-24 NOTE — Progress Notes (Signed)
History of present illness: Jay Guzman is seen for follow up CAD. He has a history of coronary disease with remote anterior myocardial infarction. Cardiac catheterization in 2000 showed an occluded LAD. He also has history of apical mural thrombus and has been on chronic Coumadin therapy. He reports he is doing well from a cardiac standpoint. He denies any significant chest pain. He continues to smoke but states he doesn't inhale. He only gets SOB if he tries to move to fast.  He has no edema. He denies any palpitations.No TIA or CVA symptoms. Overall feels he is doing OK.   Allergies  Allergen Reactions  . Codeine   . Morphine And Related     Current Outpatient Prescriptions on File Prior to Visit  Medication Sig Dispense Refill  . doxazosin (CARDURA) 2 MG tablet Take 1 tablet (2 mg total) by mouth daily. 30 tablet 11  . ezetimibe-simvastatin (VYTORIN) 10-40 MG per tablet Take 1 tablet by mouth at bedtime. 28 tablet 0  . furosemide (LASIX) 40 MG tablet Take 1/2 tablet daily 30 tablet 6  . metoprolol succinate (TOPROL-XL) 50 MG 24 hr tablet Take 1 tablet by mouth  daily 90 tablet 2  . nitroGLYCERIN (NITROSTAT) 0.4 MG SL tablet Place 1 tablet (0.4 mg total) under the tongue every 5 (five) minutes as needed. 25 tablet 6  . ramipril (ALTACE) 10 MG capsule Take 1 capsule by mouth  daily 90 capsule 2  . warfarin (COUMADIN) 5 MG tablet Take as directed from coumadin clinic 135 tablet 1   No current facility-administered medications on file prior to visit.    Past Medical History  Diagnosis Date  . Coronary disease     WITH REMOTE ANTERIOR MYOCARDIAL INFARCTION  . Mural thrombus of heart     CHRONIC  . Hypertension   . Hyperlipidemia   . CHF (congestive heart failure)     Past Surgical History  Procedure Laterality Date  . Cardiac catheterization  2000  . Hernia repair      STATUS POST BILATERAL  . Abdominal aortic aneurysm repair  2008    Family History  Problem Relation Age of Onset   . Cerebral aneurysm Father   . Cancer Sister   . Cancer Mother      History   Social History  . Marital Status: Married    Spouse Name: N/A    Number of Children: 2  . 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 Smoking status: Current Every Day Smoker -- 1.00 packs/day for 60 years    Types: Cigarettes  . Smokeless tobacco: Not on file  . Alcohol Use: No  . Drug Use: No  . Sexual Activity: Not on file   Other Topics Concern  . Not on file   Social History Narrative     Allergies  Allergen Reactions  . Codeine   . Morphine And Related     Current Outpatient Prescriptions on File Prior to Visit  Medication Sig Dispense Refill  . doxazosin (CARDURA) 2 MG tablet Take 1 tablet (2 mg total) by mouth daily. 30 tablet 11  . ezetimibe-simvastatin (VYTORIN) 10-40 MG per tablet Take 1 tablet by mouth at bedtime. 28 tablet 0  . furosemide (LASIX) 40 MG tablet Take 1/2 tablet daily 30 tablet 6  . metoprolol succinate (TOPROL-XL) 50 MG 24 hr tablet Take 1 tablet by mouth  daily 90 tablet 2  . nitroGLYCERIN (  NITROSTAT) 0.4 MG SL tablet Place 1 tablet (0.4 mg total) under the tongue every 5 (five) minutes as needed. 25 tablet 6  . ramipril (ALTACE) 10 MG capsule Take 1 capsule by mouth  daily 90 capsule 2  . warfarin (COUMADIN) 5 MG tablet Take as directed from coumadin clinic 135 tablet 1   No current facility-administered medications on file prior to visit.    Past Medical History  Diagnosis Date  . Coronary disease     WITH REMOTE ANTERIOR MYOCARDIAL INFARCTION  . Mural thrombus of heart     CHRONIC  . Hypertension   . Hyperlipidemia   . CHF (congestive heart failure)     Past Surgical History  Procedure Laterality Date  . Cardiac catheterization  2000  . Hernia repair      STATUS POST BILATERAL  . Abdominal aortic aneurysm repair  2008    Family History  Problem Relation Age of Onset  . Cerebral  aneurysm Father   . Cancer Sister   . Cancer Mother      History   Social History  . Marital Status: Married    Spouse Name: N/A    Number of Children: 2  . 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 Smoking status: Current Every Day Smoker -- 1.00 packs/day for 60 years    Types: Cigarettes  . Smokeless tobacco: Not on file  . Alcohol Use: No  . Drug Use: No  . Sexual Activity: Not on file   Other Topics Concern  . Not on file   Social History Narrative    ROS: As noted in history of present illness. All other systems were reviewed and are negative.  PHYSICAL EXAM BP 122/62 mmHg  Pulse 64  Ht 5\' 7"  (1.702 m)  Wt 174 lb 1.6 oz (78.971 kg)  BMI 27.26 kg/m2 He is an elderly, chronically ill-appearing white male in no acute distress. HEENT exam reveals he is normocephalic, atraumatic. His pupils are equal and reactive. Sclera are clear. Oropharynx is clear. Neck is supple without JVD, adenopathy, thyromegaly, or bruits. Lungs reveal few rhonchi. Cardiac exam reveals a regular rate and rhythm without gallop or murmur. Abdomen is soft and nontender without masses or bruits. Femoral and pedal pulses are 2+ and symmetric. He has no edema. Skin is very dry. He has very leathery skin on his forearms. He has bruising on his forearms. Patient is alert and oriented x3. He does have short term  memory loss. Gait is normal.  Laboratory data:    Lab Results  Component Value Date   WBC 6.7 01/01/2014   HGB 17.7* 01/01/2014   HCT 51.9 01/01/2014   PLT 114* 01/01/2014   GLUCOSE 94 01/01/2014   CHOL 124 01/01/2014   TRIG 75 01/01/2014   HDL 39* 01/01/2014   LDLCALC 70 01/01/2014   ALT 12 01/01/2014   AST 17 01/01/2014   NA 144 01/01/2014   K 4.4 01/01/2014   CL 105 01/01/2014   CREATININE 1.30 01/01/2014   BUN 24* 01/01/2014   CO2 30 01/01/2014   TSH 4.14 09/13/2013   PSA 0.89 01/01/2014   INR 2.8 04/10/2014      ASSESSMENT AND PLAN  1. Coronary disease with remote anterior myocardial infarction and chronically occluded LAD. He is asymptomatic.  Continue Coumadin. Continue metoprolol. Given declining health I would not recommend further ischemic evaluation unless he becomes symptomatic.  2.  Chronic systolic congestive heart failure. He has moderate LV dysfunction. He is well compensated on his current therapy. Continue metoprolol, Altace, and Lasix.  3. Hyperlipidemia, well controlled on Vytorin. Refilled today.  4. Tobacco abuse. Patient was counseled on smoking cessation. Not motivated to quit.  5. Apical mural thrombus on chronic Coumadin therapy. Check INR today.  Follow up in 6 months with fasting labs and CBC.

## 2014-05-24 NOTE — Patient Instructions (Signed)
Continue your current therapy  We will see you in 6 months with fasting lab work

## 2014-06-27 ENCOUNTER — Other Ambulatory Visit: Payer: Self-pay | Admitting: Cardiology

## 2014-07-12 ENCOUNTER — Ambulatory Visit: Payer: Self-pay | Admitting: Pharmacist Clinician (PhC)/ Clinical Pharmacy Specialist

## 2014-07-15 ENCOUNTER — Ambulatory Visit (INDEPENDENT_AMBULATORY_CARE_PROVIDER_SITE_OTHER): Payer: Medicare Other | Admitting: Pharmacist Clinician (PhC)/ Clinical Pharmacy Specialist

## 2014-07-15 DIAGNOSIS — Z5181 Encounter for therapeutic drug level monitoring: Secondary | ICD-10-CM

## 2014-07-15 DIAGNOSIS — Z7901 Long term (current) use of anticoagulants: Secondary | ICD-10-CM

## 2014-07-15 DIAGNOSIS — I513 Intracardiac thrombosis, not elsewhere classified: Secondary | ICD-10-CM

## 2014-07-15 DIAGNOSIS — I213 ST elevation (STEMI) myocardial infarction of unspecified site: Secondary | ICD-10-CM

## 2014-07-15 DIAGNOSIS — I219 Acute myocardial infarction, unspecified: Secondary | ICD-10-CM

## 2014-07-15 LAB — POCT INR: INR: 3.4

## 2014-08-03 ENCOUNTER — Telehealth: Payer: Self-pay | Admitting: Cardiology

## 2014-08-03 NOTE — Telephone Encounter (Signed)
  Patient called after hours complaining of insomnia and requesting for sleep agent. I informed patient that he will need to consult his PCP for sleep agents. He verbalized understanding.   Jay Guzman 08/03/2014

## 2014-08-05 ENCOUNTER — Ambulatory Visit (INDEPENDENT_AMBULATORY_CARE_PROVIDER_SITE_OTHER): Payer: Medicare Other | Admitting: Pharmacist Clinician (PhC)/ Clinical Pharmacy Specialist

## 2014-08-05 ENCOUNTER — Ambulatory Visit: Payer: Medicare Other | Admitting: Pharmacist Clinician (PhC)/ Clinical Pharmacy Specialist

## 2014-08-05 DIAGNOSIS — I213 ST elevation (STEMI) myocardial infarction of unspecified site: Secondary | ICD-10-CM

## 2014-08-05 DIAGNOSIS — Z7901 Long term (current) use of anticoagulants: Secondary | ICD-10-CM

## 2014-08-05 DIAGNOSIS — I513 Intracardiac thrombosis, not elsewhere classified: Secondary | ICD-10-CM

## 2014-08-05 DIAGNOSIS — I219 Acute myocardial infarction, unspecified: Secondary | ICD-10-CM

## 2014-08-05 DIAGNOSIS — Z5181 Encounter for therapeutic drug level monitoring: Secondary | ICD-10-CM

## 2014-08-05 LAB — POCT INR: INR: 3.9

## 2014-08-07 ENCOUNTER — Ambulatory Visit: Payer: Medicare Other | Admitting: Pharmacist Clinician (PhC)/ Clinical Pharmacy Specialist

## 2014-08-19 ENCOUNTER — Ambulatory Visit (INDEPENDENT_AMBULATORY_CARE_PROVIDER_SITE_OTHER): Payer: Medicare Other | Admitting: Pharmacist Clinician (PhC)/ Clinical Pharmacy Specialist

## 2014-08-19 DIAGNOSIS — I513 Intracardiac thrombosis, not elsewhere classified: Secondary | ICD-10-CM

## 2014-08-19 DIAGNOSIS — Z5181 Encounter for therapeutic drug level monitoring: Secondary | ICD-10-CM | POA: Diagnosis not present

## 2014-08-19 DIAGNOSIS — I213 ST elevation (STEMI) myocardial infarction of unspecified site: Secondary | ICD-10-CM | POA: Diagnosis not present

## 2014-08-19 DIAGNOSIS — I219 Acute myocardial infarction, unspecified: Secondary | ICD-10-CM

## 2014-08-19 DIAGNOSIS — Z7901 Long term (current) use of anticoagulants: Secondary | ICD-10-CM

## 2014-08-19 LAB — POCT INR: INR: 3.2

## 2014-08-26 ENCOUNTER — Inpatient Hospital Stay (HOSPITAL_COMMUNITY)
Admission: EM | Admit: 2014-08-26 | Discharge: 2014-08-29 | DRG: 193 | Disposition: A | Discharge status: Home / Self Care | Payer: Medicare Other | Attending: Internal Medicine | Admitting: Internal Medicine

## 2014-08-26 ENCOUNTER — Ambulatory Visit (INDEPENDENT_AMBULATORY_CARE_PROVIDER_SITE_OTHER): Payer: Medicare Other | Admitting: Physician Assistant

## 2014-08-26 ENCOUNTER — Encounter: Payer: Self-pay | Admitting: Physician Assistant

## 2014-08-26 ENCOUNTER — Encounter (HOSPITAL_COMMUNITY): Payer: Self-pay | Admitting: Cardiology

## 2014-08-26 ENCOUNTER — Emergency Department (HOSPITAL_COMMUNITY): Payer: Medicare Other

## 2014-08-26 VITALS — BP 144/80 | HR 72 | Temp 97.6°F | Resp 20 | Wt 188.0 lb

## 2014-08-26 DIAGNOSIS — R062 Wheezing: Secondary | ICD-10-CM | POA: Diagnosis not present

## 2014-08-26 DIAGNOSIS — I2510 Atherosclerotic heart disease of native coronary artery without angina pectoris: Secondary | ICD-10-CM

## 2014-08-26 DIAGNOSIS — J441 Chronic obstructive pulmonary disease with (acute) exacerbation: Secondary | ICD-10-CM | POA: Insufficient documentation

## 2014-08-26 DIAGNOSIS — Z23 Encounter for immunization: Secondary | ICD-10-CM | POA: Diagnosis not present

## 2014-08-26 DIAGNOSIS — I252 Old myocardial infarction: Secondary | ICD-10-CM | POA: Diagnosis not present

## 2014-08-26 DIAGNOSIS — Z720 Tobacco use: Secondary | ICD-10-CM | POA: Diagnosis present

## 2014-08-26 DIAGNOSIS — I10 Essential (primary) hypertension: Secondary | ICD-10-CM

## 2014-08-26 DIAGNOSIS — Z809 Family history of malignant neoplasm, unspecified: Secondary | ICD-10-CM

## 2014-08-26 DIAGNOSIS — F17210 Nicotine dependence, cigarettes, uncomplicated: Secondary | ICD-10-CM | POA: Diagnosis present

## 2014-08-26 DIAGNOSIS — I509 Heart failure, unspecified: Secondary | ICD-10-CM

## 2014-08-26 DIAGNOSIS — J9601 Acute respiratory failure with hypoxia: Secondary | ICD-10-CM | POA: Diagnosis present

## 2014-08-26 DIAGNOSIS — I5022 Chronic systolic (congestive) heart failure: Secondary | ICD-10-CM

## 2014-08-26 DIAGNOSIS — Z7901 Long term (current) use of anticoagulants: Secondary | ICD-10-CM | POA: Diagnosis not present

## 2014-08-26 DIAGNOSIS — J189 Pneumonia, unspecified organism: Principal | ICD-10-CM | POA: Diagnosis present

## 2014-08-26 DIAGNOSIS — I513 Intracardiac thrombosis, not elsewhere classified: Secondary | ICD-10-CM | POA: Diagnosis present

## 2014-08-26 DIAGNOSIS — I213 ST elevation (STEMI) myocardial infarction of unspecified site: Secondary | ICD-10-CM

## 2014-08-26 DIAGNOSIS — E785 Hyperlipidemia, unspecified: Secondary | ICD-10-CM | POA: Diagnosis present

## 2014-08-26 DIAGNOSIS — I110 Hypertensive heart disease with heart failure: Secondary | ICD-10-CM | POA: Diagnosis present

## 2014-08-26 DIAGNOSIS — D7589 Other specified diseases of blood and blood-forming organs: Secondary | ICD-10-CM | POA: Diagnosis present

## 2014-08-26 DIAGNOSIS — J449 Chronic obstructive pulmonary disease, unspecified: Secondary | ICD-10-CM | POA: Insufficient documentation

## 2014-08-26 DIAGNOSIS — I5042 Chronic combined systolic (congestive) and diastolic (congestive) heart failure: Secondary | ICD-10-CM | POA: Insufficient documentation

## 2014-08-26 LAB — BRAIN NATRIURETIC PEPTIDE: B NATRIURETIC PEPTIDE 5: 656 pg/mL — AB (ref 0.0–100.0)

## 2014-08-26 LAB — CBC WITH DIFFERENTIAL/PLATELET
Basophils Absolute: 0 10*3/uL (ref 0.0–0.1)
Basophils Relative: 0 % (ref 0–1)
EOS ABS: 0.1 10*3/uL (ref 0.0–0.7)
Eosinophils Relative: 1 % (ref 0–5)
HCT: 49.6 % (ref 39.0–52.0)
HEMOGLOBIN: 16.3 g/dL (ref 13.0–17.0)
Lymphocytes Relative: 16 % (ref 12–46)
Lymphs Abs: 1.2 10*3/uL (ref 0.7–4.0)
MCH: 33.8 pg (ref 26.0–34.0)
MCHC: 32.9 g/dL (ref 30.0–36.0)
MCV: 102.9 fL — ABNORMAL HIGH (ref 78.0–100.0)
MONO ABS: 0.7 10*3/uL (ref 0.1–1.0)
MONOS PCT: 10 % (ref 3–12)
NEUTROS PCT: 73 % (ref 43–77)
Neutro Abs: 5.6 10*3/uL (ref 1.7–7.7)
Platelets: 108 10*3/uL — ABNORMAL LOW (ref 150–400)
RBC: 4.82 MIL/uL (ref 4.22–5.81)
RDW: 14.4 % (ref 11.5–15.5)
WBC: 7.6 10*3/uL (ref 4.0–10.5)

## 2014-08-26 LAB — BASIC METABOLIC PANEL
Anion gap: 7 (ref 5–15)
BUN: 28 mg/dL — ABNORMAL HIGH (ref 6–23)
CO2: 29 mmol/L (ref 19–32)
CREATININE: 1.21 mg/dL (ref 0.50–1.35)
Calcium: 8.6 mg/dL (ref 8.4–10.5)
Chloride: 106 mmol/L (ref 96–112)
GFR calc non Af Amer: 55 mL/min — ABNORMAL LOW (ref >90)
GFR, EST AFRICAN AMERICAN: 64 mL/min — AB (ref >90)
Glucose, Bld: 111 mg/dL — ABNORMAL HIGH (ref 70–99)
POTASSIUM: 4.8 mmol/L (ref 3.5–5.1)
Sodium: 142 mmol/L (ref 135–145)

## 2014-08-26 LAB — LACTIC ACID, PLASMA
Lactic Acid, Venous: 1 mmol/L (ref 0.5–2.0)
Lactic Acid, Venous: 1.6 mmol/L (ref 0.5–2.0)

## 2014-08-26 LAB — PROTIME-INR
INR: 2.57 — ABNORMAL HIGH (ref 0.00–1.49)
Prothrombin Time: 27.8 seconds — ABNORMAL HIGH (ref 11.6–15.2)

## 2014-08-26 LAB — TROPONIN I: Troponin I: 0.03 ng/mL (ref <0.031)

## 2014-08-26 MED ORDER — IPRATROPIUM BROMIDE 0.02 % IN SOLN: 0.5000 mg | Freq: Four times a day (QID) | RESPIRATORY_TRACT | Status: DC

## 2014-08-26 MED ORDER — DOXAZOSIN MESYLATE 2 MG PO TABS
2.0000 mg | ORAL_TABLET | Freq: Every day | ORAL | Status: DC
  Administered 2014-08-27 – 2014-08-29 (×3): 2 mg via ORAL
  Filled 2014-08-26 (×3): qty 1

## 2014-08-26 MED ORDER — SIMVASTATIN 20 MG PO TABS
40.0000 mg | ORAL_TABLET | Freq: Every day | ORAL | Status: DC
  Administered 2014-08-26 – 2014-08-28 (×3): 40 mg via ORAL
  Filled 2014-08-26 (×3): qty 2

## 2014-08-26 MED ORDER — ACETAMINOPHEN 325 MG PO TABS: 650.0000 mg | ORAL_TABLET | Freq: Four times a day (QID) | ORAL | Status: DC | PRN

## 2014-08-26 MED ORDER — ALBUTEROL (5 MG/ML) CONTINUOUS INHALATION SOLN
10.0000 mg/h | INHALATION_SOLUTION | Freq: Once | RESPIRATORY_TRACT | Status: AC
  Administered 2014-08-26: 10 mg/h via RESPIRATORY_TRACT
  Filled 2014-08-26: qty 20

## 2014-08-26 MED ORDER — EZETIMIBE-SIMVASTATIN 10-40 MG PO TABS: 1.0000 | ORAL_TABLET | Freq: Every day | ORAL | Status: DC

## 2014-08-26 MED ORDER — EZETIMIBE 10 MG PO TABS
10.0000 mg | ORAL_TABLET | Freq: Every day | ORAL | Status: DC
  Administered 2014-08-26 – 2014-08-28 (×3): 10 mg via ORAL
  Filled 2014-08-26 (×3): qty 1

## 2014-08-26 MED ORDER — LEVOFLOXACIN IN D5W 750 MG/150ML IV SOLN
750.0000 mg | Freq: Once | INTRAVENOUS | Status: AC
  Administered 2014-08-26: 750 mg via INTRAVENOUS
  Filled 2014-08-26: qty 150

## 2014-08-26 MED ORDER — METHYLPREDNISOLONE SODIUM SUCC 40 MG IJ SOLR
40.0000 mg | Freq: Four times a day (QID) | INTRAMUSCULAR | Status: DC
  Administered 2014-08-26 – 2014-08-28 (×6): 40 mg via INTRAVENOUS
  Filled 2014-08-26 (×6): qty 1

## 2014-08-26 MED ORDER — IPRATROPIUM-ALBUTEROL 0.5-2.5 (3) MG/3ML IN SOLN
3.0000 mL | Freq: Once | RESPIRATORY_TRACT | Status: AC
  Administered 2014-08-26: 3 mL via RESPIRATORY_TRACT

## 2014-08-26 MED ORDER — METOPROLOL SUCCINATE ER 50 MG PO TB24
50.0000 mg | ORAL_TABLET | Freq: Every day | ORAL | Status: DC
  Administered 2014-08-27 – 2014-08-29 (×3): 50 mg via ORAL
  Filled 2014-08-26 (×3): qty 1

## 2014-08-26 MED ORDER — FUROSEMIDE 10 MG/ML IJ SOLN
20.0000 mg | Freq: Two times a day (BID) | INTRAMUSCULAR | Status: DC
  Administered 2014-08-27 – 2014-08-28 (×3): 20 mg via INTRAVENOUS
  Filled 2014-08-26 (×3): qty 2

## 2014-08-26 MED ORDER — SODIUM CHLORIDE 0.9 % IJ SOLN
3.0000 mL | Freq: Two times a day (BID) | INTRAMUSCULAR | Status: DC
  Administered 2014-08-26 – 2014-08-29 (×6): 3 mL via INTRAVENOUS

## 2014-08-26 MED ORDER — SODIUM CHLORIDE 0.9 % IV SOLN: 250.0000 mL | INTRAVENOUS | Status: DC | PRN

## 2014-08-26 MED ORDER — IPRATROPIUM-ALBUTEROL 0.5-2.5 (3) MG/3ML IN SOLN
3.0000 mL | Freq: Four times a day (QID) | RESPIRATORY_TRACT | Status: DC
  Administered 2014-08-27 (×2): 3 mL via RESPIRATORY_TRACT
  Filled 2014-08-26 (×2): qty 3

## 2014-08-26 MED ORDER — ALBUTEROL SULFATE (2.5 MG/3ML) 0.083% IN NEBU: 2.5000 mg | INHALATION_SOLUTION | RESPIRATORY_TRACT | Status: DC

## 2014-08-26 MED ORDER — SODIUM CHLORIDE 0.9 % IJ SOLN
3.0000 mL | INTRAMUSCULAR | Status: DC | PRN
  Administered 2014-08-27: 3 mL via INTRAVENOUS
  Filled 2014-08-26: qty 3

## 2014-08-26 MED ORDER — FUROSEMIDE 10 MG/ML IJ SOLN
40.0000 mg | Freq: Once | INTRAMUSCULAR | Status: AC
  Administered 2014-08-26: 40 mg via INTRAVENOUS
  Filled 2014-08-26: qty 4

## 2014-08-26 MED ORDER — RAMIPRIL 5 MG PO CAPS
10.0000 mg | ORAL_CAPSULE | Freq: Every day | ORAL | Status: DC
  Administered 2014-08-27 – 2014-08-29 (×3): 10 mg via ORAL
  Filled 2014-08-26 (×3): qty 2

## 2014-08-26 MED ORDER — ONDANSETRON HCL 4 MG/2ML IJ SOLN: 4.0000 mg | Freq: Four times a day (QID) | INTRAMUSCULAR | Status: DC | PRN

## 2014-08-26 MED ORDER — ONDANSETRON HCL 4 MG PO TABS: 4.0000 mg | ORAL_TABLET | Freq: Four times a day (QID) | ORAL | Status: DC | PRN

## 2014-08-26 MED ORDER — ACETAMINOPHEN 650 MG RE SUPP: 650.0000 mg | Freq: Four times a day (QID) | RECTAL | Status: DC | PRN

## 2014-08-26 MED ORDER — METHYLPREDNISOLONE ACETATE 80 MG/ML IJ SUSP
80.0000 mg | Freq: Once | INTRAMUSCULAR | Status: AC
Start: 2014-08-26 — End: 2014-08-26
  Administered 2014-08-26: 80 mg via INTRAMUSCULAR

## 2014-08-26 MED ORDER — ALBUTEROL SULFATE (2.5 MG/3ML) 0.083% IN NEBU: 2.5000 mg | INHALATION_SOLUTION | Freq: Four times a day (QID) | RESPIRATORY_TRACT | Status: DC

## 2014-08-26 MED ORDER — ALBUTEROL SULFATE (2.5 MG/3ML) 0.083% IN NEBU: 2.5000 mg | INHALATION_SOLUTION | RESPIRATORY_TRACT | Status: DC | PRN

## 2014-08-26 MED ORDER — CLONAZEPAM 0.5 MG PO TABS
0.5000 mg | ORAL_TABLET | Freq: Every day | ORAL | Status: DC
  Administered 2014-08-26 – 2014-08-28 (×3): 0.5 mg via ORAL
  Filled 2014-08-26 (×2): qty 1

## 2014-08-26 MED ORDER — METHYLPREDNISOLONE SODIUM SUCC 125 MG IJ SOLR
40.0000 mg | Freq: Once | INTRAMUSCULAR | Status: AC
  Administered 2014-08-26: 40 mg via INTRAVENOUS
  Filled 2014-08-26: qty 2

## 2014-08-26 MED ORDER — IPRATROPIUM BROMIDE 0.02 % IN SOLN
1.0000 mg | Freq: Once | RESPIRATORY_TRACT | Status: AC
  Administered 2014-08-26: 1 mg via RESPIRATORY_TRACT
  Filled 2014-08-26: qty 5

## 2014-08-26 NOTE — ED Notes (Signed)
Report given to Cindy RN on 300 

## 2014-08-26 NOTE — H&P (Signed)
PCP:   Odette Fraction, MD   Chief Complaint:  Shortness of breath  HPI: 81 year old male who   has a past medical history of Coronary disease; Mural thrombus of heart; Hypertension; Hyperlipidemia; and CHF (congestive heart failure). Today came to the hospital with chief comment of persistent cough and shortness of breath for past one month. As per patient the pharmacist heard patient wheezing and told him to go to physician. He was seen in the primary care office was given DuoNeb and IM Solu-Medrol without significant improvement, his O2 sats at the office was 80% so he was sent to ED for further evaluation. Patient says that he is coughing up white colored phlegm, denies any fever or chills. No nausea vomiting or diarrhea. Chest x-ray in the ED showed pneumonia patient started on Levaquin in the ED.  Allergies:   Allergies  Allergen Reactions  . Codeine Other (See Comments)    Reaction during surgical  Procedure-unknown  . Morphine And Related Other (See Comments)    Reaction unknown-during surgical procedure      Past Medical History  Diagnosis Date  . Coronary disease     WITH REMOTE ANTERIOR MYOCARDIAL INFARCTION  . Mural thrombus of heart     CHRONIC  . Hypertension   . Hyperlipidemia   . CHF (congestive heart failure)     Past Surgical History  Procedure Laterality Date  . Cardiac catheterization  2000  . Hernia repair      STATUS POST BILATERAL  . Abdominal aortic aneurysm repair  2008    Prior to Admission medications   Medication Sig Start Date End Date Taking? Authorizing Provider  doxazosin (CARDURA) 2 MG tablet Take 1 tablet (2 mg total) by mouth daily. 05/24/14  Yes Peter M Martinique, MD  ezetimibe-simvastatin (VYTORIN) 10-40 MG per tablet Take 1 tablet by mouth at bedtime. 05/24/14  Yes Peter M Martinique, MD  furosemide (LASIX) 40 MG tablet Take 1/2 tablet daily Patient taking differently: Take 20 mg by mouth daily. Take 1/2 tablet daily 05/24/14  Yes Peter M  Martinique, MD  metoprolol succinate (TOPROL-XL) 50 MG 24 hr tablet Take 1 tablet by mouth  daily 07/01/14  Yes Lendon Colonel, NP  nitroGLYCERIN (NITROSTAT) 0.4 MG SL tablet Place 1 tablet (0.4 mg total) under the tongue every 5 (five) minutes as needed. 09/04/13  Yes Peter M Martinique, MD  ramipril (ALTACE) 10 MG capsule Take 1 capsule by mouth  daily 07/01/14  Yes Lendon Colonel, NP  warfarin (COUMADIN) 5 MG tablet Take as directed from  coumadin clinic Patient taking differently: Takes one and one-half tablet 3 days weekly, then takes onetablet on all other days 07/01/14  Yes Lendon Colonel, NP    Social History:  reports that he has been smoking Cigarettes.  He has a 60 pack-year smoking history. He does not have any smokeless tobacco history on file. He reports that he does not drink alcohol or use illicit drugs.  Family History  Problem Relation Age of Onset  . Cerebral aneurysm Father   . Cancer Sister   . Cancer Mother      All the positives are listed in BOLD  Review of Systems:  HEENT: Headache, blurred vision, runny nose, sore throat Neck: Hypothyroidism, hyperthyroidism,,lymphadenopathy Chest : Shortness of breath, history of COPD, Asthma Heart : Chest pain, history of coronary arterey disease GI:  Nausea, vomiting, diarrhea, constipation, GERD GU: Dysuria, urgency, frequency of urination, hematuria Neuro: Stroke, seizures, syncope Psych:  Depression, anxiety, hallucinations   Physical Exam: Blood pressure 137/61, pulse 81, temperature 97.7 F (36.5 C), temperature source Oral, resp. rate 25, height 5' 9.5" (1.765 m), weight 81.647 kg (180 lb), SpO2 94 %. Constitutional:   Patient is a well-developed and well-nourished male* in no acute distress and cooperative with exam. Head: Normocephalic and atraumatic Mouth: Mucus membranes moist Eyes: PERRL, EOMI, conjunctivae normal Neck: Supple, No Thyromegaly Cardiovascular: RRR, S1 normal, S2 normal Pulmonary/Chest:  bilateral rhonchi Abdominal: Soft. Non-tender, non-distended, bowel sounds are normal, no masses, organomegaly, or guarding present.  Neurological: A&O x3, Strength is normal and symmetric bilaterally, cranial nerve II-XII are grossly intact, no focal motor deficit, sensory intact to light touch bilaterally.  Extremities : No Cyanosis, Clubbing, has trace edema bilaterally  Labs on Admission:  Basic Metabolic Panel:  Recent Labs Lab 08/26/14 1810  NA 142  K 4.8  CL 106  CO2 29  GLUCOSE 111*  BUN 28*  CREATININE 1.21  CALCIUM 8.6   CBC:  Recent Labs Lab 08/26/14 1727  WBC 7.6  NEUTROABS 5.6  HGB 16.3  HCT 49.6  MCV 102.9*  PLT 108*   Cardiac Enzymes:  Recent Labs Lab 08/26/14 1810  TROPONINI 0.03    BNP (last 3 results)  Recent Labs  08/26/14 1810  BNP 656.0*    ProBNP (last 3 results) No results for input(s): PROBNP in the last 8760 hours.  CBG: No results for input(s): GLUCAP in the last 168 hours.  Radiological Exams on Admission: Dg Chest 2 View  08/26/2014   CLINICAL DATA:  Worsening shortness of breath since last week. Patient's given Solu-Medrol 80 mg mg.  EXAM: CHEST  2 VIEW  COMPARISON:  06/23/2006  FINDINGS: The heart is enlarged. Hyperinflation. There are coarse interstitial markings and perihilar peribronchial thickening. More focal opacity is identified in the medial right lung base consistent with infectious infiltrate. Possible right upper lobe infiltrate also identified. No pulmonary edema.  IMPRESSION: 1. Hyperinflation and bronchitic changes. 2. Infiltrates in the right medial lung base and right upper lobe are likely infectious.   Electronically Signed   By: Nolon Nations M.D.   On: 08/26/2014 17:55    EKG: Independently reviewed. Sinus rhythm, T-wave abnormality in the lateral leads   Assessment/Plan Active Problems:   Coronary disease   Mural thrombus of heart   Hypertension   CHF (congestive heart failure)   Tobacco abuse    Long term current use of anticoagulant therapy   Pneumonia  Community acquired pneumonia Will obtain Legionella antigen, strep pneumo antigen,blood cultures. Continue IV Levaquin  COPD exacerbation Patient also presented with wheezing bilaterally has a history of COPD, will start DuoNeb nebulizers every 6 hours along with Solu-Medrol 40 mg IV every 6 hours.  History of mural thrombus of heart Patient is on long-term anticoagulant therapy due to history of mural thrombus of heart.Will consult pharmacy for warfarin dosing.  CHF Patient has a history of CAD, BNP elevated to 656.Will obtain 2-D echo cardiac exam in a.m. Lasix 1 dose 40 mg have been given in the ED. Will start Lasix 20 g IV every 12 hours from tomorrow morning  Hypertension Continue metoprolol, ramipril  Code status:Full code  Family discussion: Admission, patients condition and plan of care including tests being ordered have been discussed with the patient and *his son at bedside* who indicate understanding and agree with the plan and Code Status.   Time Spent on Admission: 65 minutes  [facility] Pager: [phone]  08/26/2014, 9:29 PM  If 7PM-7AM, please contact night-coverage  [email or web address]  Password [email or web address]

## 2014-08-26 NOTE — ED Notes (Addendum)
C/o sob worsening last week.  Went to PCP today and given solumedrol 80mg  IM.  Duoneb given in office.

## 2014-08-26 NOTE — ED Provider Notes (Signed)
CSN: [id number]     Arrival date & time 08/26/14  1617 History   First MD Initiated Contact with Patient 08/26/14 1648     Chief Complaint  Patient presents with  . Shortness of Breath      HPI Pt was seen at 1655.  Per pt, c/o gradual onset and worsening of persistent cough, wheezing and SOB for the past 1+ month.  Pt's son states pt's pharmacist "heard him wheezing" and "told him to go to his doctor. " Pt was evaluated at his PMD office today, received duoneb and IM solumedrol 80mg  without improvement in his symptoms. Pt's O2 Sats remained low 80's%, so pt was sent to the ED for further evaluation. Pt continues to smoke. Denies CP/palpitations, no back pain, no abd pain, no N/V/D, no fevers, no rash.     Past Medical History  Diagnosis Date  . Coronary disease     WITH REMOTE ANTERIOR MYOCARDIAL INFARCTION  . Mural thrombus of heart     CHRONIC  . Hypertension   . Hyperlipidemia   . CHF (congestive heart failure)    Past Surgical History  Procedure Laterality Date  . Cardiac catheterization  2000  . Hernia repair      STATUS POST BILATERAL  . Abdominal aortic aneurysm repair  2008   Family History  Problem Relation Age of Onset  . Cerebral aneurysm Father   . Cancer Sister   . Cancer Mother    History  Substance Use Topics  . Smoking status: Current Every Day Smoker -- 1.00 packs/day for 60 years    Types: Cigarettes  . Smokeless tobacco: Not on file  . Alcohol Use: No    Review of Systems ROS: Statement: All systems negative except as marked or noted in the HPI; Constitutional: Negative for fever and chills. ; ; Eyes: Negative for eye pain, redness and discharge. ; ; ENMT: Negative for ear pain, hoarseness, nasal congestion, sinus pressure and sore throat. ; ; Cardiovascular: Negative for chest pain, palpitations, diaphoresis, and peripheral edema. ; ; Respiratory: +cough, wheezing, SOB. Negative for stridor. ; ; Gastrointestinal: Negative for nausea, vomiting,  diarrhea, abdominal pain, blood in stool, hematemesis, jaundice and rectal bleeding. . ; ; Genitourinary: Negative for dysuria, flank pain and hematuria. ; ; Musculoskeletal: Negative for back pain and neck pain. Negative for swelling and trauma.; ; Skin: Negative for pruritus, rash, abrasions, blisters, bruising and skin lesion.; ; Neuro: Negative for headache, lightheadedness and neck stiffness. Negative for weakness, altered level of consciousness , altered mental status, extremity weakness, paresthesias, involuntary movement, seizure and syncope.     Allergies  Codeine and Morphine and related  Home Medications   Prior to Admission medications   Medication Sig Start Date End Date Taking? Authorizing Provider  doxazosin (CARDURA) 2 MG tablet Take 1 tablet (2 mg total) by mouth daily. 05/24/14   Peter M Martinique, MD  ezetimibe-simvastatin (VYTORIN) 10-40 MG per tablet Take 1 tablet by mouth at bedtime. 05/24/14   Peter M Martinique, MD  furosemide (LASIX) 40 MG tablet Take 1/2 tablet daily 05/24/14   Peter M Martinique, MD  metoprolol succinate (TOPROL-XL) 50 MG 24 hr tablet Take 1 tablet by mouth  daily 07/01/14   Lendon Colonel, NP  nitroGLYCERIN (NITROSTAT) 0.4 MG SL tablet Place 1 tablet (0.4 mg total) under the tongue every 5 (five) minutes as needed. 09/04/13   Peter M Martinique, MD  ramipril (ALTACE) 10 MG capsule Take 1 capsule by mouth  daily  07/01/14   Lendon Colonel, NP  warfarin (COUMADIN) 5 MG tablet Take as directed from  coumadin clinic 07/01/14   Lendon Colonel, NP   BP 137/61 mmHg  Pulse 67  Temp(Src) 97.7 F (36.5 C) (Oral)  Resp 24  Ht 5' 9.5" (1.765 m)  Wt 180 lb (81.647 kg)  BMI 26.21 kg/m2  SpO2 91%   Physical Exam  1700: Physical examination:  Nursing notes reviewed; Vital signs and O2 SAT reviewed;  Constitutional: Well developed, Well nourished, In no acute distress; Head:  Normocephalic, atraumatic; Eyes: EOMI, PERRL, No scleral icterus; ENMT: Mouth and pharynx normal,  Mucous membranes dry;; Neck: Supple, Full range of motion, No lymphadenopathy; Cardiovascular: Regular rate and rhythm, No gallop; Respiratory: Breath sounds diminished & equal bilaterally, insp/exp wheezes bilat, no audible wheezing. Speaking full sentences, Normal respiratory effort/excursion; Chest: Nontender, Movement normal; Abdomen: Soft, Nontender, Nondistended, Normal bowel sounds; Genitourinary: No CVA tenderness; Extremities: Pulses normal, No tenderness, No edema, No calf edema or asymmetry.; Neuro: AA&Ox3, vague historian. Major CN grossly intact.  Speech clear. No gross focal motor or sensory deficits in extremities.; Skin: Color normal, Warm, Dry.   ED Course  Procedures     EKG Interpretation None      MDM  MDM Reviewed: previous chart, nursing note and vitals Reviewed previous: labs and ECG Interpretation: labs, ECG and x-ray Total time providing critical care: 30-74 minutes. This excludes time spent performing separately reportable procedures and services. Consults: admitting MD   CRITICAL CARE Performed by: Alfonzo Feller Total critical care time: 35 Critical care time was exclusive of separately billable procedures and treating other patients. Critical care was necessary to treat or prevent imminent or life-threatening deterioration. Critical care was time spent personally by me on the following activities: development of treatment plan with patient and/or surrogate as well as nursing, discussions with consultants, evaluation of patient's response to treatment, examination of patient, obtaining history from patient or surrogate, ordering and performing treatments and interventions, ordering and review of laboratory studies, ordering and review of radiographic studies, pulse oximetry and re-evaluation of patient's condition.   Results for orders placed or performed during the hospital encounter of 08/26/14  CBC with Differential/Platelet  Result Value Ref Range    WBC 7.6 4.0 - 10.5 K/uL   RBC 4.82 4.22 - 5.81 MIL/uL   Hemoglobin 16.3 13.0 - 17.0 g/dL   HCT 49.6 39.0 - 52.0 %   MCV 102.9 (H) 78.0 - 100.0 fL   MCH 33.8 26.0 - 34.0 pg   MCHC 32.9 30.0 - 36.0 g/dL   RDW 14.4 11.5 - 15.5 %   Platelets 108 (L) 150 - 400 K/uL   Neutrophils Relative % 73 43 - 77 %   Neutro Abs 5.6 1.7 - 7.7 K/uL   Lymphocytes Relative 16 12 - 46 %   Lymphs Abs 1.2 0.7 - 4.0 K/uL   Monocytes Relative 10 3 - 12 %   Monocytes Absolute 0.7 0.1 - 1.0 K/uL   Eosinophils Relative 1 0 - 5 %   Eosinophils Absolute 0.1 0.0 - 0.7 K/uL   Basophils Relative 0 0 - 1 %   Basophils Absolute 0.0 0.0 - 0.1 K/uL  Basic metabolic panel  Result Value Ref Range   Sodium 142 135 - 145 mmol/L   Potassium 4.8 3.5 - 5.1 mmol/L   Chloride 106 96 - 112 mmol/L   CO2 29 19 - 32 mmol/L   Glucose, Bld 111 (H) 70 - 99 mg/dL  BUN 28 (H) 6 - 23 mg/dL   Creatinine, Ser 1.21 0.50 - 1.35 mg/dL   Calcium 8.6 8.4 - 10.5 mg/dL   GFR calc non Af Amer 55 (L) >90 mL/min   GFR calc Af Amer 64 (L) >90 mL/min   Anion gap 7 5 - 15  Troponin I  Result Value Ref Range   Troponin I 0.03 <0.031 ng/mL  Brain natriuretic peptide  Result Value Ref Range   B Natriuretic Peptide 656.0 (H) 0.0 - 100.0 pg/mL  Protime-INR  Result Value Ref Range   Prothrombin Time 27.8 (H) 11.6 - 15.2 seconds   INR 2.57 (H) 0.00 - 1.49  Lactic acid, plasma  Result Value Ref Range   Lactic Acid, Venous 1.0 0.5 - 2.0 mmol/L   Dg Chest 2 View 08/26/2014   CLINICAL DATA:  Worsening shortness of breath since last week. Patient's given Solu-Medrol 80 mg mg.  EXAM: CHEST  2 VIEW  COMPARISON:  06/23/2006  FINDINGS: The heart is enlarged. Hyperinflation. There are coarse interstitial markings and perihilar peribronchial thickening. More focal opacity is identified in the medial right lung base consistent with infectious infiltrate. Possible right upper lobe infiltrate also identified. No pulmonary edema.  IMPRESSION: 1. Hyperinflation  and bronchitic changes. 2. Infiltrates in the right medial lung base and right upper lobe are likely infectious.   Electronically Signed   By: Nolon Nations M.D.   On: 08/26/2014 17:55    2030:  On arrival, pt with wheezing and hypoxic: IV solumedrol and hour long neb started. CXR as above; will dose IV CAP abx. BNP elevated, no old to compare; will dose IV lasix. Dx and testing d/w pt and family.  Questions answered.  Verb understanding, agreeable to admit.  T/C to [facility] Dr. Darrick Meigs, case discussed, including:  HPI, pertinent PM/SHx, VS/PE, dx testing, ED course and treatment:  Agreeable to admit, requests to write temporary orders, obtain medical bed to team APAdmits.    Francine Graven, DO 08/29/14 1654

## 2014-08-26 NOTE — Progress Notes (Signed)
Patient ID: TORRION WITTER MRN: [id number], DOB: [birth year], 81 y.o. Date of Encounter: @DATE @  Chief Complaint:  Chief Complaint  Patient presents with  . sick ? pneumonia    feeling bad for over a month    HPI: 81 y.o. year old white male  presents with his son for OV.   They report that he has been having shortness of breath and "rattling in his chest ".  Patient states that he does cough up phlegm at times but says that it's fairly clear according to his report. Says that his nose has been "stopped up". Is that he does get mucus from his nose at times and this is thick mucus. Has had no known fever. States that he is still able to sleep on one pillow with no orthopnea. No PND. No lower extremity edema.    Past Medical History  Diagnosis Date  . Coronary disease     WITH REMOTE ANTERIOR MYOCARDIAL INFARCTION  . Mural thrombus of heart     CHRONIC  . Hypertension   . Hyperlipidemia   . CHF (congestive heart failure)      Home Meds: Outpatient Prescriptions Prior to Visit  Medication Sig Dispense Refill  . doxazosin (CARDURA) 2 MG tablet Take 1 tablet (2 mg total) by mouth daily. 30 tablet 6  . ezetimibe-simvastatin (VYTORIN) 10-40 MG per tablet Take 1 tablet by mouth at bedtime. 30 tablet 6  . furosemide (LASIX) 40 MG tablet Take 1/2 tablet daily 30 tablet 6  . metoprolol succinate (TOPROL-XL) 50 MG 24 hr tablet Take 1 tablet by mouth  daily 90 tablet 4  . nitroGLYCERIN (NITROSTAT) 0.4 MG SL tablet Place 1 tablet (0.4 mg total) under the tongue every 5 (five) minutes as needed. 25 tablet 6  . ramipril (ALTACE) 10 MG capsule Take 1 capsule by mouth  daily 90 capsule 4  . warfarin (COUMADIN) 5 MG tablet Take as directed from  coumadin clinic 135 tablet 4   No facility-administered medications prior to visit.    Allergies:  Allergies  Allergen Reactions  . Codeine   . Morphine And Related     History   Social History  . Marital Status: Married    Spouse  Name: N/A  . Number of Children: 2  . 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 Smoking status: Current Every Day Smoker -- 1.00 packs/day for 60 years    Types: Cigarettes  . Smokeless tobacco: Not on file  . Alcohol Use: No  . Drug Use: No  . Sexual Activity: Not on file   Other Topics Concern  . Not on file   Social History Narrative    Family History  Problem Relation Age of Onset  . Cerebral aneurysm Father   . Cancer Sister   . Cancer Mother      Review of Systems:  See HPI for pertinent ROS. All other ROS negative.    Physical Exam: Blood pressure 144/80, pulse 72, temperature 97.6 F (36.4 C), temperature source Oral, resp. rate 20, weight 188 lb (85.276 kg), SpO2 91 %., Body mass index is 29.44 kg/(m^2). General: WNWD WM. Does not sound dyspneic with talking. Does not demonstrate increased work of breathing or use of accessory muscles. Appears in no acute distress. Head: Normocephalic, atraumatic, eyes without discharge, sclera non-icteric, nares are without discharge. Bilateral auditory canals clear, TM's are without perforation, pearly grey  and translucent with reflective cone of light bilaterally. Oral cavity moist, posterior pharynx without exudate, erythema, peritonsillar abscess.  Neck: Supple. No thyromegaly. No lymphadenopathy. Lungs: Very decreased/distant BS throughout. Can hear slight wheeze throughout bilaterally but sounds very "tight" throughout.  WNWD WM. Does not sound dyspneic with talking. Does not demonstrate increased work of breathing or use of accessory muscles. Appears in no acute distress. SaO2 by me is ranging 81%-83% on [facility] at [place].  Repeat Exam after DuoNeb and DepoMedrol 80mg IM: There is some increase in air movement and breath sounds. But still tight and decreased BS/AirMovement and wheezes.  SaO2 still only 84%  Heart: RRR with S1 S2. No murmurs, rubs, or  gallops. Musculoskeletal:  Strength and tone normal for age. Extremities/Skin: Warm and dry. Neuro: Alert and oriented X 3. Moves all extremities spontaneously. Gait is normal. CNII-XII grossly in tact. Psych:  Responds to questions appropriately with a normal affect.     ASSESSMENT AND PLAN:  81 y.o. year old male with  1. COPD exacerbation  Will give DepoMedrol 80mg  IM x1 now Will give DuoNeb now. Will give these then reassess.   Repeat Exam after this treatment--documented above.  Given exam findings and SaO2 still only 84%, will have him go to ER for further treatment.  Discussed this with patient and son and they are both agreeable to go immediately to the emergency room. Son agrees to drive patient directly to emergency room immediately.    2. Chronic obstructive pulmonary disease, unspecified COPD, unspecified chronic bronchitis type  3. Tobacco abuse Says that he smokes about 1 pack per day. Says that he smoked more than this remotely. He is on NO pulmonary meds.   4. Chronic systolic congestive heart failure Epic states he had EF 42% in 2008. He does not appear to be having any acute CHF signs or symptoms at present.   Signed, [address] [place], [place], [facility] 08/26/2014 3:20 PM

## 2014-08-27 DIAGNOSIS — I509 Heart failure, unspecified: Secondary | ICD-10-CM

## 2014-08-27 DIAGNOSIS — D7589 Other specified diseases of blood and blood-forming organs: Secondary | ICD-10-CM

## 2014-08-27 LAB — CBC
HCT: 47.4 % (ref 39.0–52.0)
Hemoglobin: 15.1 g/dL (ref 13.0–17.0)
MCH: 33 pg (ref 26.0–34.0)
MCHC: 31.9 g/dL (ref 30.0–36.0)
MCV: 103.5 fL — AB (ref 78.0–100.0)
Platelets: 99 10*3/uL — ABNORMAL LOW (ref 150–400)
RBC: 4.58 MIL/uL (ref 4.22–5.81)
RDW: 14.5 % (ref 11.5–15.5)
WBC: 7 10*3/uL (ref 4.0–10.5)

## 2014-08-27 LAB — COMPREHENSIVE METABOLIC PANEL
ALT: 17 U/L (ref 0–53)
AST: 21 U/L (ref 0–37)
Albumin: 3.3 g/dL — ABNORMAL LOW (ref 3.5–5.2)
Alkaline Phosphatase: 48 U/L (ref 39–117)
Anion gap: 9 (ref 5–15)
BILIRUBIN TOTAL: 1.6 mg/dL — AB (ref 0.3–1.2)
BUN: 26 mg/dL — AB (ref 6–23)
CHLORIDE: 104 mmol/L (ref 96–112)
CO2: 30 mmol/L (ref 19–32)
CREATININE: 1.26 mg/dL (ref 0.50–1.35)
Calcium: 8.3 mg/dL — ABNORMAL LOW (ref 8.4–10.5)
GFR calc Af Amer: 61 mL/min — ABNORMAL LOW (ref >90)
GFR calc non Af Amer: 52 mL/min — ABNORMAL LOW (ref >90)
Glucose, Bld: 159 mg/dL — ABNORMAL HIGH (ref 70–99)
Potassium: 4.4 mmol/L (ref 3.5–5.1)
Sodium: 143 mmol/L (ref 135–145)
TOTAL PROTEIN: 5.6 g/dL — AB (ref 6.0–8.3)

## 2014-08-27 LAB — STREP PNEUMONIAE URINARY ANTIGEN: Strep Pneumo Urinary Antigen: NEGATIVE

## 2014-08-27 MED ORDER — WARFARIN SODIUM 5 MG PO TABS
7.5000 mg | ORAL_TABLET | Freq: Once | ORAL | Status: AC
  Administered 2014-08-27: 7.5 mg via ORAL
  Filled 2014-08-27: qty 2

## 2014-08-27 MED ORDER — LEVOFLOXACIN IN D5W 750 MG/150ML IV SOLN
750.0000 mg | INTRAVENOUS | Status: DC
  Administered 2014-08-28: 750 mg via INTRAVENOUS
  Filled 2014-08-27: qty 150

## 2014-08-27 MED ORDER — WARFARIN - PHARMACIST DOSING INPATIENT
Status: DC
  Administered 2014-08-28: 16:00:00

## 2014-08-27 MED ORDER — IPRATROPIUM-ALBUTEROL 0.5-2.5 (3) MG/3ML IN SOLN
3.0000 mL | Freq: Three times a day (TID) | RESPIRATORY_TRACT | Status: DC
  Administered 2014-08-27 – 2014-08-29 (×6): 3 mL via RESPIRATORY_TRACT
  Filled 2014-08-27 (×6): qty 3

## 2014-08-27 NOTE — Progress Notes (Signed)
ANTICOAGULATION CONSULT NOTE - Initial Consult  Pharmacy Consult for Coumadin (chronic Rx PTA) Indication: mural thrombus  Allergies  Allergen Reactions  . Codeine Other (See Comments)    Reaction during surgical  Procedure-unknown  . Morphine And Related Other (See Comments)    Reaction unknown-during surgical procedure    Patient Measurements: Height: 5\' 9"  (175.3 cm) Weight: 174 lb 6.1 oz (79.1 kg) IBW/kg (Calculated) : 70.7  Vital Signs: Temp: 98.6 F (37 C) (04/12 0635) Temp Source: Oral (04/12 0635) BP: 117/56 mmHg (04/12 0635) Pulse Rate: 72 (04/12 0825)  Labs:  Recent Labs  08/26/14 1727 08/26/14 1810 08/27/14 0609  HGB 16.3  --  15.1  HCT 49.6  --  47.4  PLT 108*  --  99*  LABPROT  --  27.8*  --   INR  --  2.57*  --   CREATININE  --  1.21 1.26  TROPONINI  --  0.03  --    Estimated Creatinine Clearance: 47.5 mL/min (by C-G formula based on Cr of 1.26).  Medical History: Past Medical History  Diagnosis Date  . Coronary disease     WITH REMOTE ANTERIOR MYOCARDIAL INFARCTION  . Mural thrombus of heart     CHRONIC  . Hypertension   . Hyperlipidemia   . CHF (congestive heart failure)     Medications:  Prescriptions prior to admission  Medication Sig Dispense Refill Last Dose  . doxazosin (CARDURA) 2 MG tablet Take 1 tablet (2 mg total) by mouth daily. 30 tablet 6 08/25/2014 at Unknown time  . ezetimibe-simvastatin (VYTORIN) 10-40 MG per tablet Take 1 tablet by mouth at bedtime. 30 tablet 6 08/25/2014 at Unknown time  . furosemide (LASIX) 40 MG tablet Take 1/2 tablet daily (Patient taking differently: Take 20 mg by mouth daily. Take 1/2 tablet daily) 30 tablet 6 08/26/2014 at Unknown time  . metoprolol succinate (TOPROL-XL) 50 MG 24 hr tablet Take 1 tablet by mouth  daily 90 tablet 4 08/26/2014 at 1100a  . nitroGLYCERIN (NITROSTAT) 0.4 MG SL tablet Place 1 tablet (0.4 mg total) under the tongue every 5 (five) minutes as needed. 25 tablet 6 Taking  .  ramipril (ALTACE) 10 MG capsule Take 1 capsule by mouth  daily 90 capsule 4 08/26/2014 at Unknown time  . warfarin (COUMADIN) 5 MG tablet Take as directed from  coumadin clinic (Patient taking differently: Takes one and one-half tablet 3 days weekly, then takes onetablet on all other days) 135 tablet 4 08/25/2014 at Unknown time    Assessment: 81yo male on chronic Coumadin PTA.  INR is therapeutic on admission.  H/H OK.  Platelets low.  Records from Coumadin Clinic noted:  Anticoagulation Monitoring 08/19/2014  INR goal 2.0-3.0  Assoc. INR Date 08/19/2014  Associated INR 3.2  Pt. deviation No  Current weekly dose 45 mg  Current weekly dose (2nd week)   Sunday dose 7.5 mg  Monday dose 5 mg  Tuesday dose 7.5 mg  Wednesday dose 5 mg  Thursday dose 7.5 mg  Friday dose 5 mg  Saturday dose 5 mg  Weekly dose 42.5 mg  Sunday dose (2nd week)   Monday dose (2nd week)   Tuesday dose (2nd week)   Wednesday dose (2nd week)   Thursday dose (2nd week)   Friday dose (2nd week)   Saturday dose (2nd week)   Weekly dose (2nd week)   Dose description No warfarin today Monday April 4, then decrease dose to 1.5 tablets every day except 1 tablet  on Mondays, Wednesdays, Fridays and Saturdays. Recheck INR in 2 weeks. . . .  Return date 09/02/2014  VISIT REPORT    Goal of Therapy:  INR 2-3 Monitor platelets by anticoagulation protocol: Yes   Plan:   Coumadin 7.5mg  po today x 1 per home regimen  INR daily  F/U CBC  [name] 08/27/2014,9:12 AM

## 2014-08-27 NOTE — Progress Notes (Signed)
[facility] PROGRESS NOTE  JACOBY ZANNI TMH:[id number] DOB: July 01, 1932 DOA: 08/26/2014 PCP: Odette Fraction, MD  Assessment/Plan: Community-acquired pneumonia -Continue Levaquin. -Culture data pending.  COPD with acute exacerbation -Less wheezing today. -Continue IV steroids today and transition to by mouth in a.m. -When necessary nebs/oxygen as needed.  Continued tobacco abuse -Cessation counseling provided.  History of cardiac mural thrombus -Continue warfarin.  Suspected CHF, type unknown -Suspected based on elevated BNP. -Echo done with results pending.  Hypertension -Well controlled. -Continue current medications.  Macrocytosis without anemia -Check X-21 and folic acid.  Code Status: Full code Family Communication: Son [name] via phone  Disposition Plan: Home when ready, likely 24-48 hours   Consultants:  None   Antibiotics:  Levaquin   Subjective: Wants to go home to attend to his wife, less short of breath.  Objective: Filed Vitals:   08/27/14 1941 08/27/14 0708 08/27/14 0825 08/27/14 1408  BP: 117/56     Pulse: 76  72   Temp: 98.6 F (37 C)     TempSrc: Oral     Resp:   18   Height:      Weight: 79.1 kg (174 lb 6.1 oz)     SpO2: 95% 96% 96% 92%    Intake/Output Summary (Last 24 hours) at 08/27/14 1434 Last data filed at 08/27/14 1213  Gross per 24 hour  Intake    246 ml  Output   1000 ml  Net   -754 ml   Filed Weights   08/26/14 1623 08/26/14 2203 08/27/14 0635  Weight: 81.647 kg (180 lb) 80 kg (176 lb 5.9 oz) 79.1 kg (174 lb 6.1 oz)    Exam:   General:  Alert, awake, oriented 3, no current distress, strong odor of cigarette smoke on his person.  Cardiovascular: Regular rate and rhythm  Respiratory: Mild bilateral expiratory wheezes and rhonchi  Abdomen: Soft, nontender, nondistended, positive bowel sounds  Extremities: Trace bilateral pitting edema   Neurologic:  Intact and nonfocal  Data  Reviewed: Basic Metabolic Panel:  Recent Labs Lab 08/26/14 1810 08/27/14 0609  NA 142 143  K 4.8 4.4  CL 106 104  CO2 29 30  GLUCOSE 111* 159*  BUN 28* 26*  CREATININE 1.21 1.26  CALCIUM 8.6 8.3*   Liver Function Tests:  Recent Labs Lab 08/27/14 0609  AST 21  ALT 17  ALKPHOS 48  BILITOT 1.6*  PROT 5.6*  ALBUMIN 3.3*   No results for input(s): LIPASE, AMYLASE in the last 168 hours. No results for input(s): AMMONIA in the last 168 hours. CBC:  Recent Labs Lab 08/26/14 1727 08/27/14 0609  WBC 7.6 7.0  NEUTROABS 5.6  --   HGB 16.3 15.1  HCT 49.6 47.4  MCV 102.9* 103.5*  PLT 108* 99*   Cardiac Enzymes:  Recent Labs Lab 08/26/14 1810  TROPONINI 0.03   BNP (last 3 results)  Recent Labs  08/26/14 1810  BNP 656.0*    ProBNP (last 3 results) No results for input(s): PROBNP in the last 8760 hours.  CBG: No results for input(s): GLUCAP in the last 168 hours.  Recent Results (from the past 240 hour(s))  Culture, blood (routine x 2)     Status: None (Preliminary result)   Collection Time: 08/26/14 10:14 PM  Result Value Ref Range Status   Specimen Description BLOOD RIGHT ARM  Final   Special Requests BOTTLES DRAWN AEROBIC AND ANAEROBIC 6CC  Final   Culture NO GROWTH 1 DAY  Final  Report Status PENDING  Incomplete  Culture, blood (routine x 2)     Status: None (Preliminary result)   Collection Time: 08/26/14 10:17 PM  Result Value Ref Range Status   Specimen Description BLOOD RIGHT HAND  Final   Special Requests BOTTLES DRAWN AEROBIC AND ANAEROBIC 6CC  Final   Culture NO GROWTH 1 DAY  Final   Report Status PENDING  Incomplete     Studies: Dg Chest 2 View  08/26/2014   CLINICAL DATA:  Worsening shortness of breath since last week. Patient's given Solu-Medrol 80 mg mg.  EXAM: CHEST  2 VIEW  COMPARISON:  06/23/2006  FINDINGS: The heart is enlarged. Hyperinflation. There are coarse interstitial markings and perihilar peribronchial thickening. More  focal opacity is identified in the medial right lung base consistent with infectious infiltrate. Possible right upper lobe infiltrate also identified. No pulmonary edema.  IMPRESSION: 1. Hyperinflation and bronchitic changes. 2. Infiltrates in the right medial lung base and right upper lobe are likely infectious.   Electronically Signed   By: Nolon Nations M.D.   On: 08/26/2014 17:55    Scheduled Meds: . clonazePAM  0.5 mg Oral QHS  . doxazosin  2 mg Oral Daily  . ezetimibe  10 mg Oral QHS   And  . simvastatin  40 mg Oral QHS  . furosemide  20 mg Intravenous Q12H  . ipratropium-albuterol  3 mL Nebulization TID  . [START ON 08/28/2014] levofloxacin (LEVAQUIN) IV  750 mg Intravenous Q48H  . methylPREDNISolone (SOLU-MEDROL) injection  40 mg Intravenous Q6H  . metoprolol succinate  50 mg Oral Daily  . ramipril  10 mg Oral Daily  . sodium chloride  3 mL Intravenous Q12H  . Warfarin - Pharmacist Dosing Inpatient   Does not apply Q24H   Continuous Infusions:   Principal Problem:   CAP (community acquired pneumonia) Active Problems:   COPD exacerbation   Coronary disease   Mural thrombus of heart   Hypertension   CHF (congestive heart failure)   Tobacco abuse   Long term current use of anticoagulant therapy   Macrocytosis without anemia    Time spent: 25 minutes. Greater than 50% of this time was spent in direct contact with the patient coordinating care.    Lelon Frohlich  [facility] Pager [phone]  If 7PM-7AM, please contact night-coverage at [email or web address], password [facility] 08/27/2014, 2:34 PM  LOS: 1 day

## 2014-08-27 NOTE — Progress Notes (Signed)
ANTIBIOTIC CONSULT NOTE - INITIAL  Pharmacy Consult for Levaquin Indication: CAP  Allergies  Allergen Reactions  . Codeine Other (See Comments)    Reaction during surgical  Procedure-unknown  . Morphine And Related Other (See Comments)    Reaction unknown-during surgical procedure    Patient Measurements: Height: 5\' 9"  (175.3 cm) Weight: 174 lb 6.1 oz (79.1 kg) IBW/kg (Calculated) : 70.7  Vital Signs: Temp: 98.6 F (37 C) (04/12 0635) Temp Source: Oral (04/12 0635) BP: 117/56 mmHg (04/12 0635) Pulse Rate: 72 (04/12 0825) Intake/Output from previous day: 04/11 0701 - 04/12 0700 In: -  Out: 300 [Urine:300] Intake/Output from this shift:    Labs:  Recent Labs  08/26/14 1727 08/26/14 1810 08/27/14 0609  WBC 7.6  --  7.0  HGB 16.3  --  15.1  PLT 108*  --  99*  CREATININE  --  1.21 1.26   Estimated Creatinine Clearance: 47.5 mL/min (by C-G formula based on Cr of 1.26). No results for input(s): VANCOTROUGH, VANCOPEAK, VANCORANDOM, GENTTROUGH, GENTPEAK, GENTRANDOM, TOBRATROUGH, TOBRAPEAK, TOBRARND, AMIKACINPEAK, AMIKACINTROU, AMIKACIN in the last 72 hours.   Microbiology: Recent Results (from the past 720 hour(s))  Culture, blood (routine x 2)     Status: None (Preliminary result)   Collection Time: 08/26/14 10:14 PM  Result Value Ref Range Status   Specimen Description BLOOD RIGHT ARM  Final   Special Requests BOTTLES DRAWN AEROBIC AND ANAEROBIC 6CC  Final   Culture PENDING  Incomplete   Report Status PENDING  Incomplete  Culture, blood (routine x 2)     Status: None (Preliminary result)   Collection Time: 08/26/14 10:17 PM  Result Value Ref Range Status   Specimen Description BLOOD RIGHT HAND  Final   Special Requests BOTTLES DRAWN AEROBIC AND ANAEROBIC 6CC  Final   Culture PENDING  Incomplete   Report Status PENDING  Incomplete    Medical History: Past Medical History  Diagnosis Date  . Coronary disease     WITH REMOTE ANTERIOR MYOCARDIAL INFARCTION  .  Mural thrombus of heart     CHRONIC  . Hypertension   . Hyperlipidemia   . CHF (congestive heart failure)    Anti-infectives    Start     Dose/Rate Route Frequency Ordered Stop   08/28/14 1800  levofloxacin (LEVAQUIN) IVPB 750 mg     750 mg 100 mL/hr over 90 Minutes Intravenous Every 48 hours 08/27/14 0820     08/26/14 2000  levofloxacin (LEVAQUIN) IVPB 750 mg     750 mg 100 mL/hr over 90 Minutes Intravenous  Once 08/26/14 1959 08/26/14 2207      Assessment: 81yo male admitted with SOB, persistent cough, and sputum production.  SCr is at baseline.   Estimated ClCr is borderline for dose adjustment.  Afebrile.  WBC normal.  Goal of Therapy:  Eradicate infection.  Plan:  Levaquin 750mg  IV q48hrs Monitor labs, renal fxn, progress, and cultures  Hart Robinsons A 08/27/2014,9:09 AM

## 2014-08-27 NOTE — Care Management Note (Addendum)
    Page 1 of 1   08/29/2014     3:18:30 PM CARE MANAGEMENT NOTE 08/29/2014  Patient:  Jay Guzman, Jay Guzman   Account Number:  [id number]  Date Initiated:  08/27/2014  Documentation initiated by:  Jolene Provost  Subjective/Objective Assessment:   Pt is from home with wife and son. 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 Pt has no DME's or HH services prior to admission. Pt plans to dsicharge home with self care. No CM needs.     Action/Plan:   Anticipated DC Date:  08/28/2014   Anticipated DC Plan:  HOME/SELF CARE      DC Planning Services  CM consult      PAC Choice  DURABLE MEDICAL EQUIPMENT   Choice offered to / List presented to:  C-1 Patient   DME arranged  [facility]      DME agency  [facility].        Status of service:  Completed, signed off Medicare Important Message given?  YES (If response is "NO", the following Medicare IM given date fields will be blank) Date Medicare IM given:  08/29/2014 Medicare IM given by:  Jolene Provost Date Additional Medicare IM given:   Additional Medicare IM given by:    Discharge Disposition:  HOME/SELF CARE  Per UR Regulation:  Reviewed for med. necessity/level of care/duration of stay  If discussed at [place] of Stay Meetings, dates discussed:    Comments:  08/29/2014 [place], RN, MSN, CM Pt being discharged home today. Pt meets requirements for home O2. Pt chooses [facility]. Terrence Dupont, of [facility] Notified of referral and will obtain pt information from chart and have port O2 tank delivered to pt's room prior to discharge. No further CM needs. 08/27/2014 [place], RN, MSN, CM

## 2014-08-27 NOTE — Care Management Utilization Note (Signed)
UR completed 

## 2014-08-27 NOTE — Progress Notes (Signed)
  Echocardiogram 2D Echocardiogram has been performed.  Jay Guzman 08/27/2014, 2:40 PM

## 2014-08-28 ENCOUNTER — Ambulatory Visit: Payer: Self-pay | Admitting: Family Medicine

## 2014-08-28 DIAGNOSIS — J441 Chronic obstructive pulmonary disease with (acute) exacerbation: Secondary | ICD-10-CM

## 2014-08-28 DIAGNOSIS — D7589 Other specified diseases of blood and blood-forming organs: Secondary | ICD-10-CM

## 2014-08-28 DIAGNOSIS — J9601 Acute respiratory failure with hypoxia: Secondary | ICD-10-CM

## 2014-08-28 LAB — LEGIONELLA ANTIGEN, URINE

## 2014-08-28 LAB — CBC
HCT: 50.2 % (ref 39.0–52.0)
Hemoglobin: 16 g/dL (ref 13.0–17.0)
MCH: 32.9 pg (ref 26.0–34.0)
MCHC: 31.9 g/dL (ref 30.0–36.0)
MCV: 103.3 fL — ABNORMAL HIGH (ref 78.0–100.0)
PLATELETS: 99 10*3/uL — AB (ref 150–400)
RBC: 4.86 MIL/uL (ref 4.22–5.81)
RDW: 14.3 % (ref 11.5–15.5)
WBC: 16 10*3/uL — ABNORMAL HIGH (ref 4.0–10.5)

## 2014-08-28 LAB — VITAMIN B12: VITAMIN B 12: 427 pg/mL (ref 211–911)

## 2014-08-28 LAB — PROTIME-INR
INR: 2.69 — ABNORMAL HIGH (ref 0.00–1.49)
Prothrombin Time: 28.8 seconds — ABNORMAL HIGH (ref 11.6–15.2)

## 2014-08-28 LAB — GLUCOSE, CAPILLARY: Glucose-Capillary: 133 mg/dL — ABNORMAL HIGH (ref 70–99)

## 2014-08-28 LAB — FOLATE RBC
Folate, RBC: >1390 ng/mL (ref >498)
HEMATOCRIT: 44.6 % (ref 37.5–51.0)

## 2014-08-28 MED ORDER — FUROSEMIDE 20 MG PO TABS
20.0000 mg | ORAL_TABLET | Freq: Every day | ORAL | Status: DC
  Administered 2014-08-28 – 2014-08-29 (×2): 20 mg via ORAL
  Filled 2014-08-28 (×2): qty 1

## 2014-08-28 MED ORDER — WARFARIN SODIUM 5 MG PO TABS
5.0000 mg | ORAL_TABLET | Freq: Once | ORAL | Status: AC
  Administered 2014-08-28: 5 mg via ORAL
  Filled 2014-08-28: qty 1

## 2014-08-28 MED ORDER — METHYLPREDNISOLONE SODIUM SUCC 125 MG IJ SOLR
80.0000 mg | Freq: Four times a day (QID) | INTRAMUSCULAR | Status: DC
  Administered 2014-08-28 – 2014-08-29 (×4): 80 mg via INTRAVENOUS
  Filled 2014-08-28 (×4): qty 2

## 2014-08-28 NOTE — Progress Notes (Addendum)
[facility] PROGRESS NOTE  Filed Weights   08/26/14 1623 08/26/14 2203 08/27/14 0635  Weight: 81.647 kg (180 lb) 80 kg (176 lb 5.9 oz) 79.1 kg (174 lb 6.1 oz)        Intake/Output Summary (Last 24 hours) at 08/28/14 1133 Last data filed at 08/28/14 1100  Gross per 24 hour  Intake    600 ml  Output   2750 ml  Net  -2150 ml     Assessment/Plan: Acute respiratory failure with hypoxia due to CAP (community acquired pneumonia)/ COPD exacerbation: - She is on empiric Levaquin. She has remained afebrile her saturations have been good and 90% on 2 L. - he was also wheezing on admission she was started on IV empiric steroids which are causing her to have leukocytosis. - He continues to wheeze, continue IV steroids. - Patient is threatening to leave AMA. I told him is not reasonable to discharge him as he continues to wheeze and is oxygen dependent.  Chronic systolic CHF (congestive heart failure), NYHA class 2 - Her BNP was mildly elevated at 2-D echo done that showed akinesia of multiple walls with normal motion abnormalities. - Has no JVD no lower extremity edema. - Change Lasix to oral regimen.  Macrocytosis without anemia - B12 was 427 folate is pending.   Mural thrombus of heart: - Continue warfarin.  Essential hypertension: - Hold control continue current medications.     Code Status: full Family Communication: none  Disposition Plan: 1-2 days   Consultants:  none  Procedures: ECHO: ef 45%  Antibiotics:  levaquin  HPI/Subjective: Relates his SOB is slightly improved. He relates he needs to go home.  Objective: Filed Vitals:   08/27/14 1941 08/27/14 2136 08/28/14 0542 08/28/14 0903  BP:  124/72 125/58 136/64  Pulse:  71 65 69  Temp:  98 F (36.7 C) 97.9 F (36.6 C)   TempSrc:  Oral Oral   Resp:  20 20   Height:      Weight:      SpO2: 95% 90% 97%      Exam:  General: Alert, awake, oriented x3, in no acute distress.  HEENT: No  bruits, no goiter.  Heart: Regular rate and rhythm, without murmurs, rubs, gallops.  Lungs: Good air movement, bilateral air movement.  Abdomen: Soft, nontender, nondistended, positive bowel sounds.  Neuro: Grossly intact, nonfocal.   Data Reviewed: Basic Metabolic Panel:  Recent Labs Lab 08/26/14 1810 08/27/14 0609  NA 142 143  K 4.8 4.4  CL 106 104  CO2 29 30  GLUCOSE 111* 159*  BUN 28* 26*  CREATININE 1.21 1.26  CALCIUM 8.6 8.3*   Liver Function Tests:  Recent Labs Lab 08/27/14 0609  AST 21  ALT 17  ALKPHOS 48  BILITOT 1.6*  PROT 5.6*  ALBUMIN 3.3*   No results for input(s): LIPASE, AMYLASE in the last 168 hours. No results for input(s): AMMONIA in the last 168 hours. CBC:  Recent Labs Lab 08/26/14 1727 08/27/14 0609 08/28/14 0637  WBC 7.6 7.0 16.0*  NEUTROABS 5.6  --   --   HGB 16.3 15.1 16.0  HCT 49.6 47.4 50.2  MCV 102.9* 103.5* 103.3*  PLT 108* 99* 99*   Cardiac Enzymes:  Recent Labs Lab 08/26/14 1810  TROPONINI 0.03   BNP (last 3 results)  Recent Labs  08/26/14 1810  BNP 656.0*    ProBNP (last 3 results) No results for input(s): PROBNP in the last 8760 hours.  CBG: No  results for input(s): GLUCAP in the last 168 hours.  Recent Results (from the past 240 hour(s))  Culture, blood (routine x 2)     Status: None (Preliminary result)   Collection Time: 08/26/14 10:14 PM  Result Value Ref Range Status   Specimen Description BLOOD RIGHT ARM  Final   Special Requests BOTTLES DRAWN AEROBIC AND ANAEROBIC 6CC  Final   Culture NO GROWTH 2 DAYS  Final   Report Status PENDING  Incomplete  Culture, blood (routine x 2)     Status: None (Preliminary result)   Collection Time: 08/26/14 10:17 PM  Result Value Ref Range Status   Specimen Description BLOOD RIGHT HAND  Final   Special Requests BOTTLES DRAWN AEROBIC AND ANAEROBIC 6CC  Final   Culture NO GROWTH 2 DAYS  Final   Report Status PENDING  Incomplete     Studies: Dg Chest 2  View  08/26/2014   CLINICAL DATA:  Worsening shortness of breath since last week. Patient's given Solu-Medrol 80 mg mg.  EXAM: CHEST  2 VIEW  COMPARISON:  06/23/2006  FINDINGS: The heart is enlarged. Hyperinflation. There are coarse interstitial markings and perihilar peribronchial thickening. More focal opacity is identified in the medial right lung base consistent with infectious infiltrate. Possible right upper lobe infiltrate also identified. No pulmonary edema.  IMPRESSION: 1. Hyperinflation and bronchitic changes. 2. Infiltrates in the right medial lung base and right upper lobe are likely infectious.   Electronically Signed   By: Nolon Nations M.D.   On: 08/26/2014 17:55    Scheduled Meds: . clonazePAM  0.5 mg Oral QHS  . doxazosin  2 mg Oral Daily  . ezetimibe  10 mg Oral QHS   And  . simvastatin  40 mg Oral QHS  . furosemide  20 mg Intravenous Q12H  . ipratropium-albuterol  3 mL Nebulization TID  . levofloxacin (LEVAQUIN) IV  750 mg Intravenous Q48H  . methylPREDNISolone (SOLU-MEDROL) injection  40 mg Intravenous Q6H  . metoprolol succinate  50 mg Oral Daily  . ramipril  10 mg Oral Daily  . sodium chloride  3 mL Intravenous Q12H  . warfarin  5 mg Oral Once  . Warfarin - Pharmacist Dosing Inpatient   Does not apply Q24H   Continuous Infusions:    Charlynne Cousins  [facility] Pager [phone] If 7PM-7AM, please contact night-coverage at [email or web address], password [facility] 08/28/2014, 11:33 AM  LOS: 2 days

## 2014-08-28 NOTE — Progress Notes (Signed)
ANTICOAGULATION CONSULT NOTE - follow up  Pharmacy Consult for Coumadin (chronic Rx PTA) Indication: mural thrombus  Allergies  Allergen Reactions  . Codeine Other (See Comments)    Reaction during surgical  Procedure-unknown  . Morphine And Related Other (See Comments)    Reaction unknown-during surgical procedure   Patient Measurements: Height: 5\' 9"  (175.3 cm) Weight: 174 lb 6.1 oz (79.1 kg) IBW/kg (Calculated) : 70.7  Vital Signs: Temp: 97.9 F (36.6 C) (04/13 0542) Temp Source: Oral (04/13 0542) BP: 136/64 mmHg (04/13 0903) Pulse Rate: 69 (04/13 0903)  Labs:  Recent Labs  08/26/14 1727 08/26/14 1810 08/27/14 0609 08/28/14 0637  HGB 16.3  --  15.1 16.0  HCT 49.6  --  47.4 50.2  PLT 108*  --  99* 99*  LABPROT  --  27.8*  --  28.8*  INR  --  2.57*  --  2.69*  CREATININE  --  1.21 1.26  --   TROPONINI  --  0.03  --   --    Estimated Creatinine Clearance: 47.5 mL/min (by C-G formula based on Cr of 1.26).  Medical History: Past Medical History  Diagnosis Date  . Coronary disease     WITH REMOTE ANTERIOR MYOCARDIAL INFARCTION  . Mural thrombus of heart     CHRONIC  . Hypertension   . Hyperlipidemia   . CHF (congestive heart failure)    Medications:  Prescriptions prior to admission  Medication Sig Dispense Refill Last Dose  . doxazosin (CARDURA) 2 MG tablet Take 1 tablet (2 mg total) by mouth daily. 30 tablet 6 08/25/2014 at Unknown time  . ezetimibe-simvastatin (VYTORIN) 10-40 MG per tablet Take 1 tablet by mouth at bedtime. 30 tablet 6 08/25/2014 at Unknown time  . furosemide (LASIX) 40 MG tablet Take 1/2 tablet daily (Patient taking differently: Take 20 mg by mouth daily. Take 1/2 tablet daily) 30 tablet 6 08/26/2014 at Unknown time  . metoprolol succinate (TOPROL-XL) 50 MG 24 hr tablet Take 1 tablet by mouth  daily 90 tablet 4 08/26/2014 at 1100a  . nitroGLYCERIN (NITROSTAT) 0.4 MG SL tablet Place 1 tablet (0.4 mg total) under the tongue every 5 (five)  minutes as needed. 25 tablet 6 Taking  . ramipril (ALTACE) 10 MG capsule Take 1 capsule by mouth  daily 90 capsule 4 08/26/2014 at Unknown time  . warfarin (COUMADIN) 5 MG tablet Take as directed from  coumadin clinic (Patient taking differently: Takes one and one-half tablet 3 days weekly, then takes onetablet on all other days) 135 tablet 4 08/25/2014 at Unknown time   Assessment: 81yo male on chronic Coumadin PTA.  INR is therapeutic.  H/H OK.  Platelets low but stable.  Records from Coumadin Clinic noted:  Anticoagulation Monitoring 08/19/2014  INR goal 2.0-3.0  Assoc. INR Date 08/19/2014  Associated INR 3.2  Pt. deviation No  Current weekly dose 45 mg  Current weekly dose (2nd week)   Sunday dose 7.5 mg  Monday dose 5 mg  Tuesday dose 7.5 mg  Wednesday dose 5 mg  Thursday dose 7.5 mg  Friday dose 5 mg  Saturday dose 5 mg  Weekly dose 42.5 mg  Sunday dose (2nd week)   Monday dose (2nd week)   Tuesday dose (2nd week)   Wednesday dose (2nd week)   Thursday dose (2nd week)   Friday dose (2nd week)   Saturday dose (2nd week)   Weekly dose (2nd week)   Dose description No warfarin today Monday April 4, then  decrease dose to 1.5 tablets every day except 1 tablet on Mondays, Wednesdays, Fridays and Saturdays. Recheck INR in 2 weeks. . . .  Return date 09/02/2014  VISIT REPORT    Goal of Therapy:  INR 2-3 Monitor platelets by anticoagulation protocol: Yes   Plan:   Coumadin 5mg  po today x 1 per home regimen  INR daily  F/U CBC, Platelets  Mone Commisso A 08/28/2014,11:00 AM

## 2014-08-29 LAB — PROTIME-INR
INR: 6.53 (ref 0.00–1.49)
Prothrombin Time: 57.6 seconds — ABNORMAL HIGH (ref 11.6–15.2)

## 2014-08-29 MED ORDER — WARFARIN SODIUM 5 MG PO TABS: ORAL_TABLET | ORAL | Status: DC

## 2014-08-29 MED ORDER — LEVOFLOXACIN 500 MG PO TABS: 500.0000 mg | ORAL_TABLET | Freq: Every day | ORAL | Status: DC

## 2014-08-29 MED ORDER — PREDNISONE 10 MG PO TABS: ORAL_TABLET | ORAL | Status: DC

## 2014-08-29 NOTE — Care Management Utilization Note (Signed)
UR completed 

## 2014-08-29 NOTE — Progress Notes (Signed)
Patient's oxygen saturation on 02 at 2l is 94% at rest, on room air patient's oxygen level is 86% at rest.

## 2014-08-29 NOTE — Progress Notes (Signed)
Patient states understanding of discharge instructions, prescriptions given 

## 2014-08-29 NOTE — Discharge Summary (Signed)
Physician Discharge Summary  Jay Guzman:[id number] DOB: 1933-01-01 DOA: 08/26/2014  PCP: Odette Fraction, MD  Admit date: 08/26/2014 Discharge date: 08/29/2014  Time spent: 35 minutes  Recommendations for Outpatient Follow-up:  1. Primary care doctor in 2 weeks. 2. Continue oxygen at home. Continue steroids for 2 weeks.  Discharge Diagnoses:  Principal Problem:   CAP (community acquired pneumonia) Active Problems:   Acute respiratory failure with hypoxia   Mural thrombus of heart   Essential hypertension   Chronic systolic CHF (congestive heart failure), NYHA class 2   COPD exacerbation   Macrocytosis without anemia   Tobacco abuse   Long term current use of anticoagulant therapy   Discharge Condition: stable  Diet recommendation: regular  Filed Weights   08/26/14 2203 08/27/14 0635 08/29/14 0654  Weight: 80 kg (176 lb 5.9 oz) 79.1 kg (174 lb 6.1 oz) 77.565 kg (171 lb)    History of present illness:  81 year old male who  has a past medical history of Coronary disease; Mural thrombus of heart; Hypertension; Hyperlipidemia; and CHF (congestive heart failure). Today came to the hospital with chief comment of persistent cough and shortness of breath for past one month. As per patient the pharmacist heard patient wheezing and told him to go to physician. He was seen in the primary care office was given DuoNeb and IM Solu-Medrol without significant improvement, his O2 sats at the office was 80% so he was sent to ED for further evaluation.  Hospital Course:  Acute respiratory failure with hypoxia due to CAP (community acquired pneumonia)/ COPD exacerbation: - He was started on empiric IV Levaquin and IV steroids with inhalers. She has remained afebrile her saturations have been good and 90% on 2 L. - To 2 days of IV antibiotics and IV steroids he remained afebrile. - Continue for 5 additional days. he will also continue steroid taper for 2 weeks.  Chronic systolic  CHF (congestive heart failure), NYHA class 2 - Her BNP was mildly elevated at 2-D echo done that showed akinesia of multiple walls with normal motion abnormalities. - Has no JVD no lower extremity edema. - Change Lasix to oral regimen.  Macrocytosis without anemia - B12 was 427 folate is pending.  Mural thrombus of heart: - Hold warfarin for 2 days follow-up with his cardiologist next week. - Go INR 2-3. - Restart Coumadin on 08/31/2014.  Essential hypertension: - Hold control continue current medications.   Procedures:  Chest x-ray  2-D echo  Consultations:  None  Discharge Exam: Filed Vitals:   08/29/14 0654  BP: 116/57  Pulse: 62  Temp: 98.5 F (36.9 C)  Resp: 20    General: Awake alert and oriented 3 Cardiovascular: Regular rate and rhythm Respiratory: Good air movement clear to auscultation  Discharge Instructions   Discharge Instructions    Diet - low sodium heart healthy    Complete by:  As directed      Increase activity slowly    Complete by:  As directed           Current Discharge Medication List    START taking these medications   Details  levofloxacin (LEVAQUIN) 500 MG tablet Take 1 tablet (500 mg total) by mouth daily. Qty: 5 tablet, Refills: 0    predniSONE (DELTASONE) 10 MG tablet Takes 6 tablets for 2 days, then 5 tablets for 2 days, then 4 tablets for 2 days, then 3 tablets for 2 days, then 2 tabs for 2 days, then 1 tab for  2 days, and then stop. Qty: 42 tablet, Refills: 0      CONTINUE these medications which have NOT CHANGED   Details  doxazosin (CARDURA) 2 MG tablet Take 1 tablet (2 mg total) by mouth daily. Qty: 30 tablet, Refills: 6   Associated Diagnoses: Essential hypertension    ezetimibe-simvastatin (VYTORIN) 10-40 MG per tablet Take 1 tablet by mouth at bedtime. Qty: 30 tablet, Refills: 6   Associated Diagnoses: Coronary artery disease involving native coronary artery of native heart without angina pectoris     furosemide (LASIX) 40 MG tablet Take 1/2 tablet daily Qty: 30 tablet, Refills: 6    metoprolol succinate (TOPROL-XL) 50 MG 24 hr tablet Take 1 tablet by mouth  daily Qty: 90 tablet, Refills: 4    nitroGLYCERIN (NITROSTAT) 0.4 MG SL tablet Place 1 tablet (0.4 mg total) under the tongue every 5 (five) minutes as needed. Qty: 25 tablet, Refills: 6    ramipril (ALTACE) 10 MG capsule Take 1 capsule by mouth  daily Qty: 90 capsule, Refills: 4    warfarin (COUMADIN) 5 MG tablet Take as directed from  coumadin clinic Qty: 135 tablet, Refills: 4       Allergies  Allergen Reactions  . Codeine Other (See Comments)    Reaction during surgical  Procedure-unknown  . Morphine And Related Other (See Comments)    Reaction unknown-during surgical procedure   Follow-up Information    Follow up with [facility], MD In 2 weeks.   Specialty:  Family Medicine   Contact information:   [address] [place] [address] [phone]        The results of significant diagnostics from this hospitalization (including imaging, microbiology, ancillary and laboratory) are listed below for reference.    Significant Diagnostic Studies: Dg Chest 2 View  08/26/2014   CLINICAL DATA:  Worsening shortness of breath since last week. Patient's given Solu-Medrol 80 mg mg.  EXAM: CHEST  2 VIEW  COMPARISON:  06/23/2006  FINDINGS: The heart is enlarged. Hyperinflation. There are coarse interstitial markings and perihilar peribronchial thickening. More focal opacity is identified in the medial right lung base consistent with infectious infiltrate. Possible right upper lobe infiltrate also identified. No pulmonary edema.  IMPRESSION: 1. Hyperinflation and bronchitic changes. 2. Infiltrates in the right medial lung base and right upper lobe are likely infectious.   Electronically Signed   By: Nolon Nations M.D.   On: 08/26/2014 17:55    Microbiology: Recent Results (from the past 240 hour(s))   Culture, blood (routine x 2)     Status: None (Preliminary result)   Collection Time: 08/26/14 10:14 PM  Result Value Ref Range Status   Specimen Description BLOOD RIGHT ARM  Final   Special Requests BOTTLES DRAWN AEROBIC AND ANAEROBIC 6CC  Final   Culture NO GROWTH 2 DAYS  Final   Report Status PENDING  Incomplete  Culture, blood (routine x 2)     Status: None (Preliminary result)   Collection Time: 08/26/14 10:17 PM  Result Value Ref Range Status   Specimen Description BLOOD RIGHT HAND  Final   Special Requests BOTTLES DRAWN AEROBIC AND ANAEROBIC 6CC  Final   Culture NO GROWTH 2 DAYS  Final   Report Status PENDING  Incomplete     Labs: Basic Metabolic Panel:  Recent Labs Lab 08/26/14 1810 08/27/14 0609  NA 142 143  K 4.8 4.4  CL 106 104  CO2 29 30  GLUCOSE 111* 159*  BUN 28*  26*  CREATININE 1.21 1.26  CALCIUM 8.6 8.3*   Liver Function Tests:  Recent Labs Lab 08/27/14 0609  AST 21  ALT 17  ALKPHOS 48  BILITOT 1.6*  PROT 5.6*  ALBUMIN 3.3*   No results for input(s): LIPASE, AMYLASE in the last 168 hours. No results for input(s): AMMONIA in the last 168 hours. CBC:  Recent Labs Lab 08/26/14 1727 08/27/14 0609 08/27/14 1403 08/28/14 0637  WBC 7.6 7.0  --  16.0*  NEUTROABS 5.6  --   --   --   HGB 16.3 15.1  --  16.0  HCT 49.6 47.4 44.6 50.2  MCV 102.9* 103.5*  --  103.3*  PLT 108* 99*  --  99*   Cardiac Enzymes:  Recent Labs Lab 08/26/14 1810  TROPONINI 0.03   BNP: BNP (last 3 results)  Recent Labs  08/26/14 1810  BNP 656.0*    ProBNP (last 3 results) No results for input(s): PROBNP in the last 8760 hours.  CBG:  Recent Labs Lab 08/28/14 2214  GLUCAP 133*       Signed:  Charlynne Cousins  [facility] 08/29/2014, 10:06 AM

## 2014-08-31 LAB — CULTURE, BLOOD (ROUTINE X 2)
Culture: NO GROWTH
Culture: NO GROWTH

## 2014-09-02 ENCOUNTER — Ambulatory Visit (INDEPENDENT_AMBULATORY_CARE_PROVIDER_SITE_OTHER): Payer: Medicare Other | Admitting: Pharmacist Clinician (PhC)/ Clinical Pharmacy Specialist

## 2014-09-02 DIAGNOSIS — Z7901 Long term (current) use of anticoagulants: Secondary | ICD-10-CM | POA: Diagnosis not present

## 2014-09-02 DIAGNOSIS — Z5181 Encounter for therapeutic drug level monitoring: Secondary | ICD-10-CM

## 2014-09-02 DIAGNOSIS — I513 Intracardiac thrombosis, not elsewhere classified: Secondary | ICD-10-CM

## 2014-09-02 DIAGNOSIS — I213 ST elevation (STEMI) myocardial infarction of unspecified site: Secondary | ICD-10-CM

## 2014-09-02 DIAGNOSIS — I219 Acute myocardial infarction, unspecified: Secondary | ICD-10-CM

## 2014-09-02 LAB — POCT INR: INR: 3.3

## 2014-09-04 ENCOUNTER — Telehealth: Payer: Self-pay | Admitting: Pharmacist Clinician (PhC)/ Clinical Pharmacy Specialist

## 2014-09-04 NOTE — Telephone Encounter (Signed)
Pt son called, asking for samples of vytorin 10/40.  Returned call, will leave x 4 wks at front desk for family to pick up

## 2014-09-12 ENCOUNTER — Ambulatory Visit (INDEPENDENT_AMBULATORY_CARE_PROVIDER_SITE_OTHER): Payer: Medicare Other | Admitting: Family Medicine

## 2014-09-12 ENCOUNTER — Encounter: Payer: Self-pay | Admitting: Family Medicine

## 2014-09-12 VITALS — BP 104/58 | HR 68 | Temp 98.2°F | Resp 20 | Ht 69.0 in | Wt 169.0 lb

## 2014-09-12 DIAGNOSIS — J438 Other emphysema: Secondary | ICD-10-CM

## 2014-09-12 DIAGNOSIS — Z09 Encounter for follow-up examination after completed treatment for conditions other than malignant neoplasm: Secondary | ICD-10-CM

## 2014-09-12 MED ORDER — ALBUTEROL SULFATE HFA 108 (90 BASE) MCG/ACT IN AERS: 2.0000 | INHALATION_SPRAY | Freq: Four times a day (QID) | RESPIRATORY_TRACT | Status: DC | PRN

## 2014-09-12 NOTE — Progress Notes (Signed)
Subjective:    Patient ID: Jay Guzman, male    DOB: 23-May-1932, 81 y.o.   MRN: [id number]  HPI  Patient was admitted to the hospital recently for CAP/COPD exacerbation.  I have copied relevant portions of the DC summary below for my reference:  Admit date: 08/26/2014 Discharge date: 08/29/2014  Time spent: 35 minutes  Recommendations for Outpatient Follow-up:  1. Primary care doctor in 2 weeks. 2. Continue oxygen at home. Continue steroids for 2 weeks.  Discharge Diagnoses:  Principal Problem:  CAP (community acquired pneumonia) Active Problems:  Acute respiratory failure with hypoxia  Mural thrombus of heart  Essential hypertension  Chronic systolic CHF (congestive heart failure), NYHA class 2  COPD exacerbation  Macrocytosis without anemia  Tobacco abuse  Long term current use of anticoagulant therapy  History of present illness:  81 year old male who has a past medical history of Coronary disease; Mural thrombus of heart; Hypertension; Hyperlipidemia; and CHF (congestive heart failure). Today came to the hospital with chief comment of persistent cough and shortness of breath for past one month. As per patient the pharmacist heard patient wheezing and told him to go to physician. He was seen in the primary care office was given DuoNeb and IM Solu-Medrol without significant improvement, his O2 sats at the office was 80% so he was sent to ED for further evaluation.  Hospital Course:  Acute respiratory failure with hypoxia due to CAP (community acquired pneumonia)/ COPD exacerbation: - He was started on empiric IV Levaquin and IV steroids with inhalers. She has remained afebrile her saturations have been good and 90% on 2 L. - To 2 days of IV antibiotics and IV steroids he remained afebrile. - Continue for 5 additional days. he will also continue steroid taper for 2 weeks. Chest x-ray showed right middle lobe and right upper lobe pneumonia Chronic  systolic CHF (congestive heart failure), NYHA class 2 - Her BNP was mildly elevated at 2-D echo done that showed akinesia of multiple walls with normal motion abnormalities. - Has no JVD no lower extremity edema. - Change Lasix to oral regimen.  Macrocytosis without anemia - B12 was 427 folate is pending.  Mural thrombus of heart: - Hold warfarin for 2 days follow-up with his cardiologist next week. - Go INR 2-3. - Restart Coumadin on 08/31/2014.  Essential hypertension: - Hold control continue current medications.       START taking these medications   Details  levofloxacin (LEVAQUIN) 500 MG tablet Take 1 tablet (500 mg total) by mouth daily. Qty: 5 tablet, Refills: 0    predniSONE (DELTASONE) 10 MG tablet Takes 6 tablets for 2 days, then 5 tablets for 2 days, then 4 tablets for 2 days, then 3 tablets for 2 days, then 2 tabs for 2 days, then 1 tab for 2 days, and then stop. Qty: 42 tablet, Refills: 0      CONTINUE these medications which have NOT CHANGED   Details  doxazosin (CARDURA) 2 MG tablet Take 1 tablet (2 mg total) by mouth daily. Qty: 30 tablet, Refills: 6   Associated Diagnoses: Essential hypertension    ezetimibe-simvastatin (VYTORIN) 10-40 MG per tablet Take 1 tablet by mouth at bedtime. Qty: 30 tablet, Refills: 6   Associated Diagnoses: Coronary artery disease involving native coronary artery of native heart without angina pectoris    furosemide (LASIX) 40 MG tablet Take 1/2 tablet daily Qty: 30 tablet, Refills: 6    metoprolol succinate (TOPROL-XL) 50 MG 24 hr tablet  Take 1 tablet by mouth daily Qty: 90 tablet, Refills: 4    nitroGLYCERIN (NITROSTAT) 0.4 MG SL tablet Place 1 tablet (0.4 mg total) under the tongue every 5 (five) minutes as needed. Qty: 25 tablet, Refills: 6    ramipril (ALTACE) 10 MG capsule Take 1 capsule by mouth daily Qty: 90 capsule, Refills: 4    warfarin (COUMADIN) 5 MG tablet Take as  directed from coumadin clinic Qty: 135 tablet, Refills: 4          He is here for follow up.  Patient seems extremely winded today. He is on oxygen 2 L via nasal cannula. He is 91% on 2 L via nasal cannula at rest. On examination today he has markedly diminished breath sounds throughout his lungs. There is a prolonged expiratory phase. There is no audible wheezing but I anticipate that the patient has markedly diminished airflow which may be masking the fact the patient has bronchospasms. He is not currently on albuterol or Atrovent or Spiriva or Symbicort. He has finished Levaquin. He has finished his prednisone.  Pulmonary function test revealed:  FEV1 0.69 L which is 24% of predicted, FVC of 1.83 L which is 50% of predicted, an FEV1 to FVC ratio of 38% indicating stage IV very severe COPD.   Past Medical History  Diagnosis Date  . Coronary disease     WITH REMOTE ANTERIOR MYOCARDIAL INFARCTION  . Mural thrombus of heart     CHRONIC  . Hypertension   . Hyperlipidemia   . CHF (congestive heart failure)     Past Surgical History  Procedure Laterality Date  . Cardiac catheterization  2000  . Hernia repair      STATUS POST BILATERAL  . Abdominal aortic aneurysm repair  2008   Current Outpatient Prescriptions on File Prior to Visit  Medication Sig Dispense Refill  . doxazosin (CARDURA) 2 MG tablet Take 1 tablet (2 mg total) by mouth daily. 30 tablet 6  . ezetimibe-simvastatin (VYTORIN) 10-40 MG per tablet Take 1 tablet by mouth at bedtime. 30 tablet 6  . furosemide (LASIX) 40 MG tablet Take 1/2 tablet daily (Patient taking differently: Take 20 mg by mouth daily. Take 1/2 tablet daily) 30 tablet 6  . levofloxacin (LEVAQUIN) 500 MG tablet Take 1 tablet (500 mg total) by mouth daily. 5 tablet 0  . metoprolol succinate (TOPROL-XL) 50 MG 24 hr tablet Take 1 tablet by mouth  daily 90 tablet 4  . nitroGLYCERIN (NITROSTAT) 0.4 MG SL tablet Place 1 tablet (0.4 mg total) under the tongue  every 5 (five) minutes as needed. 25 tablet 6  . ramipril (ALTACE) 10 MG capsule Take 1 capsule by mouth  daily 90 capsule 4  . warfarin (COUMADIN) 5 MG tablet Take as directed from  coumadin clinic 135 tablet 4   No current facility-administered medications on file prior to visit.   Allergies  Allergen Reactions  . Codeine Other (See Comments)    Reaction during surgical  Procedure-unknown  . Morphine And Related Other (See Comments)    Reaction unknown-during surgical procedure   History   Social History  . Marital Status: Married    Spouse Name: N/A  . Number of Children: 2  . 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 Smoking status: Current Every Day Smoker -- 1.00 packs/day for 60 years    Types: Cigarettes  . Smokeless tobacco: Not on file  .  Alcohol Use: No  . Drug Use: No  . Sexual Activity: Not on file   Other Topics Concern  . Not on file   Social History Narrative    Review of Systems  All other systems reviewed and are negative.      Objective:   Physical Exam  Constitutional: He appears well-developed and well-nourished.  Neck: Neck supple. No JVD present.  Cardiovascular: Normal rate, regular rhythm and normal heart sounds.   Pulmonary/Chest: He has decreased breath sounds in the right upper field, the right lower field, the left upper field and the left lower field. He has no wheezes. He has no rhonchi. He has no rales.  Abdominal: Soft. Bowel sounds are normal.  Musculoskeletal: He exhibits no edema.  Lymphadenopathy:    He has no cervical adenopathy.  Vitals reviewed.         Assessment & Plan:  Hospital discharge follow-up  Other emphysema  Patient has severe COPD. While some of his dyspnea is related to his recent pneumonia on top of his chronic medical conditions such as congestive heart failure, I believe that his COPD is suboptimally treated. Therefore I will start the  patient on albuterol 2 puffs inhaled every 6 hours as needed for wheezing and shortness of breath. He will also start maintenance therapy with Stiolto 2 inh qday.  Recheck here in 1 weeks. Patient is not ready to stop oxygen. He is still very short of breath even on oxygen.  I'm going to arrange home health to come in and check his oxygen levels and monitor his medications.  And also follow-up on using the stiolto  as well as the albuterol as I truly feel that this would benefit the patient and help his breathing.

## 2014-09-16 ENCOUNTER — Ambulatory Visit (INDEPENDENT_AMBULATORY_CARE_PROVIDER_SITE_OTHER): Payer: Medicare Other | Admitting: Pharmacist Clinician (PhC)/ Clinical Pharmacy Specialist

## 2014-09-16 DIAGNOSIS — I213 ST elevation (STEMI) myocardial infarction of unspecified site: Secondary | ICD-10-CM | POA: Diagnosis not present

## 2014-09-16 DIAGNOSIS — Z7901 Long term (current) use of anticoagulants: Secondary | ICD-10-CM

## 2014-09-16 DIAGNOSIS — Z5181 Encounter for therapeutic drug level monitoring: Secondary | ICD-10-CM | POA: Diagnosis not present

## 2014-09-16 DIAGNOSIS — I513 Intracardiac thrombosis, not elsewhere classified: Secondary | ICD-10-CM

## 2014-09-16 DIAGNOSIS — I219 Acute myocardial infarction, unspecified: Secondary | ICD-10-CM

## 2014-09-16 LAB — POCT INR: INR: 2.6

## 2014-09-19 ENCOUNTER — Encounter: Payer: Self-pay | Admitting: Family Medicine

## 2014-09-19 ENCOUNTER — Ambulatory Visit (INDEPENDENT_AMBULATORY_CARE_PROVIDER_SITE_OTHER): Payer: Medicare Other | Admitting: Family Medicine

## 2014-09-19 VITALS — BP 110/68 | HR 76 | Temp 97.6°F | Resp 20 | Ht 69.0 in | Wt 174.0 lb

## 2014-09-19 DIAGNOSIS — F0390 Unspecified dementia without behavioral disturbance: Secondary | ICD-10-CM | POA: Insufficient documentation

## 2014-09-19 DIAGNOSIS — J438 Other emphysema: Secondary | ICD-10-CM

## 2014-09-19 NOTE — Progress Notes (Signed)
Subjective:    Patient ID: Jay Guzman, male    DOB: Oct 19, 1932, 81 y.o.   MRN: [id number]  HPI   Patient was admitted to the hospital recently for CAP/COPD exacerbation.  I have copied relevant portions of the DC summary below for my reference:  Admit date: 08/26/2014 Discharge date: 08/29/2014  Time spent: 35 minutes  Recommendations for Outpatient Follow-up:  1. Primary care doctor in 2 weeks. 2. Continue oxygen at home. Continue steroids for 2 weeks.  Discharge Diagnoses:  Principal Problem:  CAP (community acquired pneumonia) Active Problems:  Acute respiratory failure with hypoxia  Mural thrombus of heart  Essential hypertension  Chronic systolic CHF (congestive heart failure), NYHA class 2  COPD exacerbation  Macrocytosis without anemia  Tobacco abuse  Long term current use of anticoagulant therapy  History of present illness:  81 year old male who has a past medical history of Coronary disease; Mural thrombus of heart; Hypertension; Hyperlipidemia; and CHF (congestive heart failure). Today came to the hospital with chief comment of persistent cough and shortness of breath for past one month. As per patient the pharmacist heard patient wheezing and told him to go to physician. He was seen in the primary care office was given DuoNeb and IM Solu-Medrol without significant improvement, his O2 sats at the office was 80% so he was sent to ED for further evaluation.  Hospital Course:  Acute respiratory failure with hypoxia due to CAP (community acquired pneumonia)/ COPD exacerbation: - He was started on empiric IV Levaquin and IV steroids with inhalers. She has remained afebrile her saturations have been good and 90% on 2 L. - To 2 days of IV antibiotics and IV steroids he remained afebrile. - Continue for 5 additional days. he will also continue steroid taper for 2 weeks. Chest x-ray showed right middle lobe and right upper lobe pneumonia Chronic  systolic CHF (congestive heart failure), NYHA class 2 - Her BNP was mildly elevated at 2-D echo done that showed akinesia of multiple walls with normal motion abnormalities. - Has no JVD no lower extremity edema. - Change Lasix to oral regimen.  Macrocytosis without anemia - B12 was 427 folate is pending.  Mural thrombus of heart: - Hold warfarin for 2 days follow-up with his cardiologist next week. - Go INR 2-3. - Restart Coumadin on 08/31/2014.  Essential hypertension: - Hold control continue current medications.       START taking these medications   Details  levofloxacin (LEVAQUIN) 500 MG tablet Take 1 tablet (500 mg total) by mouth daily. Qty: 5 tablet, Refills: 0    predniSONE (DELTASONE) 10 MG tablet Takes 6 tablets for 2 days, then 5 tablets for 2 days, then 4 tablets for 2 days, then 3 tablets for 2 days, then 2 tabs for 2 days, then 1 tab for 2 days, and then stop. Qty: 42 tablet, Refills: 0      CONTINUE these medications which have NOT CHANGED   Details  doxazosin (CARDURA) 2 MG tablet Take 1 tablet (2 mg total) by mouth daily. Qty: 30 tablet, Refills: 6   Associated Diagnoses: Essential hypertension    ezetimibe-simvastatin (VYTORIN) 10-40 MG per tablet Take 1 tablet by mouth at bedtime. Qty: 30 tablet, Refills: 6   Associated Diagnoses: Coronary artery disease involving native coronary artery of native heart without angina pectoris    furosemide (LASIX) 40 MG tablet Take 1/2 tablet daily Qty: 30 tablet, Refills: 6    metoprolol succinate (TOPROL-XL) 50 MG 24 hr  tablet Take 1 tablet by mouth daily Qty: 90 tablet, Refills: 4    nitroGLYCERIN (NITROSTAT) 0.4 MG SL tablet Place 1 tablet (0.4 mg total) under the tongue every 5 (five) minutes as needed. Qty: 25 tablet, Refills: 6    ramipril (ALTACE) 10 MG capsule Take 1 capsule by mouth daily Qty: 90 capsule, Refills: 4    warfarin (COUMADIN) 5 MG tablet Take as  directed from coumadin clinic Qty: 135 tablet, Refills: 4         09/12/14 He is here for follow up.  Patient seems extremely winded today. He is on oxygen 2 L via nasal cannula. He is 91% on 2 L via nasal cannula at rest. On examination today he has markedly diminished breath sounds throughout his lungs. There is a prolonged expiratory phase. There is no audible wheezing but I anticipate that the patient has markedly diminished airflow which may be masking the fact the patient has bronchospasms. He is not currently on albuterol or Atrovent or Spiriva or Symbicort. He has finished Levaquin. He has finished his prednisone.  Pulmonary function test revealed:  FEV1 0.69 L which is 24% of predicted, FVC of 1.83 L which is 50% of predicted, an FEV1 to FVC ratio of 38% indicating stage IV very severe COPD. At that time my plan was: Patient has severe COPD. While some of his dyspnea is related to his recent pneumonia on top of his chronic medical conditions such as congestive heart failure, I believe that his COPD is suboptimally treated. Therefore I will start the patient on albuterol 2 puffs inhaled every 6 hours as needed for wheezing and shortness of breath. He will also start maintenance therapy with Stiolto 2 inh qday.  Recheck here in 1 weeks. Patient is not ready to stop oxygen. He is still very short of breath even on oxygen.  I'm going to arrange home health to come in and check his oxygen levels and monitor his medications.  And also follow-up on using the stiolto  as well as the albuterol as I truly feel that this would benefit the patient and help his breathing.  09/19/14 Patient is here today. He is 94% on 2 L via nasal cannula at rest. He still has markedly diminished breath sounds throughout as well as faint expiratory wheezing. However his breathing is much improved. He is no longer winded with minimal exertion. He seems to be doing better with physical therapy at home. His strength is  improving. Patient does not report any definite benefit since starting stiolto his performance seems to be improving. On Mini-Mental status exam today, the patient is unsure of the date, cannot remember 3 objects, is unable to spell world, and is unable to perform serial sevens. This indicates moderate dementia. I do not believe the patient is safe to be driving   Past Medical History  Diagnosis Date  . Coronary disease     WITH REMOTE ANTERIOR MYOCARDIAL INFARCTION  . Mural thrombus of heart     CHRONIC  . Hypertension   . Hyperlipidemia   . CHF (congestive heart failure)     Past Surgical History  Procedure Laterality Date  . Cardiac catheterization  2000  . Hernia repair      STATUS POST BILATERAL  . Abdominal aortic aneurysm repair  2008   Current Outpatient Prescriptions on File Prior to Visit  Medication Sig Dispense Refill  . albuterol (PROVENTIL HFA;VENTOLIN HFA) 108 (90 BASE) MCG/ACT inhaler Inhale 2 puffs into the lungs  every 6 (six) hours as needed for wheezing or shortness of breath. 1 Inhaler 0  . doxazosin (CARDURA) 2 MG tablet Take 1 tablet (2 mg total) by mouth daily. 30 tablet 6  . ezetimibe-simvastatin (VYTORIN) 10-40 MG per tablet Take 1 tablet by mouth at bedtime. 30 tablet 6  . furosemide (LASIX) 40 MG tablet Take 1/2 tablet daily (Patient taking differently: Take 20 mg by mouth daily. Take 1/2 tablet daily) 30 tablet 6  . levofloxacin (LEVAQUIN) 500 MG tablet Take 1 tablet (500 mg total) by mouth daily. 5 tablet 0  . metoprolol succinate (TOPROL-XL) 50 MG 24 hr tablet Take 1 tablet by mouth  daily 90 tablet 4  . nitroGLYCERIN (NITROSTAT) 0.4 MG SL tablet Place 1 tablet (0.4 mg total) under the tongue every 5 (five) minutes as needed. 25 tablet 6  . ramipril (ALTACE) 10 MG capsule Take 1 capsule by mouth  daily 90 capsule 4  . warfarin (COUMADIN) 5 MG tablet Take as directed from  coumadin clinic 135 tablet 4   No current facility-administered medications on file  prior to visit.   Allergies  Allergen Reactions  . Codeine Other (See Comments)    Reaction during surgical  Procedure-unknown  . Morphine And Related Other (See Comments)    Reaction unknown-during surgical procedure   History   Social History  . Marital Status: Married    Spouse Name: N/A  . Number of Children: 2  . 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 Smoking status: Current Every Day Smoker -- 1.00 packs/day for 60 years    Types: Cigarettes  . Smokeless tobacco: Not on file  . Alcohol Use: No  . Drug Use: No  . Sexual Activity: Not on file   Other Topics Concern  . Not on file   Social History Narrative    Review of Systems  All other systems reviewed and are negative.      Objective:   Physical Exam  Constitutional: He appears well-developed and well-nourished.  Neck: Neck supple. No JVD present.  Cardiovascular: Normal rate, regular rhythm and normal heart sounds.   Pulmonary/Chest: He has decreased breath sounds in the right upper field, the right lower field, the left upper field and the left lower field. He has no wheezes. He has no rhonchi. He has no rales.  Abdominal: Soft. Bowel sounds are normal.  Musculoskeletal: He exhibits no edema.  Lymphadenopathy:    He has no cervical adenopathy.  Vitals reviewed.         Assessment & Plan:  Other emphysema  Continue oxygen 2 L via nasal cannula. Continue Stiolto 2 inhalations daily. Continue to use the albuterol as needed. Continue to work with home physical therapy I would like to recheck the patient here in one month. Hopefully at that time we may be able to get the patient off oxygen. I also explained to the patient that I do not believe he is safe to drive.Marland KitchenMarland Kitchen

## 2014-09-24 ENCOUNTER — Ambulatory Visit (INDEPENDENT_AMBULATORY_CARE_PROVIDER_SITE_OTHER): Payer: Medicare Other | Admitting: Pharmacist Clinician (PhC)/ Clinical Pharmacy Specialist

## 2014-09-24 DIAGNOSIS — Z5181 Encounter for therapeutic drug level monitoring: Secondary | ICD-10-CM

## 2014-09-24 DIAGNOSIS — I513 Intracardiac thrombosis, not elsewhere classified: Secondary | ICD-10-CM

## 2014-09-24 DIAGNOSIS — I213 ST elevation (STEMI) myocardial infarction of unspecified site: Secondary | ICD-10-CM

## 2014-09-24 LAB — POCT INR: INR: 2.3

## 2014-09-26 ENCOUNTER — Encounter: Payer: Self-pay | Admitting: Family Medicine

## 2014-10-01 ENCOUNTER — Telehealth: Payer: Self-pay | Admitting: Family Medicine

## 2014-10-01 ENCOUNTER — Ambulatory Visit (INDEPENDENT_AMBULATORY_CARE_PROVIDER_SITE_OTHER): Payer: Medicare Other | Admitting: Pharmacist Clinician (PhC)/ Clinical Pharmacy Specialist

## 2014-10-01 DIAGNOSIS — I213 ST elevation (STEMI) myocardial infarction of unspecified site: Secondary | ICD-10-CM

## 2014-10-01 DIAGNOSIS — Z5181 Encounter for therapeutic drug level monitoring: Secondary | ICD-10-CM

## 2014-10-01 DIAGNOSIS — I513 Intracardiac thrombosis, not elsewhere classified: Secondary | ICD-10-CM

## 2014-10-01 LAB — POCT INR: INR: 2.2

## 2014-10-01 MED ORDER — TIOTROPIUM BROMIDE-OLODATEROL 2.5-2.5 MCG/ACT IN AERS: 2.0000 | INHALATION_SPRAY | Freq: Every day | RESPIRATORY_TRACT | Status: DC

## 2014-10-01 NOTE — Telephone Encounter (Signed)
[phone] [facility] Pt is needing  stiolto Respimat Inhalation Spray 2.5 mcg 4 grams 28 metered inhalations

## 2014-10-01 NOTE — Telephone Encounter (Signed)
Medication called/sent to requested pharmacy  

## 2014-10-08 LAB — POCT INR: INR: 2.1

## 2014-10-09 ENCOUNTER — Telehealth: Payer: Self-pay | Admitting: Family Medicine

## 2014-10-09 ENCOUNTER — Ambulatory Visit (INDEPENDENT_AMBULATORY_CARE_PROVIDER_SITE_OTHER): Payer: Medicare Other | Admitting: Pharmacist Clinician (PhC)/ Clinical Pharmacy Specialist

## 2014-10-09 DIAGNOSIS — I513 Intracardiac thrombosis, not elsewhere classified: Secondary | ICD-10-CM

## 2014-10-09 DIAGNOSIS — Z5181 Encounter for therapeutic drug level monitoring: Secondary | ICD-10-CM

## 2014-10-09 DIAGNOSIS — I213 ST elevation (STEMI) myocardial infarction of unspecified site: Secondary | ICD-10-CM

## 2014-10-09 NOTE — Telephone Encounter (Signed)
Pt would not tell me what this was in regards to - he implied that it was about You not him and he said it was important for you to only talk to him. Call back on same phone number as below.

## 2014-10-09 NOTE — Telephone Encounter (Signed)
Mr Jay Guzman left message on voicemail requesting speak with Dr Dennard Schaumann nurse - didn't state reason [phone]

## 2014-10-18 ENCOUNTER — Telehealth: Payer: Self-pay | Admitting: Cardiology

## 2014-10-19 LAB — PROTIME-INR: INR: 1.6 — AB (ref 0.9–1.1)

## 2014-10-21 ENCOUNTER — Encounter: Payer: Self-pay | Admitting: Family Medicine

## 2014-10-21 ENCOUNTER — Ambulatory Visit (INDEPENDENT_AMBULATORY_CARE_PROVIDER_SITE_OTHER): Payer: Medicare Other | Admitting: Family Medicine

## 2014-10-21 VITALS — BP 160/78 | HR 68 | Temp 97.9°F | Resp 22 | Ht 69.0 in | Wt 178.0 lb

## 2014-10-21 DIAGNOSIS — J438 Other emphysema: Secondary | ICD-10-CM | POA: Diagnosis not present

## 2014-10-21 DIAGNOSIS — M25561 Pain in right knee: Secondary | ICD-10-CM

## 2014-10-21 NOTE — Progress Notes (Signed)
Subjective:    Patient ID: Jay Guzman, male    DOB: [birth year], 81 y.o.   MRN: [id number]  HPI  09/12/14 He is here for follow up.  Patient seems extremely winded today. He is on oxygen 2 L via nasal cannula. He is 91% on 2 L via nasal cannula at rest. On examination today he has markedly diminished breath sounds throughout his lungs. There is a prolonged expiratory phase. There is no audible wheezing but I appreciate that the patient has markedly diminished airflow which may be masking the fact the patient has bronchospasms. He is not currently on albuterol or Atrovent or Spiriva or Symbicort. He has finished Levaquin. He has finished his prednisone.  Pulmonary function test revealed:  FEV1 0.69 L which is 24% of predicted, FVC of 1.83 L which is 50% of predicted, an FEV1 to FVC ratio of 38% indicating stage IV very severe COPD. At that time my plan was: Patient has severe COPD. While some of his dyspnea is related to his recent pneumonia on top of his chronic medical conditions such as congestive heart failure, I believe that his COPD is suboptimally treated. Therefore I will start the patient on albuterol 2 puffs inhaled every 6 hours as needed for wheezing and shortness of breath. He will also start maintenance therapy with Stiolto 2 inh qday.  Recheck here in 1 weeks. Patient is not ready to stop oxygen. He is still very short of breath even on oxygen.  I'm going to arrange home health to come in and check his oxygen levels and monitor his medications.  And also follow-up on using the stiolto  as well as the albuterol as I truly feel that this would benefit the patient and help his breathing.  09/19/14 Patient is here today. He is 94% on 2 L via nasal cannula at rest. He still has markedly diminished breath sounds throughout as well as faint expiratory wheezing. However his breathing is much improved. He is no longer winded with minimal exertion. He seems to be doing better with physical  therapy at home. His strength is improving. Patient does not report any definite benefit since starting stiolto his performance seems to be improving. On Mini-Mental status exam today, the patient is unsure of the date, cannot remember 3 objects, is unable to spell world, and is unable to perform serial sevens. This indicates moderate dementia. I do not believe the patient is safe to be driving.  At that time, my plan was: Continue oxygen 2 L via nasal cannula. Continue Stiolto 2 inhalations daily. Continue to use the albuterol as needed. Continue to work with home physical therapy I would like to recheck the patient here in one month. Hopefully at that time we may be able to get the patient off oxygen. I also explained to the patient that I do not believe he is safe to drive.  10/21/14 He is here today alone.  He is not wearing oxygen.  His pulse oximetry is 91% on RA.  He states that he is using stiolto but I question his compliance based on his dementia.  He is asking about driving and wants to take a road test as he questions my decision regarding driving.  He also reports severe right knee pain. Pain is located in the posterior aspect of the medial right joint line. He has a positive Apley grind  Past Medical History  Diagnosis Date  . Coronary disease     WITH REMOTE ANTERIOR MYOCARDIAL  INFARCTION  . Mural thrombus of heart     CHRONIC  . Hypertension   . Hyperlipidemia   . CHF (congestive heart failure)   . Dementia     Past Surgical History  Procedure Laterality Date  . Cardiac catheterization  2000  . Hernia repair      STATUS POST BILATERAL  . Abdominal aortic aneurysm repair  2008   Current Outpatient Prescriptions on File Prior to Visit  Medication Sig Dispense Refill  . albuterol (PROVENTIL HFA;VENTOLIN HFA) 108 (90 BASE) MCG/ACT inhaler Inhale 2 puffs into the lungs every 6 (six) hours as needed for wheezing or shortness of breath. 1 Inhaler 0  . doxazosin (CARDURA) 2 MG  tablet Take 1 tablet (2 mg total) by mouth daily. 30 tablet 6  . ezetimibe-simvastatin (VYTORIN) 10-40 MG per tablet Take 1 tablet by mouth at bedtime. 30 tablet 6  . furosemide (LASIX) 40 MG tablet Take 1/2 tablet daily (Patient taking differently: Take 20 mg by mouth daily. Take 1/2 tablet daily) 30 tablet 6  . levofloxacin (LEVAQUIN) 500 MG tablet Take 1 tablet (500 mg total) by mouth daily. 5 tablet 0  . metoprolol succinate (TOPROL-XL) 50 MG 24 hr tablet Take 1 tablet by mouth  daily 90 tablet 4  . nitroGLYCERIN (NITROSTAT) 0.4 MG SL tablet Place 1 tablet (0.4 mg total) under the tongue every 5 (five) minutes as needed. 25 tablet 6  . ramipril (ALTACE) 10 MG capsule Take 1 capsule by mouth  daily 90 capsule 4  . Tiotropium Bromide-Olodaterol (STIOLTO RESPIMAT) 2.5-2.5 MCG/ACT AERS Inhale 2 puffs into the lungs daily. 4 g 5  . warfarin (COUMADIN) 5 MG tablet Take as directed from  coumadin clinic 135 tablet 4   No current facility-administered medications on file prior to visit.   Allergies  Allergen Reactions  . Codeine Other (See Comments)    Reaction during surgical  Procedure-unknown  . Morphine And Related Other (See Comments)    Reaction unknown-during surgical procedure   History   Social History  . Marital Status: Married    Spouse Name: N/A  . Number of Children: 2  . 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 Smoking status: Current Every Day Smoker -- 1.00 packs/day for 60 years    Types: Cigarettes  . Smokeless tobacco: Not on file  . Alcohol Use: No  . Drug Use: No  . Sexual Activity: Not on file   Other Topics Concern  . Not on file   Social History Narrative    Review of Systems  All other systems reviewed and are negative.      Objective:   Physical Exam  Constitutional: He appears well-developed and well-nourished.  Neck: Neck supple. No JVD present.  Cardiovascular: Normal rate, regular  rhythm and normal heart sounds.   Pulmonary/Chest: He has decreased breath sounds in the right upper field, the right lower field, the left upper field and the left lower field. He has no wheezes. He has no rhonchi. He has no rales.  Abdominal: Soft. Bowel sounds are normal.  Musculoskeletal: He exhibits no edema.  Lymphadenopathy:    He has no cervical adenopathy.  Vitals reviewed.         Assessment & Plan:  Other emphysema  Right knee pain  I recommended the patient continue to wear oxygen with ambulation 2 L via nasal cannula. I recommended that he be compliant and  use stiolto 2 inhalations daily. I recommended smoking cessation although he has no plans to quit smoking. I believe the pain in his right knee is due to osteoarthritis coupled with a possible meniscal tear. Using sterile technique, I injected the right knee with 2 mL of lidocaine, 2 mL of Marcaine, and 2 mL of 40 mg per mL Kenalog. The patient tolerated the procedure well without complication. He is adamant that he wants to drive however I believe his dementia is such that he should not be driving. Therefore the patient to the [facility] and see if they can arrange a road test with an occupational therapist.

## 2014-10-22 NOTE — Telephone Encounter (Signed)
Close encounter 

## 2014-11-07 NOTE — Telephone Encounter (Signed)
Received a call from patient he will be bringing driver's license form to office 11/08/14 for Dr.Jordan to fill out.Advised Dr.Jordan will be back in office 11/11/14.

## 2014-11-11 ENCOUNTER — Telehealth: Payer: Self-pay

## 2014-11-11 NOTE — Telephone Encounter (Signed)
Received a call from patient's son Tharon Aquas.Son requested I speak to father.Advised father seemed confused when I spoke to him earlier this morning.Patient insisted he had appointment with Dr.Jordan today at 2:00 pm and he does not have appointment with Dr.Jordan.Advised father needs appointment to see PCP about confusion and about arm bleeding.

## 2014-11-11 NOTE — Telephone Encounter (Signed)
Patient's son [name] called informed son father called and spoke to me this morning.Advised son father seemed confused jumping from one topic to another.Patient stated he had a appointment with Dr.Jordan at 2:00 pm today and pt does not have appointment with Dr.Jordan.Advised son patient needs to see PCP about arm bleeding and about being confused.

## 2014-11-11 NOTE — Telephone Encounter (Signed)
Received a call from patient he stated he spoke to [place] and will not be able to keep appointment with Dr.Jordan at 2:00 pm today.Advised pt he does not have a appointment today at 2:00 pm.Patient seemed confused jumping from one topic to another,asking did he have to take clothes off for his physical.Pt was told he does not have appointment with Dr.Jordan.Then pt states while visiting his sister in nursing home yesterday bathroom door hit him on arm and tore skin off.Stated he was going back to nursing home to have same man redress arm it started bleeding again last night.Advised he needs to see PCP.Pt refused stating he was going back to nursing home.Patient then started saying he needed to hand form directly to [place] he has 60 days to have driver's license form completed and he is suppose to hand directly to [place].Advised he can drop form off at office to have completed.

## 2014-11-13 ENCOUNTER — Telehealth: Payer: Self-pay | Admitting: Cardiology

## 2014-11-13 NOTE — Telephone Encounter (Signed)
Returned call to patient's son [name] spoke to pateint's wife vytorin 10/40 mg samples left at front desk of [facility] office.

## 2014-11-13 NOTE — Telephone Encounter (Signed)
Pt wants to know if we have any samples of Vytorin please.

## 2014-11-19 ENCOUNTER — Other Ambulatory Visit: Payer: Self-pay | Admitting: Family Medicine

## 2014-11-25 ENCOUNTER — Encounter: Payer: Self-pay | Admitting: Cardiology

## 2014-11-26 ENCOUNTER — Ambulatory Visit: Payer: Medicare Other

## 2014-11-26 ENCOUNTER — Ambulatory Visit: Payer: Medicare Other | Admitting: Cardiology

## 2014-11-28 ENCOUNTER — Ambulatory Visit: Payer: Medicare Other | Admitting: Cardiology

## 2014-12-05 ENCOUNTER — Telehealth: Payer: Self-pay | Admitting: Family Medicine

## 2014-12-05 NOTE — Telephone Encounter (Signed)
Pt dementia is worsening.  Is out of control.  Stays up all night, makes various phone calls to "whoever"  Goes outside everyday "in the heat of the day" and does yard work.  Has stopped wearing his Oxygen.  Has started chain smoking again.  They can't convince him to come see you because you don't want him to drive.  And is still driving, will leave house and be gone for hours. Sons are lost and don't know what to do??  Any advise??

## 2014-12-05 NOTE — Telephone Encounter (Signed)
Patient needs placement in a SNF.  They can have him deemed medically incompetent by a magistrate and then seek placement in a snf.  Unfortunately, there is no medication that will correct this behavior.

## 2014-12-05 NOTE — Telephone Encounter (Signed)
Spoke with son [name].  Explain Father probably needs placement in a Memory Care type facility.  They, the family, need to contact [facility] and see how they need to have him deemed "Medically Incompetent"  May need to call an attorney that specializes in [facility] law.  Need to, to the best of their ability, keep him out of this heat.  They need to take his car keys away from him.  He wants [place] to notify the [facility] and have his license revoked.  I gave him the number to [facility] for some extra assistance they may have for family.  Per [place], [place] has already been notified to revoke driving license.

## 2014-12-06 ENCOUNTER — Telehealth: Payer: Self-pay | Admitting: Cardiology

## 2014-12-06 NOTE — Telephone Encounter (Signed)
Please call,you can call his son.

## 2014-12-06 NOTE — Telephone Encounter (Signed)
Returned call to patient.Hard to understand what he really needed.Patient rambled from one topic to another.He kept saying he was coming to office Monday 12/09/14 and would be willing to sit and wait to see Dr.Jordan.Advised Dr.Jordan not in office next week.Patient insisted he would come and wait this Monday.Patient was told he has appointment with Dr.Jordan 01/06/15 at 10:30 am.Patient started saying all he needed was a prescription.He could not tell me what medication he needed.Advised I will call pharmacist and call you back.  Spoke to pharmacist at [address]she stated patient has refills on his medication.Returned call to patient.I told pt he had refills on all of his medications.He never acknowledged what I was telling him he just starts telling me he had plenty of home grown vegetables for me to come to his house to pick up.I tried to explain he had medication refills and he has appointment with Dr.Jordan 01/06/15 at 10:30 am.Patient seemed very confused and never understood what I was telling him.   Patient's son Jay Guzman was called no answer.Left message on personal voice mail to call me.

## 2014-12-09 NOTE — Telephone Encounter (Signed)
Returning Jay Guzman's message from Friday.

## 2014-12-09 NOTE — Telephone Encounter (Signed)
Per son [name] his father gets confused every spring and call his doctors offices numerous times a week.  He doubts his dad remembers calling Friday.  PCP has advised them to take away his keys because he should not be driving but he says he is not able to do that.  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  Son is suggesting we ignore his fathers calls but I told him we can not do that, we will follow-up on any calls his father makes.  Voiced understanding.

## 2014-12-10 ENCOUNTER — Emergency Department (HOSPITAL_COMMUNITY)
Admission: EM | Admit: 2014-12-10 | Discharge: 2014-12-10 | Disposition: A | Discharge status: Home / Self Care | Payer: Medicare Other | Attending: Emergency Medicine | Admitting: Emergency Medicine

## 2014-12-10 ENCOUNTER — Encounter (HOSPITAL_COMMUNITY): Payer: Self-pay

## 2014-12-10 DIAGNOSIS — I509 Heart failure, unspecified: Secondary | ICD-10-CM | POA: Insufficient documentation

## 2014-12-10 DIAGNOSIS — F0390 Unspecified dementia without behavioral disturbance: Secondary | ICD-10-CM | POA: Diagnosis not present

## 2014-12-10 DIAGNOSIS — H6121 Impacted cerumen, right ear: Secondary | ICD-10-CM

## 2014-12-10 DIAGNOSIS — Z720 Tobacco use: Secondary | ICD-10-CM | POA: Diagnosis not present

## 2014-12-10 DIAGNOSIS — I2510 Atherosclerotic heart disease of native coronary artery without angina pectoris: Secondary | ICD-10-CM | POA: Insufficient documentation

## 2014-12-10 DIAGNOSIS — I10 Essential (primary) hypertension: Secondary | ICD-10-CM | POA: Diagnosis not present

## 2014-12-10 DIAGNOSIS — Z79899 Other long term (current) drug therapy: Secondary | ICD-10-CM | POA: Diagnosis not present

## 2014-12-10 DIAGNOSIS — E785 Hyperlipidemia, unspecified: Secondary | ICD-10-CM | POA: Diagnosis not present

## 2014-12-10 DIAGNOSIS — H9191 Unspecified hearing loss, right ear: Secondary | ICD-10-CM | POA: Diagnosis present

## 2014-12-10 MED ORDER — HYDROGEN PEROXIDE 3 % EX SOLN
CUTANEOUS | Status: AC
  Filled 2014-12-10: qty 473

## 2014-12-10 NOTE — ED Provider Notes (Signed)
CSN: [id number]     Arrival date & time 12/10/14  1743 History   First MD Initiated Contact with Patient 12/10/14 1759     Chief Complaint  Patient presents with  . ears stopped up      (Consider location/radiation/quality/duration/timing/severity/associated sxs/prior Treatment) HPI Comments: Patient is a 81 year old male with history of being hard of hearing. He presents with both years being "stopped up". He is having a difficult time hearing out of his right ear which is usually his good ear. He denies any ear pain. He denies any drainage. Denies any fevers or chills He tried to clean his ear out with a Q-tip, however this made it worse..  Patient is a 81 y.o. male presenting with plugged ear sensation. The history is provided by the patient.  Ear Fullness This is a new problem. The current episode started 2 days ago. The problem occurs constantly. The problem has not changed since onset.Nothing aggravates the symptoms. Nothing relieves the symptoms. He has tried nothing for the symptoms.    Past Medical History  Diagnosis Date  . Coronary disease     WITH REMOTE ANTERIOR MYOCARDIAL INFARCTION  . Mural thrombus of heart     CHRONIC  . Hypertension   . Hyperlipidemia   . CHF (congestive heart failure)   . Dementia    Past Surgical History  Procedure Laterality Date  . Cardiac catheterization  2000  . Hernia repair      STATUS POST BILATERAL  . Abdominal aortic aneurysm repair  2008   Family History  Problem Relation Age of Onset  . Cerebral aneurysm Father   . Cancer Sister   . Cancer Mother    History  Substance Use Topics  . Smoking status: Current Every Day Smoker -- 1.00 packs/day for 60 years    Types: Cigarettes  . Smokeless tobacco: Not on file  . Alcohol Use: No    Review of Systems  All other systems reviewed and are negative.     Allergies  Codeine and Morphine and related  Home Medications   Prior to Admission medications   Medication Sig  Start Date End Date Taking? Authorizing Provider  doxazosin (CARDURA) 2 MG tablet Take 1 tablet (2 mg total) by mouth daily. 05/24/14   Peter M Martinique, MD  ezetimibe-simvastatin (VYTORIN) 10-40 MG per tablet Take 1 tablet by mouth at bedtime. 05/24/14   Peter M Martinique, MD  furosemide (LASIX) 40 MG tablet Take 1/2 tablet daily Patient taking differently: Take 20 mg by mouth daily. Take 1/2 tablet daily 05/24/14   Peter M Martinique, MD  levofloxacin (LEVAQUIN) 500 MG tablet Take 1 tablet (500 mg total) by mouth daily. 08/29/14   Charlynne Cousins, MD  metoprolol succinate (TOPROL-XL) 50 MG 24 hr tablet Take 1 tablet by mouth  daily 07/01/14   Lendon Colonel, NP  nitroGLYCERIN (NITROSTAT) 0.4 MG SL tablet Place 1 tablet (0.4 mg total) under the tongue every 5 (five) minutes as needed. 09/04/13   Peter M Martinique, MD  PROAIR HFA 108 [phone] BASE) MCG/ACT inhaler INHALE 2 PUFFS INTO THE LUNGS EVERY 6 HOURS AS NEEDED FOR WHEEZING OR SHORTNESS OF BREATH. 11/19/14   Susy Frizzle, MD  ramipril (ALTACE) 10 MG capsule Take 1 capsule by mouth  daily 07/01/14   Lendon Colonel, NP  Tiotropium Bromide-Olodaterol (STIOLTO RESPIMAT) 2.5-2.5 MCG/ACT AERS Inhale 2 puffs into the lungs daily. 10/01/14   Susy Frizzle, MD  warfarin (COUMADIN) 5 MG tablet  Take as directed from  coumadin clinic 08/31/14   Charlynne Cousins, MD   BP 130/66 mmHg  Pulse 90  Temp(Src) 98 F (36.7 C) (Oral)  Resp 18  Ht 5\' 9"  (1.753 m)  Wt 178 lb (80.74 kg)  BMI 26.27 kg/m2  SpO2 95% Physical Exam  Constitutional: He is oriented to person, place, and time. He appears well-developed and well-nourished. No distress.  HENT:  Head: Normocephalic and atraumatic.  The left TM is clear. There is no canal drainage or swelling. There is no pain with speculum insertion.  The right TM is obscured by wax. There appears to be a cerumen impaction.  Neck: Normal range of motion. Neck supple.  Neurological: He is alert and oriented to person, place,  and time.  Skin: Skin is warm and dry. He is not diaphoretic.  Nursing note and vitals reviewed.   ED Course  Procedures (including critical care time) Labs Review Labs Reviewed - No data to display  Imaging Review No results found.   EKG Interpretation None      MDM    The right ear was irrigated of the cerumen impaction and patient is feeling improved. Will be discharged to home.    Veryl Speak, MD 12/10/14 2023

## 2014-12-10 NOTE — ED Notes (Signed)
Pt c/o both ears being stopped up.  Pt very hard of hearing.

## 2014-12-10 NOTE — ED Notes (Signed)
EDP in room with pt 

## 2014-12-10 NOTE — Discharge Instructions (Signed)
Cerumen Impaction °A cerumen impaction is when the wax in your ear forms a plug. This plug usually causes reduced hearing. Sometimes it also causes an earache or dizziness. Removing a cerumen impaction can be difficult and painful. The wax sticks to the ear canal. The canal is sensitive and bleeds easily. If you try to remove a heavy wax buildup with a cotton tipped swab, you may push it in further. °Irrigation with water, suction, and small ear curettes may be used to clear out the wax. If the impaction is fixed to the skin in the ear canal, ear drops may be needed for a few days to loosen the wax. People who build up a lot of wax frequently can use ear wax removal products available in your local drugstore. °SEEK MEDICAL CARE IF:  °You develop an earache, increased hearing loss, or marked dizziness. °Document Released: 06/10/2004 Document Revised: 07/26/2011 Document Reviewed: 07/31/2009 °ExitCare® Patient Information ©2015 [employer]. This information is not intended to replace advice given to you by your health care provider. Make sure you discuss any questions you have with your health care provider. ° °

## 2014-12-10 NOTE — ED Notes (Signed)
Patient verbalizes understanding of discharge instructions, home care and follow up care. Patient out of department at this time. 

## 2014-12-16 ENCOUNTER — Ambulatory Visit: Payer: Medicare Other | Admitting: Cardiology

## 2014-12-18 ENCOUNTER — Other Ambulatory Visit: Payer: Self-pay

## 2014-12-18 DIAGNOSIS — I2510 Atherosclerotic heart disease of native coronary artery without angina pectoris: Secondary | ICD-10-CM

## 2014-12-18 MED ORDER — EZETIMIBE-SIMVASTATIN 10-40 MG PO TABS: 1.0000 | ORAL_TABLET | Freq: Every day | ORAL | Status: DC

## 2014-12-19 ENCOUNTER — Telehealth: Payer: Self-pay | Admitting: Family Medicine

## 2014-12-19 NOTE — Telephone Encounter (Signed)
Please notify social services that the patient is not fit to be alone and see if social work can help (?elder neglect)
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
## 2014-12-20 ENCOUNTER — Other Ambulatory Visit: Payer: Self-pay

## 2014-12-20 DIAGNOSIS — I2510 Atherosclerotic heart disease of native coronary artery without angina pectoris: Secondary | ICD-10-CM

## 2014-12-20 MED ORDER — EZETIMIBE-SIMVASTATIN 10-40 MG PO TABS: 1.0000 | ORAL_TABLET | Freq: Every day | ORAL | Status: DC

## 2014-12-23 NOTE — Telephone Encounter (Signed)
Patient is medically incompetent to make decisions.  He needs to be involuntarily committed to behavioral health. If he is threatening the family with violence that is the reason to have him committed. Because of his dementia he is not safe. Family needs to take him to behavioral health themselves or call the police and have him taken to behavioral health as he is medically incompetent to make his own decisions and have him involuntarily committed if he is a danger to himself and to others.
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
## 2014-12-24 ENCOUNTER — Telehealth: Payer: Self-pay
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
## 2014-12-24 NOTE — Telephone Encounter (Signed)
Family was in the office today and spoke to Dr Samella Parr nurse about course of action

## 2014-12-25 ENCOUNTER — Emergency Department (HOSPITAL_COMMUNITY)
Admission: EM | Admit: 2014-12-25 | Discharge: 2014-12-26 | Disposition: A | Discharge status: Other Acute Inpatient Hospital | Payer: Medicare Other | Attending: Emergency Medicine | Admitting: Emergency Medicine

## 2014-12-25 ENCOUNTER — Emergency Department (HOSPITAL_COMMUNITY): Payer: Medicare Other

## 2014-12-25 ENCOUNTER — Ambulatory Visit: Payer: Medicare Other | Admitting: Family Medicine

## 2014-12-25 ENCOUNTER — Encounter (HOSPITAL_COMMUNITY): Payer: Self-pay | Admitting: Emergency Medicine

## 2014-12-25 ENCOUNTER — Telehealth: Payer: Self-pay

## 2014-12-25 DIAGNOSIS — F22 Delusional disorders: Secondary | ICD-10-CM | POA: Diagnosis not present

## 2014-12-25 DIAGNOSIS — Z79899 Other long term (current) drug therapy: Secondary | ICD-10-CM | POA: Diagnosis not present

## 2014-12-25 DIAGNOSIS — I2510 Atherosclerotic heart disease of native coronary artery without angina pectoris: Secondary | ICD-10-CM | POA: Insufficient documentation

## 2014-12-25 DIAGNOSIS — E785 Hyperlipidemia, unspecified: Secondary | ICD-10-CM | POA: Insufficient documentation

## 2014-12-25 DIAGNOSIS — I10 Essential (primary) hypertension: Secondary | ICD-10-CM | POA: Insufficient documentation

## 2014-12-25 DIAGNOSIS — I509 Heart failure, unspecified: Secondary | ICD-10-CM | POA: Insufficient documentation

## 2014-12-25 DIAGNOSIS — Z720 Tobacco use: Secondary | ICD-10-CM | POA: Diagnosis not present

## 2014-12-25 DIAGNOSIS — F0391 Unspecified dementia with behavioral disturbance: Secondary | ICD-10-CM | POA: Diagnosis not present

## 2014-12-25 LAB — PROTIME-INR
INR: 1.42 (ref 0.00–1.49)
Prothrombin Time: 17.4 seconds — ABNORMAL HIGH (ref 11.6–15.2)

## 2014-12-25 LAB — URINALYSIS, ROUTINE W REFLEX MICROSCOPIC
BILIRUBIN URINE: NEGATIVE
Glucose, UA: NEGATIVE mg/dL
Hgb urine dipstick: NEGATIVE
Ketones, ur: NEGATIVE mg/dL
LEUKOCYTES UA: NEGATIVE
NITRITE: NEGATIVE
PH: 5.5 (ref 5.0–8.0)
Protein, ur: NEGATIVE mg/dL
Specific Gravity, Urine: >1.03 — ABNORMAL HIGH (ref 1.005–1.030)
UROBILINOGEN UA: 2 mg/dL — AB (ref 0.0–1.0)

## 2014-12-25 LAB — COMPREHENSIVE METABOLIC PANEL
ALK PHOS: 49 U/L (ref 38–126)
ALT: 17 U/L (ref 17–63)
ANION GAP: 8 (ref 5–15)
AST: 21 U/L (ref 15–41)
Albumin: 3.5 g/dL (ref 3.5–5.0)
BUN: 25 mg/dL — AB (ref 6–20)
CO2: 33 mmol/L — AB (ref 22–32)
Calcium: 8.4 mg/dL — ABNORMAL LOW (ref 8.9–10.3)
Chloride: 95 mmol/L — ABNORMAL LOW (ref 101–111)
Creatinine, Ser: 1.08 mg/dL (ref 0.61–1.24)
Glucose, Bld: 116 mg/dL — ABNORMAL HIGH (ref 65–99)
Potassium: 4.1 mmol/L (ref 3.5–5.1)
Sodium: 136 mmol/L (ref 135–145)
TOTAL PROTEIN: 5.7 g/dL — AB (ref 6.5–8.1)
Total Bilirubin: 1.7 mg/dL — ABNORMAL HIGH (ref 0.3–1.2)

## 2014-12-25 LAB — CBC WITH DIFFERENTIAL/PLATELET
Basophils Absolute: 0 10*3/uL (ref 0.0–0.1)
Basophils Relative: 0 % (ref 0–1)
Eosinophils Absolute: 0.2 10*3/uL (ref 0.0–0.7)
Eosinophils Relative: 2 % (ref 0–5)
HCT: 51.2 % (ref 39.0–52.0)
Hemoglobin: 17.1 g/dL — ABNORMAL HIGH (ref 13.0–17.0)
Lymphocytes Relative: 11 % — ABNORMAL LOW (ref 12–46)
Lymphs Abs: 0.9 10*3/uL (ref 0.7–4.0)
MCH: 33.8 pg (ref 26.0–34.0)
MCHC: 33.4 g/dL (ref 30.0–36.0)
MCV: 101.2 fL — AB (ref 78.0–100.0)
Monocytes Absolute: 0.8 10*3/uL (ref 0.1–1.0)
Monocytes Relative: 10 % (ref 3–12)
Neutro Abs: 6.7 10*3/uL (ref 1.7–7.7)
Neutrophils Relative %: 77 % (ref 43–77)
Platelets: 119 10*3/uL — ABNORMAL LOW (ref 150–400)
RBC: 5.06 MIL/uL (ref 4.22–5.81)
RDW: 15.5 % (ref 11.5–15.5)
WBC: 8.7 10*3/uL (ref 4.0–10.5)

## 2014-12-25 LAB — ETHANOL

## 2014-12-25 LAB — RAPID URINE DRUG SCREEN, HOSP PERFORMED
AMPHETAMINES: NOT DETECTED
Barbiturates: NOT DETECTED
Benzodiazepines: NOT DETECTED
Cocaine: NOT DETECTED
OPIATES: NOT DETECTED
Tetrahydrocannabinol: NOT DETECTED

## 2014-12-25 MED ORDER — ZIPRASIDONE MESYLATE 20 MG IM SOLR: 10.0000 mg | Freq: Three times a day (TID) | INTRAMUSCULAR | Status: DC | PRN

## 2014-12-25 MED ORDER — DOXAZOSIN MESYLATE 2 MG PO TABS
2.0000 mg | ORAL_TABLET | Freq: Every day | ORAL | Status: DC
  Administered 2014-12-26: 2 mg via ORAL
  Filled 2014-12-25 (×3): qty 1

## 2014-12-25 MED ORDER — ACETAMINOPHEN 325 MG PO TABS: 650.0000 mg | ORAL_TABLET | ORAL | Status: DC | PRN

## 2014-12-25 MED ORDER — EZETIMIBE 10 MG PO TABS
10.0000 mg | ORAL_TABLET | Freq: Every day | ORAL | Status: DC
  Administered 2014-12-25: 10 mg via ORAL
  Filled 2014-12-25 (×2): qty 1

## 2014-12-25 MED ORDER — IBUPROFEN 400 MG PO TABS: 600.0000 mg | ORAL_TABLET | Freq: Three times a day (TID) | ORAL | Status: DC | PRN

## 2014-12-25 MED ORDER — METOPROLOL SUCCINATE ER 50 MG PO TB24
50.0000 mg | ORAL_TABLET | Freq: Every day | ORAL | Status: DC
  Administered 2014-12-26: 50 mg via ORAL
  Filled 2014-12-25 (×3): qty 1

## 2014-12-25 MED ORDER — RAMIPRIL 5 MG PO CAPS
10.0000 mg | ORAL_CAPSULE | Freq: Every day | ORAL | Status: DC
  Administered 2014-12-26: 10 mg via ORAL
  Filled 2014-12-25 (×3): qty 2

## 2014-12-25 MED ORDER — ALBUTEROL SULFATE HFA 108 (90 BASE) MCG/ACT IN AERS
2.0000 | INHALATION_SPRAY | RESPIRATORY_TRACT | Status: DC | PRN
  Administered 2014-12-25 – 2014-12-26 (×2): 2 via RESPIRATORY_TRACT
  Filled 2014-12-25 (×2): qty 6.7

## 2014-12-25 MED ORDER — NICOTINE 21 MG/24HR TD PT24
21.0000 mg | MEDICATED_PATCH | Freq: Every day | TRANSDERMAL | Status: DC
  Administered 2014-12-25: 21 mg via TRANSDERMAL
  Filled 2014-12-25: qty 1

## 2014-12-25 MED ORDER — TRAZODONE HCL 50 MG PO TABS
50.0000 mg | ORAL_TABLET | Freq: Every day | ORAL | Status: DC
  Administered 2014-12-25: 50 mg via ORAL
  Filled 2014-12-25: qty 1

## 2014-12-25 MED ORDER — ONDANSETRON HCL 4 MG PO TABS: 4.0000 mg | ORAL_TABLET | Freq: Three times a day (TID) | ORAL | Status: DC | PRN

## 2014-12-25 MED ORDER — ALUM & MAG HYDROXIDE-SIMETH 200-200-20 MG/5ML PO SUSP: 30.0000 mL | ORAL | Status: DC | PRN

## 2014-12-25 MED ORDER — TIOTROPIUM BROMIDE-OLODATEROL 2.5-2.5 MCG/ACT IN AERS: 2.0000 | INHALATION_SPRAY | Freq: Every day | RESPIRATORY_TRACT | Status: DC

## 2014-12-25 MED ORDER — SIMVASTATIN 10 MG PO TABS
40.0000 mg | ORAL_TABLET | Freq: Every day | ORAL | Status: DC
  Administered 2014-12-25: 40 mg via ORAL
  Filled 2014-12-25: qty 4

## 2014-12-25 MED ORDER — WARFARIN SODIUM 5 MG PO TABS
5.0000 mg | ORAL_TABLET | Freq: Every day | ORAL | Status: AC
  Administered 2014-12-25: 5 mg via ORAL
  Filled 2014-12-25: qty 1

## 2014-12-25 MED ORDER — EZETIMIBE-SIMVASTATIN 10-40 MG PO TABS: 1.0000 | ORAL_TABLET | Freq: Every day | ORAL | Status: DC
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
## 2014-12-25 NOTE — ED Notes (Signed)
Per Catia at [facility], "They will seek In-patient treatment for Pt, and they want an EKG and a Chest xray ordered".  Nurse informed.

## 2014-12-25 NOTE — Progress Notes (Addendum)
Patient referred for psychiatric treatment at: [facility] - per intake, fax it. Referral re-faxed. [facility] - per Gerald Stabs, fax referral for review. [facility] - fax referral, per intake. [facility] - per intake, fax it. [facility] - per [place], fax referral for review. [facility] - per Pamala Hurry, fax referral, beds open. [facility] per Otila Kluver, fax referral, 1 bed open. [facility] - per Nunzio Cory, fax referral.  Declined: [facility] - per Pamala Hurry, due Dementia.  At capacity: [facility] will continue to seek placement.  Verlon Setting, [place] Disposition staff 12/25/2014 6:31 PM
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
## 2014-12-25 NOTE — ED Notes (Signed)
telepsyc completed. Family at bedside

## 2014-12-25 NOTE — ED Notes (Signed)
Dinner served. Pt's family remains with pt.

## 2014-12-26 MED ORDER — NICOTINE 21 MG/24HR TD PT24: 21.0000 mg | MEDICATED_PATCH | Freq: Every day | TRANSDERMAL | Status: DC

## 2014-12-26 NOTE — ED Notes (Signed)
Per MEagan at [facility], [place] is accepted at [facility] but has to have IVC paperwork filled out.  EDP aware.  Requested to fax papers to Meagan then she will call back when pt can be transported.

## 2014-12-26 NOTE — ED Notes (Signed)
Spoke with son, [name], updated on plan of care. States his other son will be in to visit him this morning.

## 2014-12-26 NOTE — ED Notes (Signed)
Patient eating breakfast. Took morning medication after strong encouragement and reenforcement by RN.

## 2014-12-26 NOTE — ED Notes (Signed)
Pt given coffee per request, pt becoming upset, stating that he needs to leave by 7am, advised pt that he would not be leaving yet and that breakfast had been ordered for him.

## 2014-12-26 NOTE — ED Notes (Signed)
Patient easily agitated this morning. Calling out to staff. Redirectable at this time with assurances and distraction techniques. Patient wanting staff to call his family. Informed patient could call his home around [phone].

## 2014-12-26 NOTE — Progress Notes (Signed)
Per Shirlee Limerick at [facility], pt accepted by Dr. Geanie Kenning to bed 413B- can arrive anytime. Pt under IVC. RN report # [phone].  Sharren Bridge, MSW, LCSW Clinical Social Work, Disposition  12/26/2014 [phone]

## 2014-12-26 NOTE — Progress Notes (Signed)
Shirlee Limerick at [facility] states she received pt's IVC copies and will call to accept pt when his bed is ready.   Sharren Bridge, MSW, LCSW Clinical Social Work, Disposition  12/26/2014 [phone]

## 2014-12-26 NOTE — ED Notes (Signed)
Patient's son at bedside. Updated on plan of care and new information about [facility] Acceptance. IVC paperwork in progress.

## 2014-12-26 NOTE — ED Notes (Signed)
Patient left department at this time with [facility] department. All patient belongings given to son, Pilar Plate, including patients check book and wallet.

## 2014-12-26 NOTE — ED Notes (Signed)
[facility] department here with IVC papers.

## 2014-12-26 NOTE — ED Notes (Signed)
Pt continues to call out from room, repeatedly stating that he needs to leave and that everyone will be fired for not letting him leave. Attempts to redirect pt without success.

## 2014-12-26 NOTE — ED Notes (Signed)
Patient watching TV.  Calm at present.

## 2014-12-26 NOTE — ED Provider Notes (Signed)
Patient accepted to [facility] by Dr. Ronnald Ramp.  BP 130/68 mmHg  Pulse 70  Temp(Src) 97.5 F (36.4 C) (Oral)  Resp 16  Ht 5' 9.5" (1.765 m)  Wt 195 lb (88.451 kg)  BMI 28.39 kg/m2  SpO2 92%   Ezequiel Essex, MD 12/26/14 1432

## 2015-01-06 ENCOUNTER — Ambulatory Visit: Payer: Medicare Other | Admitting: Cardiology

## 2015-01-08 ENCOUNTER — Ambulatory Visit (INDEPENDENT_AMBULATORY_CARE_PROVIDER_SITE_OTHER): Payer: Medicare Other | Admitting: Pharmacist Clinician (PhC)/ Clinical Pharmacy Specialist

## 2015-01-08 ENCOUNTER — Other Ambulatory Visit (HOSPITAL_COMMUNITY)
Admission: RE | Admit: 2015-01-08 | Discharge: 2015-01-08 | Disposition: A | Discharge status: Home / Self Care | Payer: Medicare Other | Source: Other Acute Inpatient Hospital | Attending: Family Medicine | Admitting: Family Medicine

## 2015-01-08 DIAGNOSIS — I213 ST elevation (STEMI) myocardial infarction of unspecified site: Secondary | ICD-10-CM

## 2015-01-08 DIAGNOSIS — R7989 Other specified abnormal findings of blood chemistry: Secondary | ICD-10-CM | POA: Diagnosis present

## 2015-01-08 DIAGNOSIS — Z5181 Encounter for therapeutic drug level monitoring: Secondary | ICD-10-CM

## 2015-01-08 DIAGNOSIS — I513 Intracardiac thrombosis, not elsewhere classified: Secondary | ICD-10-CM

## 2015-01-08 LAB — POCT INR: INR: 2

## 2015-01-08 LAB — BASIC METABOLIC PANEL
Anion gap: 7 (ref 5–15)
BUN: 28 mg/dL — AB (ref 6–20)
CHLORIDE: 102 mmol/L (ref 101–111)
CO2: 30 mmol/L (ref 22–32)
Calcium: 8.1 mg/dL — ABNORMAL LOW (ref 8.9–10.3)
Creatinine, Ser: 1.08 mg/dL (ref 0.61–1.24)
GFR calc Af Amer: >60 mL/min (ref >60)
GFR calc non Af Amer: >60 mL/min (ref >60)
GLUCOSE: 96 mg/dL (ref 65–99)
Potassium: 4.2 mmol/L (ref 3.5–5.1)
SODIUM: 139 mmol/L (ref 135–145)

## 2015-01-09 ENCOUNTER — Telehealth: Payer: Self-pay | Admitting: Cardiology

## 2015-01-09 DIAGNOSIS — I2510 Atherosclerotic heart disease of native coronary artery without angina pectoris: Secondary | ICD-10-CM

## 2015-01-09 NOTE — Telephone Encounter (Signed)
Jay Guzman needs samples of Vytorin 10/40

## 2015-01-09 NOTE — Telephone Encounter (Signed)
New message      Calling to tell Jay Guzman that pt was in [facility] hosp from 01-05-15 to 01-07-15.  His diagnosis was CHF, acute respiratory failure, gait instability and deconditioning

## 2015-01-09 NOTE — Telephone Encounter (Signed)
Noted in anticoag note

## 2015-01-10 ENCOUNTER — Telehealth: Payer: Self-pay | Admitting: Cardiology

## 2015-01-10 ENCOUNTER — Encounter: Payer: Self-pay | Admitting: Family Medicine

## 2015-01-10 ENCOUNTER — Ambulatory Visit (INDEPENDENT_AMBULATORY_CARE_PROVIDER_SITE_OTHER): Payer: Medicare Other | Admitting: Family Medicine

## 2015-01-10 VITALS — BP 100/50 | HR 78 | Temp 97.9°F | Resp 18 | Ht 69.0 in | Wt 172.0 lb

## 2015-01-10 DIAGNOSIS — Z09 Encounter for follow-up examination after completed treatment for conditions other than malignant neoplasm: Secondary | ICD-10-CM

## 2015-01-10 LAB — CBC WITH DIFFERENTIAL/PLATELET
BASOS ABS: 0 10*3/uL (ref 0.0–0.1)
Basophils Relative: 0 % (ref 0–1)
EOS ABS: 0.2 10*3/uL (ref 0.0–0.7)
Eosinophils Relative: 2 % (ref 0–5)
HEMATOCRIT: 49.5 % (ref 39.0–52.0)
Hemoglobin: 16.1 g/dL (ref 13.0–17.0)
LYMPHS ABS: 1.1 10*3/uL (ref 0.7–4.0)
Lymphocytes Relative: 15 % (ref 12–46)
MCH: 32.7 pg (ref 26.0–34.0)
MCHC: 32.5 g/dL (ref 30.0–36.0)
MCV: 100.6 fL — ABNORMAL HIGH (ref 78.0–100.0)
MPV: 11.3 fL (ref 8.6–12.4)
Monocytes Absolute: 0.6 10*3/uL (ref 0.1–1.0)
Monocytes Relative: 8 % (ref 3–12)
NEUTROS ABS: 5.7 10*3/uL (ref 1.7–7.7)
Neutrophils Relative %: 75 % (ref 43–77)
PLATELETS: 121 10*3/uL — AB (ref 150–400)
RBC: 4.92 MIL/uL (ref 4.22–5.81)
RDW: 14.9 % (ref 11.5–15.5)
WBC: 7.6 10*3/uL (ref 4.0–10.5)

## 2015-01-10 LAB — COMPLETE METABOLIC PANEL WITH GFR
ALBUMIN: 3.3 g/dL — AB (ref 3.6–5.1)
ALK PHOS: 47 U/L (ref 40–115)
ALT: 49 U/L — AB (ref 9–46)
AST: 33 U/L (ref 10–35)
BUN: 28 mg/dL — AB (ref 7–25)
CALCIUM: 8.3 mg/dL — AB (ref 8.6–10.3)
CO2: 36 mmol/L — ABNORMAL HIGH (ref 20–31)
CREATININE: 1.11 mg/dL (ref 0.70–1.18)
Chloride: 99 mmol/L (ref 98–110)
GFR, Est African American: 73 mL/min (ref >=60)
GFR, Est Non African American: 63 mL/min (ref >=60)
GLUCOSE: 71 mg/dL (ref 70–99)
Potassium: 4.2 mmol/L (ref 3.5–5.3)
Sodium: 142 mmol/L (ref 135–146)
TOTAL PROTEIN: 4.9 g/dL — AB (ref 6.1–8.1)
Total Bilirubin: 1.2 mg/dL (ref 0.2–1.2)

## 2015-01-10 LAB — PT WITH INR/FINGERSTICK
INR, fingerstick: 2.7 — ABNORMAL HIGH (ref 0.80–1.20)
PT FINGERSTICK: 31.9 s — AB (ref 10.4–12.5)

## 2015-01-10 MED ORDER — EZETIMIBE-SIMVASTATIN 10-40 MG PO TABS: 1.0000 | ORAL_TABLET | Freq: Every day | ORAL | Status: DC

## 2015-01-10 NOTE — Telephone Encounter (Signed)
Returned call to patient's son Tharon Aquas office out of vytorin samples.Prescription sent to pharmacy.Follow up appointment scheduled with Dr.Jordan 04/09/15 at 3:45 pm.

## 2015-01-10 NOTE — Telephone Encounter (Signed)
°  1. Which medications need to be refilled? Vytorin-please call today if possible 2. Which pharmacy is medication to be sent to?[phone]  3. Do they need a 30 day or 90 day supply? 90 and refills  4. Would they like a call back once the medication has been sent to the pharmacy? no

## 2015-01-10 NOTE — Progress Notes (Signed)
Subjective:    Patient ID: Jay Guzman, male    DOB: November 09, 1932, 81 y.o.   MRN: [id number]  HPI   Patient was finally taken to the emergency room at [facility] in early August. From there he was transferred to the geriatric psychiatry unit in [place]. Patient was delusional. I reviewed the discharge summary from [facility]. At that time, the patient was endorsing the belief that he was working for the [facility] in addition to his memory loss. He is also taking his medication wrong. He was taking both Spiriva and anoro due to his confusion. I have reviewed his hospital records, he had a COPD exacerbation while in the hospital in [place] that was treated with steroids and a 7 day course of doxycycline. He was found to have a sub-therapeutic INR for his chronic mural wall thrombosis and therefore he was treated with a Lovenox bridge until his INR reached 2.. He was discharged home on Coumadin 6 mg every day. This was due to the patient's noncompliance with his Coumadin as an outpatient.  He was placed on asenapine 10 mg pobid and aricept for his dementia and psychosis. He is here today for follow-up.  He is here today accompanied by his son.  His son called the hospital yesterday complaining that his father was urinating outside the house in public. There are again seems to be a lack of understanding of the father's condition and his family. I had a long discussion with his son today regarding the patient's dementia. I explained to the family that his dementia is not going to improve. It will likely worsen over time. He will have good days and bad days. He needs constant supervision. Asenapine is simply trying to control her agitation, delusions, and hallucinations. Unfortunately he cannot prevent them all. Patient is compliant with wearing his oxygen now. He was discharged from the hospital on stiolto.  I stressed the need for compliance with his oxygen and his medication to help prevent future  COPD exacerbations. Unfortunately the patient continues to smoke. I also explained to the patient in great detail that he is no longer safe to drive. He must refrain from driving. I have instructed the family to disable his cars and to remove any guns from the home. Past Medical History  Diagnosis Date  . Coronary disease     WITH REMOTE ANTERIOR MYOCARDIAL INFARCTION  . Mural thrombus of heart     CHRONIC  . Hypertension   . Hyperlipidemia   . CHF (congestive heart failure)   . Dementia    Past Surgical History  Procedure Laterality Date  . Cardiac catheterization  2000  . Hernia repair      STATUS POST BILATERAL  . Abdominal aortic aneurysm repair  2008   Current Outpatient Prescriptions on File Prior to Visit  Medication Sig Dispense Refill  . doxazosin (CARDURA) 2 MG tablet Take 1 tablet (2 mg total) by mouth daily. 30 tablet 6  . ezetimibe-simvastatin (VYTORIN) 10-40 MG per tablet Take 1 tablet by mouth at bedtime. 30 tablet 1  . metoprolol succinate (TOPROL-XL) 50 MG 24 hr tablet Take 1 tablet by mouth  daily 90 tablet 4  . nitroGLYCERIN (NITROSTAT) 0.4 MG SL tablet Place 1 tablet (0.4 mg total) under the tongue every 5 (five) minutes as needed. (Patient taking differently: Place 0.4 mg under the tongue every 5 (five) minutes as needed for chest pain. ) 25 tablet 6  . PROAIR HFA 108 (90 BASE) MCG/ACT inhaler  INHALE 2 PUFFS INTO THE LUNGS EVERY 6 HOURS AS NEEDED FOR WHEEZING OR SHORTNESS OF BREATH. 8.5 Inhaler 3  . Tiotropium Bromide-Olodaterol (STIOLTO RESPIMAT) 2.5-2.5 MCG/ACT AERS Inhale 2 puffs into the lungs daily. 4 g 5  . warfarin (COUMADIN) 5 MG tablet Take as directed from  coumadin clinic (Patient taking differently: Take 6 mg by mouth every evening. ) 135 tablet 4   No current facility-administered medications on file prior to visit.   Allergies  Allergen Reactions  . Codeine Other (See Comments)    Reaction during surgical  Procedure-unknown  . Morphine And Related  Other (See Comments)    Reaction unknown-during surgical procedure   Social History   Social History  . Marital Status: Married    Spouse Name: N/A  . Number of Children: 2  . 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 Smoking status: Current Every Day Smoker -- 1.00 packs/day for 60 years    Types: Cigarettes  . Smokeless tobacco: Not on file  . Alcohol Use: No  . Drug Use: No  . Sexual Activity: Not on file   Other Topics Concern  . Not on file   Social History Narrative    roReview of Systems  All other systems reviewed and are negative.      Objective:   Physical Exam  Constitutional: He appears well-developed and well-nourished.  HENT:  Right Ear: External ear normal.  Left Ear: External ear normal.  Nose: Nose normal.  Mouth/Throat: Oropharynx is clear and moist.  Eyes: Conjunctivae are normal.  Neck: Neck supple. No JVD present.  Cardiovascular: Normal rate, regular rhythm and normal heart sounds.   Pulmonary/Chest: He has decreased breath sounds. He has wheezes.  Abdominal: Soft. Bowel sounds are normal. He exhibits no distension and no mass. There is no tenderness. There is no rebound and no guarding.  Musculoskeletal: He exhibits no edema.  Lymphadenopathy:    He has no cervical adenopathy.  Neurological: He is alert.  Psychiatric: He has a normal mood and affect. His behavior is normal.  Vitals reviewed.         Assessment & Plan:  Hospital discharge follow-up - Plan: CBC with Differential/Platelet, COMPLETE METABOLIC PANEL WITH GFR, PT with INR/Fingerstick  Continue Aricept 5 mg by mouth daily. Continue asenapine to control any behavioral disturbances, less than delusion, lessen hallucinations. Patient cannot drive. All guns must be removed from the home. I recommended 24 hour a day supervision by member of the family. Recheck the patient in one month. I will check a CBC today as well as a  BMP. Also check the patient's INR today as he was unable to make it to his cardiologist earlier in the week due to a scheduling conflict. His INR today is therapeutic at 2.7 and therefore does not need to be checked again for 4 weeks. I concern recheck it at his next appointment if he has not been able to schedule one with his cardiologist. Continue Coumadin 6 mg daily

## 2015-01-13 ENCOUNTER — Encounter: Payer: Self-pay | Admitting: Cardiology

## 2015-01-13 ENCOUNTER — Telehealth: Payer: Self-pay | Admitting: Family Medicine

## 2015-01-13 ENCOUNTER — Other Ambulatory Visit: Payer: Self-pay | Admitting: *Deleted

## 2015-01-13 DIAGNOSIS — J449 Chronic obstructive pulmonary disease, unspecified: Secondary | ICD-10-CM

## 2015-01-13 DIAGNOSIS — I10 Essential (primary) hypertension: Secondary | ICD-10-CM

## 2015-01-13 DIAGNOSIS — R509 Fever, unspecified: Secondary | ICD-10-CM

## 2015-01-13 MED ORDER — DOXAZOSIN MESYLATE 2 MG PO TABS: 2.0000 mg | ORAL_TABLET | Freq: Every day | ORAL | Status: DC

## 2015-01-13 NOTE — Telephone Encounter (Signed)
Received a call from [place] at [facility] and states that the PT person states that the pt is running a low grade fever and having more confusion today - wanted to know if they could get a UA & UC. Give verbal order for ua & uc. Per WTP also wants a CXR to check his lungs since he has a fever. Called and spoke to son and he asked could it wait until tomorrow. I stated that he really needed to have done today if he is running a fever. He stated that he would have his other brother give me a call back. Order place for CXR.

## 2015-01-14 MED ORDER — CIPROFLOXACIN HCL 500 MG PO TABS: 500.0000 mg | ORAL_TABLET | Freq: Two times a day (BID) | ORAL | Status: DC

## 2015-01-14 NOTE — Telephone Encounter (Signed)
Received UA back and per WTP has UTI - WTP recommended that pt take Cipro 500mg  BID x 7 days and recheck here in 1 week. No need for CXR at this point per WTP.  Spoke to Jay Guzman and informed of UTI and treatment plan. Appt scheduled for f/u. And med sent to pharm.

## 2015-01-15 ENCOUNTER — Telehealth: Payer: Self-pay | Admitting: Family Medicine

## 2015-01-15 NOTE — Telephone Encounter (Signed)
INR was 1.6 he is on 6mg  of coumadin q evening. Miranda states that she is only going to be out there one more time next and then he will be discharged from hh if you want rechecked next week she can do it.

## 2015-01-16 DIAGNOSIS — I509 Heart failure, unspecified: Secondary | ICD-10-CM | POA: Diagnosis not present

## 2015-01-16 DIAGNOSIS — F0390 Unspecified dementia without behavioral disturbance: Secondary | ICD-10-CM | POA: Diagnosis not present

## 2015-01-16 DIAGNOSIS — E785 Hyperlipidemia, unspecified: Secondary | ICD-10-CM | POA: Diagnosis not present

## 2015-01-16 DIAGNOSIS — I10 Essential (primary) hypertension: Secondary | ICD-10-CM | POA: Diagnosis not present

## 2015-01-16 DIAGNOSIS — J449 Chronic obstructive pulmonary disease, unspecified: Secondary | ICD-10-CM | POA: Diagnosis not present

## 2015-01-16 NOTE — Telephone Encounter (Signed)
Patients sone aware of below

## 2015-01-16 NOTE — Telephone Encounter (Signed)
It was fine just last week.  This is likely due to cipro.  Increase coumadin to 7 mg a day ONLY WHILE ON CIPRO and recheck on Monday.

## 2015-01-21 ENCOUNTER — Telehealth: Payer: Self-pay | Admitting: Family Medicine

## 2015-01-21 NOTE — Telephone Encounter (Signed)
Miranda called and states that she went out and checked pt's INR yesterday and it was 4.1. She had a lot of trouble getting in touch with anyone yesterday as she left several messages and when she call it did not say anything about paging the doctor just to leave a message therefore she told the pt to not take his medication last night and she would contact us. MD please advise.

## 2015-01-21 NOTE — Telephone Encounter (Signed)
Jay Guzman called back and aware of coumadin change and made and appt for a week

## 2015-01-21 NOTE — Telephone Encounter (Signed)
Spoke to Mrs. Bhatnagar and she will have [name] call me back as neither son is home at this time.

## 2015-01-21 NOTE — Telephone Encounter (Signed)
Hold coumadin again today and tomorrow, and then begin 5 mg poqday thereafter and recheck INR in 1 week.

## 2015-01-23 ENCOUNTER — Emergency Department (HOSPITAL_COMMUNITY): Payer: Medicare Other

## 2015-01-23 ENCOUNTER — Telehealth: Payer: Self-pay | Admitting: Family Medicine

## 2015-01-23 ENCOUNTER — Ambulatory Visit: Payer: Medicare Other | Admitting: Family Medicine

## 2015-01-23 ENCOUNTER — Inpatient Hospital Stay (HOSPITAL_COMMUNITY)
Admission: EM | Admit: 2015-01-23 | Discharge: 2015-01-30 | DRG: 291 | Disposition: A | Discharge status: Skilled Nursing Facility | Payer: Medicare Other | Attending: Internal Medicine | Admitting: Internal Medicine

## 2015-01-23 ENCOUNTER — Encounter (HOSPITAL_COMMUNITY): Payer: Self-pay | Admitting: Emergency Medicine

## 2015-01-23 ENCOUNTER — Telehealth: Payer: Self-pay | Admitting: *Deleted

## 2015-01-23 DIAGNOSIS — I480 Paroxysmal atrial fibrillation: Secondary | ICD-10-CM | POA: Diagnosis present

## 2015-01-23 DIAGNOSIS — I5023 Acute on chronic systolic (congestive) heart failure: Secondary | ICD-10-CM | POA: Diagnosis not present

## 2015-01-23 DIAGNOSIS — F0390 Unspecified dementia without behavioral disturbance: Secondary | ICD-10-CM | POA: Diagnosis present

## 2015-01-23 DIAGNOSIS — J9621 Acute and chronic respiratory failure with hypoxia: Secondary | ICD-10-CM | POA: Diagnosis present

## 2015-01-23 DIAGNOSIS — Z79899 Other long term (current) drug therapy: Secondary | ICD-10-CM

## 2015-01-23 DIAGNOSIS — N179 Acute kidney failure, unspecified: Secondary | ICD-10-CM | POA: Diagnosis present

## 2015-01-23 DIAGNOSIS — Z8679 Personal history of other diseases of the circulatory system: Secondary | ICD-10-CM | POA: Diagnosis not present

## 2015-01-23 DIAGNOSIS — R4182 Altered mental status, unspecified: Secondary | ICD-10-CM | POA: Diagnosis not present

## 2015-01-23 DIAGNOSIS — I252 Old myocardial infarction: Secondary | ICD-10-CM | POA: Diagnosis not present

## 2015-01-23 DIAGNOSIS — F17210 Nicotine dependence, cigarettes, uncomplicated: Secondary | ICD-10-CM | POA: Diagnosis present

## 2015-01-23 DIAGNOSIS — N183 Chronic kidney disease, stage 3 unspecified: Secondary | ICD-10-CM

## 2015-01-23 DIAGNOSIS — I5043 Acute on chronic combined systolic (congestive) and diastolic (congestive) heart failure: Secondary | ICD-10-CM

## 2015-01-23 DIAGNOSIS — N390 Urinary tract infection, site not specified: Secondary | ICD-10-CM | POA: Diagnosis present

## 2015-01-23 DIAGNOSIS — J449 Chronic obstructive pulmonary disease, unspecified: Secondary | ICD-10-CM | POA: Diagnosis present

## 2015-01-23 DIAGNOSIS — I4891 Unspecified atrial fibrillation: Secondary | ICD-10-CM | POA: Diagnosis not present

## 2015-01-23 DIAGNOSIS — I10 Essential (primary) hypertension: Secondary | ICD-10-CM | POA: Diagnosis not present

## 2015-01-23 DIAGNOSIS — J441 Chronic obstructive pulmonary disease with (acute) exacerbation: Secondary | ICD-10-CM | POA: Diagnosis present

## 2015-01-23 DIAGNOSIS — I4819 Other persistent atrial fibrillation: Secondary | ICD-10-CM

## 2015-01-23 DIAGNOSIS — I2510 Atherosclerotic heart disease of native coronary artery without angina pectoris: Secondary | ICD-10-CM | POA: Diagnosis present

## 2015-01-23 DIAGNOSIS — J189 Pneumonia, unspecified organism: Secondary | ICD-10-CM | POA: Diagnosis present

## 2015-01-23 DIAGNOSIS — F05 Delirium due to known physiological condition: Secondary | ICD-10-CM | POA: Diagnosis present

## 2015-01-23 DIAGNOSIS — Z7901 Long term (current) use of anticoagulants: Secondary | ICD-10-CM | POA: Diagnosis not present

## 2015-01-23 DIAGNOSIS — Z885 Allergy status to narcotic agent status: Secondary | ICD-10-CM

## 2015-01-23 DIAGNOSIS — I110 Hypertensive heart disease with heart failure: Secondary | ICD-10-CM | POA: Diagnosis present

## 2015-01-23 DIAGNOSIS — Z9981 Dependence on supplemental oxygen: Secondary | ICD-10-CM | POA: Diagnosis not present

## 2015-01-23 DIAGNOSIS — I959 Hypotension, unspecified: Secondary | ICD-10-CM | POA: Diagnosis present

## 2015-01-23 DIAGNOSIS — E785 Hyperlipidemia, unspecified: Secondary | ICD-10-CM | POA: Diagnosis present

## 2015-01-23 DIAGNOSIS — I129 Hypertensive chronic kidney disease with stage 1 through stage 4 chronic kidney disease, or unspecified chronic kidney disease: Secondary | ICD-10-CM | POA: Diagnosis present

## 2015-01-23 DIAGNOSIS — J9620 Acute and chronic respiratory failure, unspecified whether with hypoxia or hypercapnia: Secondary | ICD-10-CM

## 2015-01-23 DIAGNOSIS — I255 Ischemic cardiomyopathy: Secondary | ICD-10-CM | POA: Diagnosis present

## 2015-01-23 LAB — CBC
HEMATOCRIT: 44.5 % (ref 39.0–52.0)
Hemoglobin: 14.3 g/dL (ref 13.0–17.0)
MCH: 32.9 pg (ref 26.0–34.0)
MCHC: 32.1 g/dL (ref 30.0–36.0)
MCV: 102.5 fL — AB (ref 78.0–100.0)
PLATELETS: 191 10*3/uL (ref 150–400)
RBC: 4.34 MIL/uL (ref 4.22–5.81)
RDW: 15.3 % (ref 11.5–15.5)
WBC: 9.9 10*3/uL (ref 4.0–10.5)

## 2015-01-23 LAB — URINALYSIS, ROUTINE W REFLEX MICROSCOPIC
GLUCOSE, UA: NEGATIVE mg/dL
Ketones, ur: NEGATIVE mg/dL
Nitrite: NEGATIVE
PH: 5 (ref 5.0–8.0)
Protein, ur: 30 mg/dL — AB
SPECIFIC GRAVITY, URINE: 1.022 (ref 1.005–1.030)
UROBILINOGEN UA: 0.2 mg/dL (ref 0.0–1.0)

## 2015-01-23 LAB — BRAIN NATRIURETIC PEPTIDE: B Natriuretic Peptide: 1033.2 pg/mL — ABNORMAL HIGH (ref 0.0–100.0)

## 2015-01-23 LAB — BASIC METABOLIC PANEL
Anion gap: 7 (ref 5–15)
BUN: 33 mg/dL — AB (ref 6–20)
CHLORIDE: 103 mmol/L (ref 101–111)
CO2: 29 mmol/L (ref 22–32)
CREATININE: 1.41 mg/dL — AB (ref 0.61–1.24)
Calcium: 8 mg/dL — ABNORMAL LOW (ref 8.9–10.3)
GFR calc Af Amer: 53 mL/min — ABNORMAL LOW (ref >60)
GFR calc non Af Amer: 46 mL/min — ABNORMAL LOW (ref >60)
GLUCOSE: 101 mg/dL — AB (ref 65–99)
POTASSIUM: 4.4 mmol/L (ref 3.5–5.1)
Sodium: 139 mmol/L (ref 135–145)

## 2015-01-23 LAB — URINE MICROSCOPIC-ADD ON

## 2015-01-23 LAB — I-STAT TROPONIN, ED: TROPONIN I, POC: 0.05 ng/mL (ref 0.00–0.08)

## 2015-01-23 LAB — PROTIME-INR
INR: 1.36 (ref 0.00–1.49)
Prothrombin Time: 16.9 seconds — ABNORMAL HIGH (ref 11.6–15.2)

## 2015-01-23 MED ORDER — MIRTAZAPINE 30 MG PO TABS
30.0000 mg | ORAL_TABLET | Freq: Every day | ORAL | Status: DC
  Administered 2015-01-24 – 2015-01-29 (×7): 30 mg via ORAL
  Filled 2015-01-23 (×2): qty 1
  Filled 2015-01-23: qty 2
  Filled 2015-01-23 (×2): qty 1
  Filled 2015-01-23 (×4): qty 2
  Filled 2015-01-23 (×2): qty 1
  Filled 2015-01-23: qty 2
  Filled 2015-01-23 (×2): qty 1
  Filled 2015-01-23: qty 2

## 2015-01-23 MED ORDER — FUROSEMIDE 10 MG/ML IJ SOLN
40.0000 mg | Freq: Two times a day (BID) | INTRAMUSCULAR | Status: DC
  Administered 2015-01-24 – 2015-01-29 (×11): 40 mg via INTRAVENOUS
  Filled 2015-01-23 (×11): qty 4

## 2015-01-23 MED ORDER — SODIUM CHLORIDE 0.9 % IV SOLN: 250.0000 mL | INTRAVENOUS | Status: DC | PRN

## 2015-01-23 MED ORDER — ALBUTEROL SULFATE HFA 108 (90 BASE) MCG/ACT IN AERS: 2.0000 | INHALATION_SPRAY | RESPIRATORY_TRACT | Status: DC | PRN

## 2015-01-23 MED ORDER — VANCOMYCIN HCL 10 G IV SOLR
1250.0000 mg | Freq: Once | INTRAVENOUS | Status: DC
  Filled 2015-01-23: qty 1250

## 2015-01-23 MED ORDER — WARFARIN SODIUM 10 MG PO TABS
10.0000 mg | ORAL_TABLET | ORAL | Status: AC
  Administered 2015-01-24: 10 mg via ORAL
  Filled 2015-01-23: qty 1

## 2015-01-23 MED ORDER — PIPERACILLIN-TAZOBACTAM 3.375 G IVPB 30 MIN
3.3750 g | Freq: Once | INTRAVENOUS | Status: DC
  Administered 2015-01-23: 3.375 g via INTRAVENOUS
  Filled 2015-01-23: qty 50

## 2015-01-23 MED ORDER — WARFARIN - PHARMACIST DOSING INPATIENT
Freq: Every day | Status: DC
  Administered 2015-01-27: 18:00:00

## 2015-01-23 MED ORDER — ONDANSETRON HCL 4 MG/2ML IJ SOLN: 4.0000 mg | Freq: Four times a day (QID) | INTRAMUSCULAR | Status: DC | PRN

## 2015-01-23 MED ORDER — DOXAZOSIN MESYLATE 2 MG PO TABS
2.0000 mg | ORAL_TABLET | Freq: Every day | ORAL | Status: DC
  Administered 2015-01-25 – 2015-01-30 (×6): 2 mg via ORAL
  Filled 2015-01-23 (×8): qty 1

## 2015-01-23 MED ORDER — FUROSEMIDE 10 MG/ML IJ SOLN
40.0000 mg | INTRAMUSCULAR | Status: AC
  Administered 2015-01-23: 40 mg via INTRAVENOUS
  Filled 2015-01-23: qty 4

## 2015-01-23 MED ORDER — METOPROLOL SUCCINATE ER 50 MG PO TB24
50.0000 mg | ORAL_TABLET | Freq: Every day | ORAL | Status: DC
  Administered 2015-01-25 – 2015-01-30 (×6): 50 mg via ORAL
  Filled 2015-01-23 (×8): qty 1

## 2015-01-23 MED ORDER — CEFTRIAXONE SODIUM 1 G IJ SOLR
1.0000 g | INTRAMUSCULAR | Status: DC
  Administered 2015-01-23 – 2015-01-27 (×5): 1 g via INTRAVENOUS
  Filled 2015-01-23 (×7): qty 10

## 2015-01-23 MED ORDER — ONDANSETRON HCL 4 MG PO TABS: 4.0000 mg | ORAL_TABLET | Freq: Three times a day (TID) | ORAL | Status: DC | PRN

## 2015-01-23 MED ORDER — TIOTROPIUM BROMIDE-OLODATEROL 2.5-2.5 MCG/ACT IN AERS: 2.0000 | INHALATION_SPRAY | Freq: Every day | RESPIRATORY_TRACT | Status: DC

## 2015-01-23 MED ORDER — TRAZODONE HCL 50 MG PO TABS
50.0000 mg | ORAL_TABLET | Freq: Every day | ORAL | Status: DC
  Administered 2015-01-24 – 2015-01-29 (×7): 50 mg via ORAL
  Filled 2015-01-23 (×7): qty 1

## 2015-01-23 MED ORDER — EZETIMIBE-SIMVASTATIN 10-40 MG PO TABS
1.0000 | ORAL_TABLET | Freq: Every day | ORAL | Status: DC
  Administered 2015-01-24 – 2015-01-29 (×7): 1 via ORAL
  Filled 2015-01-23 (×9): qty 1

## 2015-01-23 MED ORDER — SODIUM CHLORIDE 0.9 % IJ SOLN: 3.0000 mL | INTRAMUSCULAR | Status: DC | PRN

## 2015-01-23 MED ORDER — ACETAMINOPHEN 325 MG PO TABS: 650.0000 mg | ORAL_TABLET | ORAL | Status: DC | PRN

## 2015-01-23 MED ORDER — DONEPEZIL HCL 5 MG PO TABS
5.0000 mg | ORAL_TABLET | Freq: Every day | ORAL | Status: DC
  Administered 2015-01-24 – 2015-01-29 (×7): 5 mg via ORAL
  Filled 2015-01-23 (×7): qty 1

## 2015-01-23 MED ORDER — LISINOPRIL 2.5 MG PO TABS
2.5000 mg | ORAL_TABLET | Freq: Every day | ORAL | Status: DC
  Filled 2015-01-23: qty 1

## 2015-01-23 MED ORDER — SODIUM CHLORIDE 0.9 % IJ SOLN
3.0000 mL | Freq: Two times a day (BID) | INTRAMUSCULAR | Status: DC
Start: 2015-01-23 — End: 2015-01-25
  Administered 2015-01-24 – 2015-01-25 (×4): 3 mL via INTRAVENOUS

## 2015-01-23 MED ORDER — ASENAPINE MALEATE 5 MG SL SUBL
10.0000 mg | SUBLINGUAL_TABLET | Freq: Two times a day (BID) | SUBLINGUAL | Status: DC
  Administered 2015-01-24 – 2015-01-30 (×13): 10 mg via SUBLINGUAL
  Filled 2015-01-23 (×16): qty 2

## 2015-01-23 NOTE — Progress Notes (Signed)
ANTICOAGULATION CONSULT NOTE - Initial Consult  Pharmacy Consult for Coumadin Indication: atrial fibrillation and mural thrombus  Allergies  Allergen Reactions  . Codeine Other (See Comments)    Reaction during surgical  Procedure-unknown  . Morphine And Related Other (See Comments)    Reaction unknown-during surgical procedure     Vital Signs: Temp: 98.2 F (36.8 C) (09/08 2113) Temp Source: Rectal (09/08 2113) BP: 119/83 mmHg (09/08 2300) Pulse Rate: 108 (09/08 2300)  Labs:  Recent Labs  01/23/15 1943 01/23/15 2110  HGB 14.3  --   HCT 44.5  --   PLT 191  --   LABPROT  --  16.9*  INR  --  1.36  CREATININE 1.41*  --     Estimated Creatinine Clearance: 42.5 mL/min (by C-G formula based on Cr of 1.41).   Medical History: Past Medical History  Diagnosis Date  . Coronary disease     WITH REMOTE ANTERIOR MYOCARDIAL INFARCTION  . Mural thrombus of heart     CHRONIC  . Hypertension   . Hyperlipidemia   . CHF (congestive heart failure)   . Dementia      Assessment: 81yo male presents w/ worsening confusion and 10-pound weight gain over past 3d, admitted for acute-on-chronic CHF, to continue Coumadin for mural thrombus and now w/ new onset of Afib; current INR below goal w/ last dose of Coumadin taken 9/7.  Goal of Therapy:  INR 2-3   Plan:  Will give boosted Coumadin dose of 10mg  x1 now and monitor INR for dose adjustments.  Wynona Neat, PharmD, BCPS  01/23/2015,11:34 PM

## 2015-01-23 NOTE — ED Notes (Signed)
Patient here with complaint of possible urinary tract infection with confusion. Also family reports 10lb weight gain in recent days. History of CHF. Patient denies chest pain, shortness of breath. Chronically on 2L via [place].

## 2015-01-23 NOTE — Telephone Encounter (Signed)
Ntbs.  I am worried he may have uncompensated chf.

## 2015-01-23 NOTE — Telephone Encounter (Signed)
Spoke to [place] and they are taking him to the ER

## 2015-01-23 NOTE — Telephone Encounter (Signed)
[facility] calling to say that patient has gained 10 pounds since Monday, would like dr pickard to know about this  [phone] if any questions for the family

## 2015-01-23 NOTE — ED Notes (Signed)
Hospitalist at bedside 

## 2015-01-23 NOTE — Telephone Encounter (Signed)
Spoke to pt's son and they are taking him to the er

## 2015-01-23 NOTE — H&P (Signed)
[facility] History and Physical  Jay Guzman YKD:[id number] DOB: 1932-11-01 DOA: 01/23/2015  Referring physician: EDP PCP: Odette Fraction, MD   Chief Complaint: Confusion   HPI: Jay Guzman is a 81 y.o. male with h/o dementia, presents to ED with son with worsening confusion over the past couple of days.  He also has h/o systolic CHF, 10 lb weight gain over the past 3 days as well per his son (using same scale at home).  No fevers, no cough, no SOB worse than baseline (on home O2 at baseline).  Review of Systems: Systems reviewed.  As above, otherwise negative  Past Medical History  Diagnosis Date  . Coronary disease     WITH REMOTE ANTERIOR MYOCARDIAL INFARCTION  . Mural thrombus of heart     CHRONIC  . Hypertension   . Hyperlipidemia   . CHF (congestive heart failure)   . Dementia    Past Surgical History  Procedure Laterality Date  . Cardiac catheterization  2000  . Hernia repair      STATUS POST BILATERAL  . Abdominal aortic aneurysm repair  2008   Social History:  reports that he has been smoking Cigarettes.  He has a 60 pack-year smoking history. He does not have any smokeless tobacco history on file. He reports that he does not drink alcohol or use illicit drugs.  Allergies  Allergen Reactions  . Codeine Other (See Comments)    Reaction during surgical  Procedure-unknown  . Morphine And Related Other (See Comments)    Reaction unknown-during surgical procedure    Family History  Problem Relation Age of Onset  . Cerebral aneurysm Father   . Cancer Sister   . Cancer Mother      Prior to Admission medications   Medication Sig Start Date End Date Taking? Authorizing Provider  Asenapine Maleate 10 MG SUBL Place 10 mg under the tongue 2 (two) times daily.   Yes Historical Provider, MD  donepezil (ARICEPT) 5 MG tablet Take 5 mg by mouth at bedtime.   Yes Historical Provider, MD  doxazosin (CARDURA) 2 MG tablet Take 1 tablet (2 mg total) by  mouth daily. 01/13/15  Yes Peter M Martinique, MD  ezetimibe-simvastatin (VYTORIN) 10-40 MG per tablet Take 1 tablet by mouth at bedtime. 01/10/15  Yes Peter M Martinique, MD  furosemide (LASIX) 20 MG tablet Take 20 mg by mouth daily.    Yes Historical Provider, MD  lisinopril (PRINIVIL,ZESTRIL) 2.5 MG tablet Take 2.5 mg by mouth daily.   Yes Historical Provider, MD  metoprolol succinate (TOPROL-XL) 50 MG 24 hr tablet Take 1 tablet by mouth  daily 07/01/14  Yes Lendon Colonel, NP  mirtazapine (REMERON) 30 MG tablet Take 30 mg by mouth at bedtime.   Yes Historical Provider, MD  nicotine (NICODERM CQ - DOSED IN MG/24 HOURS) 21 mg/24hr patch Place 21 mg onto the skin daily.   Yes Historical Provider, MD  nitroGLYCERIN (NITROSTAT) 0.4 MG SL tablet Place 1 tablet (0.4 mg total) under the tongue every 5 (five) minutes as needed. Patient taking differently: Place 0.4 mg under the tongue every 5 (five) minutes as needed for chest pain.  09/04/13  Yes Peter M Martinique, MD  ondansetron (ZOFRAN) 4 MG tablet Take 4 mg by mouth every 8 (eight) hours as needed for nausea or vomiting.   Yes Historical Provider, MD  PROAIR HFA 108 (90 BASE) MCG/ACT inhaler INHALE 2 PUFFS INTO THE LUNGS EVERY 6 HOURS AS NEEDED FOR WHEEZING OR  SHORTNESS OF BREATH. 11/19/14  Yes Susy Frizzle, MD  Tiotropium Bromide-Olodaterol (STIOLTO RESPIMAT) 2.5-2.5 MCG/ACT AERS Inhale 2 puffs into the lungs daily. 10/01/14  Yes Susy Frizzle, MD  traZODone (DESYREL) 50 MG tablet Take 50 mg by mouth at bedtime.   Yes Historical Provider, MD  warfarin (COUMADIN) 5 MG tablet Take as directed from  coumadin clinic Patient taking differently: Take 6 mg by mouth every evening.  08/31/14  Yes Charlynne Cousins, MD   Physical Exam: Filed Vitals:   01/23/15 2300  BP: 119/83  Pulse: 108  Temp:   Resp: 19    BP 119/83 mmHg  Pulse 108  Temp(Src) 98.2 F (36.8 C) (Rectal)  Resp 19  SpO2 98%  General Appearance:    Alert, oriented, no distress,  appears stated age  Head:    Normocephalic, atraumatic  Eyes:    PERRL, EOMI, sclera non-icteric        Nose:   Nares without drainage or epistaxis. Mucosa, turbinates normal  Throat:   Moist mucous membranes. Oropharynx without erythema or exudate.  Neck:   Supple. No carotid bruits.  No thyromegaly.  No lymphadenopathy.   Back:     No CVA tenderness, no spinal tenderness  Lungs:     Clear to auscultation bilaterally, without wheezes, rhonchi or rales  Chest wall:    No tenderness to palpitation  Heart:    Regular rate and rhythm without murmurs, gallops, rubs  Abdomen:     Soft, non-tender, nondistended, normal bowel sounds, no organomegaly  Genitalia:    deferred  Rectal:    deferred  Extremities:   No clubbing, cyanosis or edema.  Pulses:   2+ and symmetric all extremities  Skin:   Skin color, texture, turgor normal, no rashes or lesions  Lymph nodes:   Cervical, supraclavicular, and axillary nodes normal  Neurologic:   CNII-XII intact. Normal strength, sensation and reflexes      throughout    Labs on Admission:  Basic Metabolic Panel:  Recent Labs Lab 01/23/15 1943  NA 139  K 4.4  CL 103  CO2 29  GLUCOSE 101*  BUN 33*  CREATININE 1.41*  CALCIUM 8.0*   Liver Function Tests: No results for input(s): AST, ALT, ALKPHOS, BILITOT, PROT, ALBUMIN in the last 168 hours. No results for input(s): LIPASE, AMYLASE in the last 168 hours. No results for input(s): AMMONIA in the last 168 hours. CBC:  Recent Labs Lab 01/23/15 1943  WBC 9.9  HGB 14.3  HCT 44.5  MCV 102.5*  PLT 191   Cardiac Enzymes: No results for input(s): CKTOTAL, CKMB, CKMBINDEX, TROPONINI in the last 168 hours.  BNP (last 3 results) No results for input(s): PROBNP in the last 8760 hours. CBG: No results for input(s): GLUCAP in the last 168 hours.  Radiological Exams on Admission: Dg Chest 2 View  01/23/2015   CLINICAL DATA:  Possibly urinary tract infection with confusion  EXAM: CHEST  2 VIEW   COMPARISON:  12/25/2014  FINDINGS: COPD with cardiac enlargement. These findings are stable. Vascular pattern normal. Bilateral lower lobe interstitial infiltrates new from prior study. Small bilateral effusions.  IMPRESSION: Bilateral lower lobe interstitial infiltrates right greater than left concerning for pneumonia.Mild pulmonary edema considered less likely.   Electronically Signed   By: Skipper Cliche M.D.   On: 01/23/2015 19:20    EKG: Independently reviewed.  Assessment/Plan Principal Problem:   Acute on chronic systolic CHF (congestive heart failure), NYHA class 2 Active Problems:  Essential hypertension   Dementia   UTI (lower urinary tract infection)   New onset a-fib   1. Acute on chronic systolic CHF - clinically do not think patient has HCAP at this point: 0/4 SIRS criteria, satting in upper 90s on room air, weight gain history more c/w CHF etc. 1. Getting lasix in ED 2. HF pathway 3. Lasix 40mg  IV BID ordered for inpatient diuresis 4. Patient is up 10 lbs of fluid weight over past 3 days. 5. Monitor daily BMP for renal function 2. HTN - continue home meds 3. Dementia - continue home meds 4. UTI - mild UTI, will treat with rocephin, cultures pending 5. New onset A.Fib - 1. Already on coumadin 2. Rate control 3. Tele monitor    Code Status: Full  Family Communication: Son at bedside Disposition Plan: Admit to inpatient   Time spent: 69 min  Linard Daft M. [facility] Pager [phone]  If 7AM-7PM, please contact the day team taking care of the patient [email or web address] Password [email or web address] 01/23/2015, 11:25 PM

## 2015-01-23 NOTE — Progress Notes (Signed)
[facility] received phone call from [facility] reporting patient's son was concerned about patient not having oxygen at home.  EDRN reports patient's son drove him to the hospital with patient's oxygen tank that doesn't look like will have enough oxygen to go home with.  EDCM asked EDRN to ask patient if he has a concentrator at home and if he has any more portable tanks at home.  If he has more portable tanks, ask son to drive home and bring portable tank to ED.

## 2015-01-23 NOTE — Telephone Encounter (Signed)
Received call from patient son, Tharon Aquas.   Reports that patient completed ABTx today, but has gained around 10lbs in the last (3) days, and has been very confused and talking "off the wall". states that patient has also been running a low grade fever (99).   Advised that patient needs to be seen either in UC or ED for AMS and fluid retention since no appointments available at this time with MD for today.   MD to be made aware.

## 2015-01-23 NOTE — ED Provider Notes (Signed)
CSN: [id number]     Arrival date & time 01/23/15  1732 History   First MD Initiated Contact with Patient 01/23/15 2008     Chief Complaint  Patient presents with  . Weight Gain  . Altered Mental Status     (Consider location/radiation/quality/duration/timing/severity/associated sxs/prior Treatment) HPI Comments: Patient is a 81 year old male with a past medical history of CAD, chronic systolic CHF, COPD, and hypertension who presents with a 2 day history of confusion according to the patient's son. Symptoms started gradually and remained constant since the onset. No aggravating/alleviating factors. Patient reports associated 10 pound weight gain in the past 2 days and is concerned because he has a history of CHF. No fever, cough, chest pain, abdominal pain. Patient finished a course of Cipro 2 days ago for a recent UTI. He was also diagnosed with dementia about 1 month and has been "doing well with his dementia" according to the son. Patient recently spent 3 days in a behavioral health facility from 8/23-8/26 for paranoia and aggressive behavior. No other associated symptoms.    Past Medical History  Diagnosis Date  . Coronary disease     WITH REMOTE ANTERIOR MYOCARDIAL INFARCTION  . Mural thrombus of heart     CHRONIC  . Hypertension   . Hyperlipidemia   . CHF (congestive heart failure)   . Dementia    Past Surgical History  Procedure Laterality Date  . Cardiac catheterization  2000  . Hernia repair      STATUS POST BILATERAL  . Abdominal aortic aneurysm repair  2008   Family History  Problem Relation Age of Onset  . Cerebral aneurysm Father   . Cancer Sister   . Cancer Mother    Social History  Substance Use Topics  . Smoking status: Current Every Day Smoker -- 1.00 packs/day for 60 years    Types: Cigarettes  . Smokeless tobacco: None  . Alcohol Use: No    Review of Systems  Constitutional: Positive for unexpected weight change.  Psychiatric/Behavioral: Positive for  confusion.  All other systems reviewed and are negative.     Allergies  Codeine and Morphine and related  Home Medications   Prior to Admission medications   Medication Sig Start Date End Date Taking? Authorizing Provider  Asenapine Maleate 10 MG SUBL Place 10 mg under the tongue 2 (two) times daily.    Historical Provider, MD  ciprofloxacin (CIPRO) 500 MG tablet Take 1 tablet (500 mg total) by mouth 2 (two) times daily. X 7 days 01/14/15   Susy Frizzle, MD  donepezil (ARICEPT) 5 MG tablet Take 5 mg by mouth at bedtime.    Historical Provider, MD  doxazosin (CARDURA) 2 MG tablet Take 1 tablet (2 mg total) by mouth daily. 01/13/15   Peter M Martinique, MD  ezetimibe-simvastatin (VYTORIN) 10-40 MG per tablet Take 1 tablet by mouth at bedtime. 01/10/15   Peter M Martinique, MD  furosemide (LASIX) 20 MG tablet Take 20 mg by mouth.    Historical Provider, MD  lisinopril (PRINIVIL,ZESTRIL) 2.5 MG tablet Take 2.5 mg by mouth daily.    Historical Provider, MD  metoprolol succinate (TOPROL-XL) 50 MG 24 hr tablet Take 1 tablet by mouth  daily 07/01/14   Lendon Colonel, NP  mirtazapine (REMERON) 30 MG tablet Take 30 mg by mouth at bedtime.    Historical Provider, MD  nicotine (NICODERM CQ - DOSED IN MG/24 HOURS) 21 mg/24hr patch Place 21 mg onto the skin daily.  Historical Provider, MD  nitroGLYCERIN (NITROSTAT) 0.4 MG SL tablet Place 1 tablet (0.4 mg total) under the tongue every 5 (five) minutes as needed. Patient taking differently: Place 0.4 mg under the tongue every 5 (five) minutes as needed for chest pain.  09/04/13   Peter M Martinique, MD  ondansetron (ZOFRAN) 4 MG tablet Take 4 mg by mouth every 8 (eight) hours as needed for nausea or vomiting.    Historical Provider, MD  PROAIR HFA 108 (90 BASE) MCG/ACT inhaler INHALE 2 PUFFS INTO THE LUNGS EVERY 6 HOURS AS NEEDED FOR WHEEZING OR SHORTNESS OF BREATH. 11/19/14   Susy Frizzle, MD  Tiotropium Bromide-Olodaterol (STIOLTO RESPIMAT) 2.5-2.5 MCG/ACT  AERS Inhale 2 puffs into the lungs daily. 10/01/14   Susy Frizzle, MD  traZODone (DESYREL) 50 MG tablet Take 50 mg by mouth at bedtime.    Historical Provider, MD  warfarin (COUMADIN) 5 MG tablet Take as directed from  coumadin clinic Patient taking differently: Take 6 mg by mouth every evening.  08/31/14   Charlynne Cousins, MD   BP 111/69 mmHg  Pulse 96  Temp(Src) 97.4 F (36.3 C) (Oral)  Resp 28  SpO2 99% Physical Exam  Constitutional: He is oriented to person, place, and time. He appears well-developed and well-nourished. No distress.  HENT:  Head: Normocephalic and atraumatic.  Eyes: Conjunctivae and EOM are normal.  Neck: Normal range of motion.  Cardiovascular: Normal rate and regular rhythm.  Exam reveals no gallop and no friction rub.   No murmur heard. 2+ lower extremity pitting edema.   Pulmonary/Chest: Effort normal. He has no wheezes. He has no rales. He exhibits no tenderness.  Expiratory rhonchi noted in all lung fields. Bibasilar crackles noted.   Abdominal: Soft. He exhibits no distension. There is no tenderness. There is no rebound.  Musculoskeletal: Normal range of motion.  Neurological: He is alert and oriented to person, place, and time. No cranial nerve deficit. Coordination normal.  Speech is goal-oriented. Moves limbs without ataxia.   Skin: Skin is warm and dry.  Psychiatric: He has a normal mood and affect. His behavior is normal.  Nursing note and vitals reviewed.   ED Course  Procedures (including critical care time) Labs Review Labs Reviewed  BASIC METABOLIC PANEL - Abnormal; Notable for the following:    Glucose, Bld 101 (*)    BUN 33 (*)    Creatinine, Ser 1.41 (*)    Calcium 8.0 (*)    GFR calc non Af Amer 46 (*)    GFR calc Af Amer 53 (*)    All other components within normal limits  CBC - Abnormal; Notable for the following:    MCV 102.5 (*)    All other components within normal limits  BRAIN NATRIURETIC PEPTIDE - Abnormal; Notable  for the following:    B Natriuretic Peptide 1033.2 (*)    All other components within normal limits  URINALYSIS, ROUTINE W REFLEX MICROSCOPIC (NOT AT [facility]) - Abnormal; Notable for the following:    Color, Urine AMBER (*)    APPearance CLOUDY (*)    Hgb urine dipstick SMALL (*)    Bilirubin Urine SMALL (*)    Protein, ur 30 (*)    Leukocytes, UA SMALL (*)    All other components within normal limits  PROTIME-INR - Abnormal; Notable for the following:    Prothrombin Time 16.9 (*)    All other components within normal limits  URINE MICROSCOPIC-ADD ON - Abnormal; Notable for the  following:    Bacteria, UA FEW (*)    Casts HYALINE CASTS (*)    All other components within normal limits  I-STAT TROPOININ, ED    Imaging Review Dg Chest 2 View  01/23/2015   CLINICAL DATA:  Possibly urinary tract infection with confusion  EXAM: CHEST  2 VIEW  COMPARISON:  12/25/2014  FINDINGS: COPD with cardiac enlargement. These findings are stable. Vascular pattern normal. Bilateral lower lobe interstitial infiltrates new from prior study. Small bilateral effusions.  IMPRESSION: Bilateral lower lobe interstitial infiltrates right greater than left concerning for pneumonia.Mild pulmonary edema considered less likely.   Electronically Signed   By: Skipper Cliche M.D.   On: 01/23/2015 19:20   I have personally reviewed and evaluated these images and lab results as part of my medical decision-making.   EKG Interpretation   Date/Time:  Thursday January 23 2015 18:26:28 EDT Ventricular Rate:  97 PR Interval:    QRS Duration: 86 QT Interval:  342 QTC Calculation: 434 R Axis:   126 Text Interpretation:  Atrial fibrillation with premature ventricular or  aberrantly conducted complexes Anterolateral infarct , age undetermined  Abnormal ECG Confirmed by Hazle Coca [phone]) on 01/23/2015 8:36:14 PM      MDM   Final diagnoses:  HCAP (healthcare-associated pneumonia)  Acute on chronic systolic congestive heart  failure  New onset a-fib   8:31 PM Patient's labs and urinalysis pending. Chest xray shows bilateral lower lobe infiltrates.     Alvina Chou, PA-C 01/24/15 [id number]  Quintella Reichert, MD 01/25/15 [phone]

## 2015-01-24 DIAGNOSIS — I4891 Unspecified atrial fibrillation: Secondary | ICD-10-CM

## 2015-01-24 DIAGNOSIS — J441 Chronic obstructive pulmonary disease with (acute) exacerbation: Secondary | ICD-10-CM

## 2015-01-24 DIAGNOSIS — N390 Urinary tract infection, site not specified: Secondary | ICD-10-CM

## 2015-01-24 DIAGNOSIS — I10 Essential (primary) hypertension: Secondary | ICD-10-CM

## 2015-01-24 DIAGNOSIS — F0390 Unspecified dementia without behavioral disturbance: Secondary | ICD-10-CM

## 2015-01-24 DIAGNOSIS — I5023 Acute on chronic systolic (congestive) heart failure: Secondary | ICD-10-CM

## 2015-01-24 LAB — BASIC METABOLIC PANEL
Anion gap: 7 (ref 5–15)
BUN: 31 mg/dL — AB (ref 6–20)
CHLORIDE: 103 mmol/L (ref 101–111)
CO2: 32 mmol/L (ref 22–32)
Calcium: 8 mg/dL — ABNORMAL LOW (ref 8.9–10.3)
Creatinine, Ser: 1.46 mg/dL — ABNORMAL HIGH (ref 0.61–1.24)
GFR calc Af Amer: 51 mL/min — ABNORMAL LOW (ref >60)
GFR calc non Af Amer: 44 mL/min — ABNORMAL LOW (ref >60)
GLUCOSE: 93 mg/dL (ref 65–99)
POTASSIUM: 4 mmol/L (ref 3.5–5.1)
Sodium: 142 mmol/L (ref 135–145)

## 2015-01-24 LAB — PROTIME-INR
INR: 1.36 (ref 0.00–1.49)
Prothrombin Time: 16.9 seconds — ABNORMAL HIGH (ref 11.6–15.2)

## 2015-01-24 MED ORDER — NITROGLYCERIN 2 % TD OINT
0.5000 [in_us] | TOPICAL_OINTMENT | Freq: Four times a day (QID) | TRANSDERMAL | Status: DC
  Filled 2015-01-24: qty 30

## 2015-01-24 MED ORDER — TIOTROPIUM BROMIDE-OLODATEROL 2.5-2.5 MCG/ACT IN AERS
2.0000 | INHALATION_SPRAY | Freq: Every day | RESPIRATORY_TRACT | Status: DC
  Administered 2015-01-25 – 2015-01-30 (×5): 2 via RESPIRATORY_TRACT

## 2015-01-24 MED ORDER — DIGOXIN 0.25 MG/ML IJ SOLN
0.1250 mg | Freq: Once | INTRAMUSCULAR | Status: AC
  Administered 2015-01-24: 0.125 mg via INTRAVENOUS
  Filled 2015-01-24: qty 2

## 2015-01-24 MED ORDER — DIGOXIN 0.25 MG/ML IJ SOLN
0.2500 mg | Freq: Once | INTRAMUSCULAR | Status: AC
  Administered 2015-01-24: 0.25 mg via INTRAVENOUS
  Filled 2015-01-24: qty 2

## 2015-01-24 MED ORDER — ALBUTEROL SULFATE (2.5 MG/3ML) 0.083% IN NEBU
3.0000 mL | INHALATION_SOLUTION | RESPIRATORY_TRACT | Status: DC | PRN
  Administered 2015-01-24 – 2015-01-30 (×3): 3 mL via RESPIRATORY_TRACT
  Filled 2015-01-24 (×3): qty 3

## 2015-01-24 MED ORDER — METOPROLOL TARTRATE 1 MG/ML IV SOLN: 5.0000 mg | INTRAVENOUS | Status: DC | PRN

## 2015-01-24 MED ORDER — WARFARIN SODIUM 10 MG PO TABS
10.0000 mg | ORAL_TABLET | Freq: Once | ORAL | Status: AC
Start: 2015-01-24 — End: 2015-01-24
  Administered 2015-01-24: 10 mg via ORAL
  Filled 2015-01-24: qty 1

## 2015-01-24 NOTE — Consult Note (Signed)
CONSULTATION NOTE  Reason for Consult: CHF, a-fib with RVR  Requesting Physician: Dr. Candiss Norse  Cardiologist: Dr. Martinique  CC: Shortness of breath and confusion  HPI: This is a 81 y.o. male with a past medical history significant for dementia, COPD, hypertension, dyslipidemia and coronary artery disease with remote anterior MI. He had a catheterization in 2000 which showed an occluded LAD. He also has a history of apical thrombus and is on chronic or from therapy for this. He was last seen in the office by Dr. Martinique in January of this year and was generally asymptomatic. He is known to have chronic systolic congestive heart failure and moderate LV dysfunction with an EF of 45-50% with akinesis of the mid apical anteroseptal, anterior, inferior and apical myocardium on echo in April 2016. Chronic LV mural thrombus was noted and there is moderate pulmonary hypertension with an RVSP of 53 mmHg. He now presents with worsening confusion, 10 pound weight gain and shortness of breath - he is on home oxygen. He was noted in the emergency department to be in new onset atrial fibrillation, now with rapid ventricular response. There is mild UTI based on urinalysis and he is on Rocephin. Initial troponin is negative. BNP is elevated at 1033. Creatinine was at baseline of 1.08 but is now elevated to 1.45. INR is subtherapeutic at 1.36, however was 2.7 on 01/10/2015. Cardiology is asked to consult regarding congestive heart failure and management of atrial fibrillation.  PMHx:  Past Medical History  Diagnosis Date  . Coronary disease     WITH REMOTE ANTERIOR MYOCARDIAL INFARCTION  . Mural thrombus of heart     CHRONIC  . Hypertension   . Hyperlipidemia   . CHF (congestive heart failure)   . Dementia    Past Surgical History  Procedure Laterality Date  . Cardiac catheterization  2000  . Hernia repair      STATUS POST BILATERAL  . Abdominal aortic aneurysm repair  2008    FAMHx: Family  History  Problem Relation Age of Onset  . Cerebral aneurysm Father   . Cancer Sister   . Cancer Mother     SOCHx:  reports that he has been smoking Cigarettes.  He has a 60 pack-year smoking history. He does not have any smokeless tobacco history on file. He reports that he does not drink alcohol or use illicit drugs.  ALLERGIES: Allergies  Allergen Reactions  . Codeine Other (See Comments)    Reaction during surgical  Procedure-unknown  . Morphine And Related Other (See Comments)    Reaction unknown-during surgical procedure    ROS: Review of systems not obtained due to patient factors.   HOME MEDICATIONS: Prescriptions prior to admission  Medication Sig Dispense Refill Last Dose  . Asenapine Maleate 10 MG SUBL Place 10 mg under the tongue 2 (two) times daily.   01/23/2015 at Unknown time  . donepezil (ARICEPT) 5 MG tablet Take 5 mg by mouth at bedtime.   01/22/2015 at Unknown time  . doxazosin (CARDURA) 2 MG tablet Take 1 tablet (2 mg total) by mouth daily. 30 tablet 2 01/23/2015 at Unknown time  . ezetimibe-simvastatin (VYTORIN) 10-40 MG per tablet Take 1 tablet by mouth at bedtime. 30 tablet 2 01/22/2015 at Unknown time  . furosemide (LASIX) 20 MG tablet Take 20 mg by mouth daily.    01/23/2015 at Unknown time  . lisinopril (PRINIVIL,ZESTRIL) 2.5 MG tablet Take 2.5 mg by mouth daily.   01/23/2015 at Unknown time  .  metoprolol succinate (TOPROL-XL) 50 MG 24 hr tablet Take 1 tablet by mouth  daily 90 tablet 4 01/23/2015 at 0800  . mirtazapine (REMERON) 30 MG tablet Take 30 mg by mouth at bedtime.   01/22/2015 at Unknown time  . nicotine (NICODERM CQ - DOSED IN MG/24 HOURS) 21 mg/24hr patch Place 21 mg onto the skin daily.   01/23/2015 at Unknown time  . nitroGLYCERIN (NITROSTAT) 0.4 MG SL tablet Place 1 tablet (0.4 mg total) under the tongue every 5 (five) minutes as needed. (Patient taking differently: Place 0.4 mg under the tongue every 5 (five) minutes as needed for chest pain. ) 25 tablet 6  unknown  . ondansetron (ZOFRAN) 4 MG tablet Take 4 mg by mouth every 8 (eight) hours as needed for nausea or vomiting.   01/23/2015 at Unknown time  . PROAIR HFA 108 (90 BASE) MCG/ACT inhaler INHALE 2 PUFFS INTO THE LUNGS EVERY 6 HOURS AS NEEDED FOR WHEEZING OR SHORTNESS OF BREATH. 8.5 Inhaler 3 01/23/2015 at Unknown time  . Tiotropium Bromide-Olodaterol (STIOLTO RESPIMAT) 2.5-2.5 MCG/ACT AERS Inhale 2 puffs into the lungs daily. 4 g 5 01/23/2015 at Unknown time  . traZODone (DESYREL) 50 MG tablet Take 50 mg by mouth at bedtime.   01/22/2015 at Unknown time  . warfarin (COUMADIN) 5 MG tablet Take as directed from  coumadin clinic (Patient taking differently: Take 6 mg by mouth every evening. ) 135 tablet 4 01/22/2015 at Unknown time    HOSPITAL MEDICATIONS: I have reviewed the patient's current medications.  VITALS: Blood pressure 103/77, pulse 135, temperature 97.5 F (36.4 C), temperature source Oral, resp. rate 18, height _0  (1.753 m), weight 185 lb 11.2 oz (84.233 kg), SpO2 97 %.  PHYSICAL EXAM: General appearance: alert and no distress Neck: no carotid bruit and no JVD Lungs: diminished breath sounds bilaterally Heart: irregularly irregular rhythm Abdomen: soft, non-tender; bowel sounds normal; no masses,  no organomegaly Extremities: edema 1+ bilateral pitting Pulses: 2+ and symmetric Skin: Skin color, texture, turgor normal. No rashes or lesions Neurologic: Grossly normal Psych: Pleasant  LABS: Results for orders placed or performed during the hospital encounter of 01/23/15 (from the past 48 hour(s))  Basic metabolic panel     Status: Abnormal   Collection Time: 01/23/15  7:43 PM  Result Value Ref Range   Sodium 139 135 - 145 mmol/L   Potassium 4.4 3.5 - 5.1 mmol/L   Chloride 103 101 - 111 mmol/L   CO2 29 22 - 32 mmol/L   Glucose, Bld 101 (H) 65 - 99 mg/dL   BUN 33 (H) 6 - 20 mg/dL   Creatinine, Ser 1.41 (H) 0.61 - 1.24 mg/dL   Calcium 8.0 (L) 8.9 - 10.3 mg/dL   GFR calc non  Af Amer 46 (L) >60 mL/min   GFR calc Af Amer 53 (L) >60 mL/min    Comment: (NOTE) The eGFR has been calculated using the CKD EPI equation. This calculation has not been validated in all clinical situations. eGFR's persistently <60 mL/min signify possible Chronic Kidney Disease.    Anion gap 7 5 - 15  CBC     Status: Abnormal   Collection Time: 01/23/15  7:43 PM  Result Value Ref Range   WBC 9.9 4.0 - 10.5 K/uL   RBC 4.34 4.22 - 5.81 MIL/uL   Hemoglobin 14.3 13.0 - 17.0 g/dL   HCT 44.5 39.0 - 52.0 %   MCV 102.5 (H) 78.0 - 100.0 fL   MCH 32.9 26.0 -  34.0 pg   MCHC 32.1 30.0 - 36.0 g/dL   RDW 15.3 11.5 - 15.5 %   Platelets 191 150 - 400 K/uL  Brain natriuretic peptide     Status: Abnormal   Collection Time: 01/23/15  7:43 PM  Result Value Ref Range   B Natriuretic Peptide 1033.2 (H) 0.0 - 100.0 pg/mL  Protime-INR     Status: Abnormal   Collection Time: 01/23/15  9:10 PM  Result Value Ref Range   Prothrombin Time 16.9 (H) 11.6 - 15.2 seconds   INR 1.36 0.00 - 1.49  Urinalysis, Routine w reflex microscopic (not at [facility])     Status: Abnormal   Collection Time: 01/23/15  9:21 PM  Result Value Ref Range   Color, Urine AMBER (A) YELLOW    Comment: BIOCHEMICALS MAY BE AFFECTED BY COLOR   APPearance CLOUDY (A) CLEAR   Specific Gravity, Urine 1.022 1.005 - 1.030   pH 5.0 5.0 - 8.0   Glucose, UA NEGATIVE NEGATIVE mg/dL   Hgb urine dipstick SMALL (A) NEGATIVE   Bilirubin Urine SMALL (A) NEGATIVE   Ketones, ur NEGATIVE NEGATIVE mg/dL   Protein, ur 30 (A) NEGATIVE mg/dL   Urobilinogen, UA 0.2 0.0 - 1.0 mg/dL   Nitrite NEGATIVE NEGATIVE   Leukocytes, UA SMALL (A) NEGATIVE  Urine microscopic-add on     Status: Abnormal   Collection Time: 01/23/15  9:21 PM  Result Value Ref Range   WBC, UA 11-20 <3 WBC/hpf   RBC / HPF 3-6 <3 RBC/hpf   Bacteria, UA FEW (A) RARE   Casts HYALINE CASTS (A) NEGATIVE   Urine-Other MUCOUS PRESENT   I-stat troponin, ED     Status: None   Collection Time:  01/23/15  9:28 PM  Result Value Ref Range   Troponin i, poc 0.05 0.00 - 0.08 ng/mL   Comment 3            Comment: Due to the release kinetics of cTnI, a negative result within the first hours of the onset of symptoms does not rule out myocardial infarction with certainty. If myocardial infarction is still suspected, repeat the test at appropriate intervals.   Basic metabolic panel     Status: Abnormal   Collection Time: 01/24/15  5:07 AM  Result Value Ref Range   Sodium 142 135 - 145 mmol/L   Potassium 4.0 3.5 - 5.1 mmol/L   Chloride 103 101 - 111 mmol/L   CO2 32 22 - 32 mmol/L   Glucose, Bld 93 65 - 99 mg/dL   BUN 31 (H) 6 - 20 mg/dL   Creatinine, Ser 1.46 (H) 0.61 - 1.24 mg/dL   Calcium 8.0 (L) 8.9 - 10.3 mg/dL   GFR calc non Af Amer 44 (L) >60 mL/min   GFR calc Af Amer 51 (L) >60 mL/min    Comment: (NOTE) The eGFR has been calculated using the CKD EPI equation. This calculation has not been validated in all clinical situations. eGFR's persistently <60 mL/min signify possible Chronic Kidney Disease.    Anion gap 7 5 - 15  Protime-INR     Status: Abnormal   Collection Time: 01/24/15  5:07 AM  Result Value Ref Range   Prothrombin Time 16.9 (H) 11.6 - 15.2 seconds   INR 1.36 0.00 - 1.49    IMAGING: Dg Chest 2 View  01/23/2015   CLINICAL DATA:  Possibly urinary tract infection with confusion  EXAM: CHEST  2 VIEW  COMPARISON:  12/25/2014  FINDINGS: COPD with  cardiac enlargement. These findings are stable. Vascular pattern normal. Bilateral lower lobe interstitial infiltrates new from prior study. Small bilateral effusions.  IMPRESSION: Bilateral lower lobe interstitial infiltrates right greater than left concerning for pneumonia.Mild pulmonary edema considered less likely.   Electronically Signed   By: Skipper Cliche M.D.   On: 01/23/2015 19:20    HOSPITAL DIAGNOSES: Principal Problem:   Acute on chronic systolic CHF (congestive heart failure), NYHA class 2 Active  Problems:   Essential hypertension   COPD with acute exacerbation   Dementia   UTI (lower urinary tract infection)   New onset a-fib   IMPRESSION: 1. A-fib with RVR, new onset - CHADSVASC is 3 (on warfarin, but INR subtherapeutic) 2. Acute on chronic systolic congestive heart failure NYHA class IV 3. COPD with acute exacerbation 4. UTI  RECOMMENDATION: 1. Mr. Majano is in new onset A. fib with RVR. Rate control has been complicated by hypotension. I agree with using low-dose digoxin and monitoring carefully levels based on renal function. He seems to be diuresing well and has had improvement in shortness of breath. Since he has had subtherapeutic INR, he is not a candidate for acute cardioversion unless a TEE is performed. This seems to invasive to me, especially given COPD with recent exacerbation, therefore I recommend rate control and improving anticoagulation to a therapeutic range for now and once his UTI and/or COPD exacerbation is improved we could consider outpatient cardioversion after one month of therapeutic INRs. I would continue diuresis and as blood pressure allows add back low-dose beta blocker.  Thank you for the consultation. Cardiology will follow with you.  Time Spent Directly with Patient: 45 minutes  Pixie Casino, MD, [facility] Attending Cardiologist [place] 01/24/2015, 4:29 PM

## 2015-01-24 NOTE — Evaluation (Signed)
Physical Therapy Evaluation Patient Details Name: Jay Guzman MRN: [id number] DOB: Aug 27, 1932 Today's Date: 01/24/2015   History of Present Illness  81 y.o. male with h/o dementia, presents to ED with son with worsening confusion over the past couple of days. He also has h/o systolic CHF, 10 lb weight gain over the past 3 days  Clinical Impression  Pt very pleasant and discussing [place]. Pt reportedly has 24hr assist from family but none present to confirm today. Pt with Afib limiting gait distance although pt asymptomatic with rates as high as 162 with gait. Pt with decreased balance, gait, activity tolerance and cognition who will benefit from acute therapy to maximize mobility and function to decrease burden of care.     Follow Up Recommendations Home health PT;Supervision/Assistance - 24 hour    Equipment Recommendations  None recommended by PT    Recommendations for Other Services       Precautions / Restrictions Precautions Precautions: Fall Precaution Comments: watch HR      Mobility  Bed Mobility               General bed mobility comments: in chair on arrival  Transfers Overall transfer level: Needs assistance   Transfers: Sit to/from Stand Sit to Stand: Supervision         General transfer comment: cues for hand placement and safety  Ambulation/Gait Ambulation/Gait assistance: Min guard Ambulation Distance (Feet): 200 Feet Assistive device: Rolling walker (2 wheeled) Gait Pattern/deviations: Step-through pattern;Decreased stride length;Trunk flexed   Gait velocity interpretation: Below normal speed for age/gender General Gait Details: cues for posture, position in RW with 2 standing rest breaks. Pt reported no SOB, fatigue, lightheadness or CP. End of ambulation pt noted to be in Afib with HR 145-162 immediately after gait with decline to 118-130 within 30 seconds of seated rest  Stairs            Wheelchair Mobility     Modified Rankin (Stroke Patients Only)       Balance Overall balance assessment: Needs assistance   Sitting balance-Leahy Scale: Fair       Standing balance-Leahy Scale: Poor                               Pertinent Vitals/Pain Pain Assessment: No/denies pain  sats 87% on 2L with gait and 93% on 2.5L at rest    [place] expects to be discharged to:: Private residence Living Arrangements: Spouse/significant other;Children Available Help at Discharge: Family;Available 24 hours/day Type of Home: House Home Access: Stairs to enter Entrance Stairs-Rails: Psychiatric nurse of Steps: 5 Home Layout: One level Home Equipment: Walker - 2 wheels;Bedside commode;Shower seat      Prior Function Level of Independence: Independent with assistive device(s)         Comments: pt reports he does his own bathing and dressing, walks with walker and family does the homemaking. Unclear reliability given dementia     Hand Dominance        Extremity/Trunk Assessment   Upper Extremity Assessment: Generalized weakness           Lower Extremity Assessment: Generalized weakness      Cervical / Trunk Assessment: Kyphotic  Communication   Communication: No difficulties  Cognition Arousal/Alertness: Awake/alert Behavior During Therapy: WFL for tasks assessed/performed Overall Cognitive Status: No family/caregiver present to determine baseline cognitive functioning  General Comments      Exercises        Assessment/Plan    PT Assessment Patient needs continued PT services  PT Diagnosis Difficulty walking;Generalized weakness;Altered mental status   PT Problem List Decreased strength;Decreased cognition;Decreased activity tolerance;Decreased balance;Decreased mobility;Cardiopulmonary status limiting activity;Decreased knowledge of use of DME;Decreased safety awareness  PT Treatment Interventions  Gait training;Stair training;Functional mobility training;Therapeutic activities;Therapeutic exercise;Balance training;Patient/family education;DME instruction   PT Goals (Current goals can be found in the Care Plan section) Acute Rehab PT Goals Patient Stated Goal: return home PT Goal Formulation: With patient Time For Goal Achievement: 02/07/15 Potential to Achieve Goals: Fair    Frequency Min 3X/week   Barriers to discharge   pt reports sons are available 24hrs a day but unsure since no family present    Co-evaluation               End of Session Equipment Utilized During Treatment: Gait belt Activity Tolerance: Patient tolerated treatment well Patient left: in chair;with call bell/phone within reach;with chair alarm set Nurse Communication: Mobility status;Precautions         Time: 7824-2353 PT Time Calculation (min) (ACUTE ONLY): 20 min   Charges:   PT Evaluation $Initial PT Evaluation Tier I: 1 Procedure     PT G CodesMelford Aase 01/24/2015, 12:03 PM  Elwyn Reach, [place]

## 2015-01-24 NOTE — Progress Notes (Signed)
This called notified by CCMD that pt has 9beats of Vtach. Checked pt, no complaints made. Alcario Drought admitting, made aware through text paged. We will continue to monitor.

## 2015-01-24 NOTE — Progress Notes (Signed)
Jay Guzman [id number] Admission Data: 01/24/2015 2:58 AM Attending Provider: Etta Quill, DO DUK:GURKYHC,WCBJSE TOM, MD Code Status: Full  Jay Guzman is a 81 y.o. male patient admitted from ED:  -No acute distress noted.  -No complaints of shortness of breath.  -No complaints of chest pain.   Cardiac Monitoring: Box # 37 in place. Cardiac monitor yields:Afib.  Blood pressure 101/75, pulse 58, temperature 97.8 F (36.6 C), temperature source Oral, resp. rate 21, height 5\' 9"  (1.753 m), weight 84.732 kg (186 lb 12.8 oz), SpO2 95 %.   IV Fluids:  IV in place, occlusive dsg intact without redness, IV cath antecubital left, condition patent and no redness none.   Allergies:  Codeine and Morphine and related  Past Medical History:   has a past medical history of Coronary disease; Mural thrombus of heart; Hypertension; Hyperlipidemia; CHF (congestive heart failure); and Dementia.  Past Surgical History:   has past surgical history that includes Cardiac catheterization (2000); Hernia repair; and Abdominal aortic aneurysm repair (2008).  Social History:   reports that he has been smoking Cigarettes.  He has a 60 pack-year smoking history. He does not have any smokeless tobacco history on file. He reports that he does not drink alcohol or use illicit drugs.  Skin: nsi  Patient/Family orientated to room. Information packet given to patient/family. Admission inpatient armband information verified with patient/family to include name and date of birth and placed on patient arm. Side rails up x 2, fall assessment and education completed with patient/family. Patient/family able to verbalize understanding of risk associated with falls and verbalized understanding to call for assistance before getting out of bed. Call light within reach. Patient/family able to voice and demonstrate understanding of unit orientation instructions.

## 2015-01-24 NOTE — Progress Notes (Signed)
Patient Demographics:    Jay Guzman, is a 81 y.o. male, DOB - 1933-05-06, WUG:[id number]  Admit date - 01/23/2015   Admitting Physician Etta Quill, DO  Outpatient Primary MD for the patient is [facility] TOM, MD  LOS - 1   Chief Complaint  Patient presents with  . Weight Gain  . Altered Mental Status        Subjective:    Jay Guzman today has, No headache, No chest pain, No abdominal pain - No Nausea, No new weakness tingling or numbness, No Cough - improved SOB.     Assessment  & Plan :     1. Acute on chronic hypoxic respiratory failure secondary to acute on chronic systolic heart failure. Last EF 45% on echogram done in April of this ear. He has had 10 pound weight gain, has orthopnea, has been placed on IV Lasix, continue salt and fluid restriction, continue beta blocker, ACE inhibitor skipped due to ARF, we will apply Nitropaste, monitor BMP and electrolytes. Cardiology to evaluate. Continue supportive care with oxygen and nebulizer treatments as needed.   2. ARF. Baseline creatinine is close to 1, hold ACE inhibitor, likely due to CHF decompensation, continue to diurese monitor electrolytes.   3. Dementia. At risk for delirium. Continue home medications. Minimize benzodiazepine and narcotic use.   4. UTI. Continue Rocephin.   5. Essential hypertension. Continue beta blocker, add nitro paste and monitor.   6. History of mural thrombus, New onset atrial fibrillation likely paroxysmal found in the ER. [place] VASC score greater than 3. On Coumadin and beta blocker continue, cardiology to evaluate.   7. Dyslipidemia. Continue home dose statin.   8. History of COPD. At baseline. Minimal wheezing likely due to CHF. Continue supportive care with the mobilizer treatments  and oxygen and monitor.    Code Status : Full  Family Communication  : None present  Disposition Plan  : TBD  Consults  :   Cards  Procedures  :    Echo - [phone]  Mild LVH with LVEF approximately 45%, akinesis noted of the midto distal anteroseptal, anterior, and inferior walls as well asapex consistent with ischemic cardiomyopathy. History of chronicLV mural thrombus described - not well visualized with this study (could consider microbubble contrast study if further assessmentneeded). Grade 1 diastolic dysfunction. MAC with mild mitralregurgitation. Sclerotic aortic valve with mild aorticregurgitation. Mild tricuspid regurgitation with PASP 53 mmHg.  DVT Prophylaxis  :  Coumadin  Lab Results  Component Value Date   INR 1.36 01/24/2015   INR 1.36 01/23/2015   INR 2.7* 01/10/2015     Lab Results  Component Value Date   PLT 191 01/23/2015    Inpatient Medications  Scheduled Meds: . asenapine  10 mg Sublingual BID  . cefTRIAXone (ROCEPHIN)  IV  1 g Intravenous Q24H  . donepezil  5 mg Oral QHS  . doxazosin  2 mg Oral Daily  . ezetimibe-simvastatin  1 tablet Oral QHS  . furosemide  40 mg Intravenous BID  . metoprolol succinate  50 mg Oral Daily  . mirtazapine  30 mg Oral QHS  . sodium chloride  3 mL Intravenous Q12H  . Tiotropium Bromide-Olodaterol  2 puff Inhalation Daily  . traZODone  50  mg Oral QHS  . Warfarin - Pharmacist Dosing Inpatient   Does not apply q1800   Continuous Infusions:  PRN Meds:.sodium chloride, acetaminophen, albuterol, ondansetron (ZOFRAN) IV, ondansetron, sodium chloride  Antibiotics  :     Anti-infectives    Start     Dose/Rate Route Frequency Ordered Stop   01/23/15 2330  cefTRIAXone (ROCEPHIN) 1 g in dextrose 5 % 50 mL IVPB     1 g 100 mL/hr over 30 Minutes Intravenous Every 24 hours 01/23/15 2319     01/23/15 2300  vancomycin (VANCOCIN) 1,250 mg in sodium chloride 0.9 % 250 mL IVPB  Status:  Discontinued     1,250 mg 166.7  mL/hr over 90 Minutes Intravenous  Once 01/23/15 2236 01/23/15 2313   01/23/15 2245  piperacillin-tazobactam (ZOSYN) IVPB 3.375 g  Status:  Discontinued     3.375 g 100 mL/hr over 30 Minutes Intravenous  Once 01/23/15 2236 01/23/15 2313        Objective:   Filed Vitals:   01/24/15 0052 01/24/15 0332 01/24/15 0500 01/24/15 0540  BP: 101/75   129/105  Pulse: 58   56  Temp: 97.8 F (36.6 C)   98.1 F (36.7 C)  TempSrc: Oral   Oral  Resp: 21   22  Height: 5\' 9"  (1.753 m)     Weight: 84.732 kg (186 lb 12.8 oz)  84.233 kg (185 lb 11.2 oz)   SpO2: 95% 97%  94%    Wt Readings from Last 3 Encounters:  01/24/15 84.233 kg (185 lb 11.2 oz)  01/10/15 78.019 kg (172 lb)  12/25/14 88.451 kg (195 lb)     Intake/Output Summary (Last 24 hours) at 01/24/15 1103 Last data filed at 01/24/15 1049  Gross per 24 hour  Intake    680 ml  Output    550 ml  Net    130 ml     Physical Exam  Awake Alert, Oriented X 3, No new F.N deficits, Normal affect [place].AT,[name] Supple Neck,No JVD, No cervical lymphadenopathy appriciated.  Symmetrical Chest wall movement, Good air movement bilaterally, basilar rales and wheezes RRR,No Gallops,Rubs or new Murmurs, No Parasternal Heave +ve B.Sounds, Abd Soft, No tenderness, No organomegaly appriciated, No rebound - guarding or rigidity. No Cyanosis, Clubbing , 1+ edema, No new Rash or bruise       Data Review:   Micro Results No results found for this or any previous visit (from the past 240 hour(s)).  Radiology Reports Dg Chest 2 View  01/23/2015   CLINICAL DATA:  Possibly urinary tract infection with confusion  EXAM: CHEST  2 VIEW  COMPARISON:  12/25/2014  FINDINGS: COPD with cardiac enlargement. These findings are stable. Vascular pattern normal. Bilateral lower lobe interstitial infiltrates new from prior study. Small bilateral effusions.  IMPRESSION: Bilateral lower lobe interstitial infiltrates right greater than left concerning for pneumonia.Mild  pulmonary edema considered less likely.   Electronically Signed   By: Skipper Cliche M.D.   On: 01/23/2015 19:20   Dg Chest 2 View  12/25/2014   CLINICAL DATA:  Altered mental status and confusion.  EXAM: CHEST  2 VIEW  COMPARISON:  08/26/2014  FINDINGS: Mild cardiac enlargement. Aortic atherosclerosis identified. No pleural effusion or edema identified. No airspace consolidation. The lungs are hyperinflated.  IMPRESSION: 1. No acute cardiopulmonary abnormalities. 2. Aortic atherosclerosis.   Electronically Signed   By: Kerby Moors M.D.   On: 12/25/2014 16:08   Ct Head Wo Contrast  12/25/2014   CLINICAL DATA:  Altered mental status, confusion  EXAM: CT HEAD WITHOUT CONTRAST  TECHNIQUE: Contiguous axial images were obtained from the base of the skull through the vertex without intravenous contrast.  COMPARISON:  None.  FINDINGS: No skull fracture is noted. Paranasal sinuses and mastoid air cells are unremarkable.  No intracranial hemorrhage, mass effect or midline shift. Atherosclerotic calcifications of carotid siphon. Mild cerebral atrophy. Mild periventricular white matter decreased attenuation probable due to chronic small vessel ischemic changes. No acute cortical infarction. No mass lesion is noted on this unenhanced scan.  IMPRESSION: No acute intracranial abnormality. Mild cerebral atrophy. Mild periventricular and patchy subcortical white matter decreased attenuation probable due to chronic small vessel ischemic changes.   Electronically Signed   By: Lahoma Crocker M.D.   On: 12/25/2014 16:04     CBC  Recent Labs Lab 01/23/15 1943  WBC 9.9  HGB 14.3  HCT 44.5  PLT 191  MCV 102.5*  MCH 32.9  MCHC 32.1  RDW 15.3    Chemistries   Recent Labs Lab 01/23/15 1943 01/24/15 0507  NA 139 142  K 4.4 4.0  CL 103 103  CO2 29 32  GLUCOSE 101* 93  BUN 33* 31*  CREATININE 1.41* 1.46*  CALCIUM 8.0* 8.0*    ------------------------------------------------------------------------------------------------------------------ estimated creatinine clearance is 41 mL/min (by C-G formula based on Cr of 1.46). ------------------------------------------------------------------------------------------------------------------ No results for input(s): HGBA1C in the last 72 hours. ------------------------------------------------------------------------------------------------------------------ No results for input(s): CHOL, HDL, LDLCALC, TRIG, CHOLHDL, LDLDIRECT in the last 72 hours. ------------------------------------------------------------------------------------------------------------------ No results for input(s): TSH, T4TOTAL, T3FREE, THYROIDAB in the last 72 hours.  Invalid input(s): FREET3 ------------------------------------------------------------------------------------------------------------------ No results for input(s): VITAMINB12, FOLATE, FERRITIN, TIBC, IRON, RETICCTPCT in the last 72 hours.  Coagulation profile  Recent Labs Lab 01/23/15 2110 01/24/15 0507  INR 1.36 1.36    No results for input(s): DDIMER in the last 72 hours.  Cardiac Enzymes No results for input(s): CKMB, TROPONINI, MYOGLOBIN in the last 168 hours.  Invalid input(s): CK ------------------------------------------------------------------------------------------------------------------ Invalid input(s): POCBNP   Time Spent in minutes 35   SINGH,PRASHANT K M.D on 01/24/2015 at 11:03 AM  Between 7am to 7pm - Pager - [phone]  After 7pm go to [email or web address] - password [facility]  [facility] -  Office  [phone]

## 2015-01-24 NOTE — Progress Notes (Addendum)
Spoke to [facility] about respiratory medication(Stiolto) that is not carried here at hospital.  I notified patient's son [name] he stated he would have his brother bring up medication (he did mention that they were down to 1 inhaler at home he wasn't able to tell me the name of the one they have).

## 2015-01-24 NOTE — Progress Notes (Signed)
Patient is active with [facility] / PT as prior to admission; Aneta Mins [phone]

## 2015-01-24 NOTE — Progress Notes (Signed)
Received pt report from Brooke,RN-ED.  

## 2015-01-24 NOTE — Progress Notes (Signed)
ANTICOAGULATION CONSULT NOTE - Follow Up Consult  Pharmacy Consult for warfarin Indication: atrial fibrillation and hx mural thrombus  Allergies  Allergen Reactions  . Codeine Other (See Comments)    Reaction during surgical  Procedure-unknown  . Morphine And Related Other (See Comments)    Reaction unknown-during surgical procedure    Patient Measurements: Height: 5\' 9"  (175.3 cm) Weight: 185 lb 11.2 oz (84.233 kg) IBW/kg (Calculated) : 70.7 Heparin Dosing Weight:   Vital Signs: Temp: 98.1 F (36.7 C) (09/09 0540) Temp Source: Oral (09/09 0540) BP: 129/105 mmHg (09/09 0540) Pulse Rate: 135 (09/09 1157)  Labs:  Recent Labs  01/23/15 1943 01/23/15 2110 01/24/15 0507  HGB 14.3  --   --   HCT 44.5  --   --   PLT 191  --   --   LABPROT  --  16.9* 16.9*  INR  --  1.36 1.36  CREATININE 1.41*  --  1.46*    Estimated Creatinine Clearance: 41 mL/min (by C-G formula based on Cr of 1.46).   Assessment: 81 y/o male who presents with worsening confusion and 10-pound weight gain over past 3d, admitted for acute-on-chronic CHF. He takes chronic warfarin for hx mural thrombus but also has new onset Afib. INR is subtherapeutic at 1.36. No bleeding noted, CBC is normal.  PTA: 6 mg daily  Goal of Therapy:  INR 2-3 Monitor platelets by anticoagulation protocol: Yes   Plan:  - Warfarin 10 mg PO tonight - INR daily - Monitor for s/sx of bleeding  [facility], Pharm.D., BCPS Clinical Pharmacist Pager: [phone] 01/24/2015 1:00 PM

## 2015-01-24 NOTE — Progress Notes (Signed)
Patient's BP= 100/65 at 1045.  Spoke to Dr. Candiss Norse he stated to hold all am blood pressure medications.

## 2015-01-24 NOTE — Progress Notes (Signed)
Patient's BP=103/74.  Spoke to Dr. Candiss Norse he stated to discontinue nitropaste.

## 2015-01-24 NOTE — ED Notes (Signed)
Attempted report x1. 

## 2015-01-25 LAB — BASIC METABOLIC PANEL
Anion gap: 6 (ref 5–15)
BUN: 31 mg/dL — AB (ref 6–20)
CALCIUM: 8 mg/dL — AB (ref 8.9–10.3)
CO2: 34 mmol/L — ABNORMAL HIGH (ref 22–32)
CREATININE: 1.45 mg/dL — AB (ref 0.61–1.24)
Chloride: 104 mmol/L (ref 101–111)
GFR calc non Af Amer: 44 mL/min — ABNORMAL LOW (ref >60)
GFR, EST AFRICAN AMERICAN: 51 mL/min — AB (ref >60)
Glucose, Bld: 107 mg/dL — ABNORMAL HIGH (ref 65–99)
Potassium: 4.3 mmol/L (ref 3.5–5.1)
SODIUM: 144 mmol/L (ref 135–145)

## 2015-01-25 LAB — PROTIME-INR
INR: 1.66 — AB (ref 0.00–1.49)
PROTHROMBIN TIME: 19.6 s — AB (ref 11.6–15.2)

## 2015-01-25 MED ORDER — DIGOXIN 125 MCG PO TABS
0.1250 mg | ORAL_TABLET | Freq: Every day | ORAL | Status: DC
  Administered 2015-01-25 – 2015-01-26 (×2): 0.125 mg via ORAL
  Filled 2015-01-25 (×2): qty 1

## 2015-01-25 MED ORDER — WARFARIN SODIUM 3 MG PO TABS
6.0000 mg | ORAL_TABLET | Freq: Once | ORAL | Status: AC
  Administered 2015-01-25: 6 mg via ORAL
  Filled 2015-01-25: qty 2

## 2015-01-25 NOTE — Clinical Documentation Improvement (Signed)
Hospitalist  Can the diagnosis of altered mental status be further specified?   Encephalopathy - Alcoholic, Anoxic/Hypoxia, Drug Induced/Toxic (specify drug), Hepatic, Hypertensive, Hypoglycemic, Metabolic/Septic, Traumatic/post concussive, Wernicke, Other   Acute Confusion  Acute Delirium  Other  Clinically Undetermined  Document any associated diagnoses/conditions.   Supporting Information: Patient presents with AMS, recently spent 3 days in a [facility] facility for paranoia and aggressive behavior, also has a history of dementia per 9/09 progress notes.  Please exercise your independent, professional judgment when responding. A specific answer is not anticipated or expected.  Thank You,  [place] [phone]

## 2015-01-25 NOTE — Progress Notes (Signed)
ANTICOAGULATION CONSULT NOTE - Follow Up Consult  Pharmacy Consult for warfarin Indication: atrial fibrillation and hx mural thrombus  Allergies  Allergen Reactions  . Codeine Other (See Comments)    Reaction during surgical  Procedure-unknown  . Morphine And Related Other (See Comments)    Reaction unknown-during surgical procedure    Patient Measurements: Height: 5\' 9"  (175.3 cm) Weight: 184 lb (83.462 kg) IBW/kg (Calculated) : 70.7 Heparin Dosing Weight:   Vital Signs: Temp: 98.4 F (36.9 C) (09/10 0407) Temp Source: Oral (09/10 0407) BP: 120/92 mmHg (09/10 0407) Pulse Rate: 74 (09/10 0407)  Labs:  Recent Labs  01/23/15 1943 01/23/15 2110 01/24/15 0507 01/25/15 0411  HGB 14.3  --   --   --   HCT 44.5  --   --   --   PLT 191  --   --   --   LABPROT  --  16.9* 16.9* 19.6*  INR  --  1.36 1.36 1.66*  CREATININE 1.41*  --  1.46* 1.45*    Estimated Creatinine Clearance: 41.3 mL/min (by C-G formula based on Cr of 1.45).   Assessment: 81 y/o male who presents with worsening confusion and 10-pound weight gain over past 3d, admitted for acute-on-chronic CHF. He takes chronic warfarin for hx mural thrombus but also has new onset Afib. INR is subtherapeutic at 1.66. No bleeding noted, CBC is normal.  PTA: 6 mg daily  Goal of Therapy:  INR 2-3 Monitor platelets by anticoagulation protocol: Yes   Plan:  - Warfarin 6 mg PO tonight - INR daily - Monitor for s/sx of bleeding  Thank you Anette Guarneri, PharmD [phone]  01/25/2015 11:48 AM

## 2015-01-25 NOTE — Progress Notes (Signed)
Patient Demographics:    Jay Guzman, is a 81 y.o. male, DOB - 10-08-1932, BPZ:[id number]  Admit date - 01/23/2015   Admitting Physician Etta Quill, DO  Outpatient Primary MD for the patient is Odette Fraction, MD  LOS - 2   Chief Complaint  Patient presents with  . Weight Gain  . Altered Mental Status        Subjective:    Jay Guzman today has, No headache, No chest pain, No abdominal pain - No Nausea, No new weakness tingling or numbness, No Cough - improved SOB.     Assessment  & Plan :     1. Acute on chronic hypoxic respiratory failure secondary to acute on chronic systolic heart failure. Last EF 45% on echogram done in April of this ear. He has had 10 pound weight gain, has orthopnea, has been placed on IV Lasix, continue salt and fluid restriction, continue beta blocker, ACE inhibitor skipped due to ARF, we will apply Nitropaste, monitor BMP and electrolytes. Cardiology to evaluate. Continue supportive care with oxygen and nebulizer treatments as needed.  Filed Weights   01/24/15 0052 01/24/15 0500 01/25/15 0407  Weight: 84.732 kg (186 lb 12.8 oz) 84.233 kg (185 lb 11.2 oz) 83.462 kg (184 lb)     2. ARF. Baseline creatinine is close to 1, hold ACE inhibitor, likely due to CHF decompensation, continue to diurese monitor electrolytes.   3. Dementia. At risk for delirium. Continue home medications. Minimize benzodiazepine and narcotic use.   4. UTI. Continue Rocephin.   5. Essential hypertension. Continue beta blocker, add nitro paste and monitor.   6. History of mural thrombus, New onset atrial fibrillation likely paroxysmal found in the ER. [place] VASC score greater than 3. On Coumadin and beta blocker continue, had to place him on low-dose digoxin due to RVR and  borderline blood pressures, cardiology on board. Pharmacy monitoring Coumadin. Goal will be rate control. Monitor digoxin level potassium and renal function.   7. Dyslipidemia. Continue home dose statin.   8. History of COPD. At baseline. Minimal wheezing likely due to CHF. Continue supportive care with the mobilizer treatments and oxygen and monitor.    Code Status : Full  Family Communication  : None present  Disposition Plan  : TBD  Consults  :   Cards  Procedures  :    Echo - [phone]  Mild LVH with LVEF approximately 45%, akinesis noted of the midto distal anteroseptal, anterior, and inferior walls as well asapex consistent with ischemic cardiomyopathy. History of chronicLV mural thrombus described - not well visualized with this study (could consider microbubble contrast study if further assessmentneeded). Grade 1 diastolic dysfunction. MAC with mild mitralregurgitation. Sclerotic aortic valve with mild aorticregurgitation. Mild tricuspid regurgitation with PASP 53 mmHg.  DVT Prophylaxis  :  Coumadin  Lab Results  Component Value Date   INR 1.66* 01/25/2015   INR 1.36 01/24/2015   INR 1.36 01/23/2015     Lab Results  Component Value Date   PLT 191 01/23/2015    Inpatient Medications  Scheduled Meds: . asenapine  10 mg Sublingual BID  . cefTRIAXone (ROCEPHIN)  IV  1 g Intravenous Q24H  . digoxin  0.125 mg Oral Daily  . donepezil  5 mg Oral QHS  . doxazosin  2 mg Oral Daily  . ezetimibe-simvastatin  1 tablet Oral QHS  . furosemide  40 mg Intravenous BID  . metoprolol succinate  50 mg Oral Daily  . mirtazapine  30 mg Oral QHS  . Tiotropium Bromide-Olodaterol  2 puff Inhalation Daily  . traZODone  50 mg Oral QHS  . Warfarin - Pharmacist Dosing Inpatient   Does not apply q1800   Continuous Infusions:  PRN Meds:.acetaminophen, albuterol, metoprolol, ondansetron (ZOFRAN) IV  Antibiotics  :     Anti-infectives    Start     Dose/Rate Route Frequency  Ordered Stop   01/23/15 2330  cefTRIAXone (ROCEPHIN) 1 g in dextrose 5 % 50 mL IVPB     1 g 100 mL/hr over 30 Minutes Intravenous Every 24 hours 01/23/15 2319     01/23/15 2300  vancomycin (VANCOCIN) 1,250 mg in sodium chloride 0.9 % 250 mL IVPB  Status:  Discontinued     1,250 mg 166.7 mL/hr over 90 Minutes Intravenous  Once 01/23/15 2236 01/23/15 2313   01/23/15 2245  piperacillin-tazobactam (ZOSYN) IVPB 3.375 g  Status:  Discontinued     3.375 g 100 mL/hr over 30 Minutes Intravenous  Once 01/23/15 2236 01/23/15 2313        Objective:   Filed Vitals:   01/24/15 1826 01/24/15 2213 01/25/15 0407 01/25/15 0742  BP: 112/73 92/54 120/92   Pulse: 122 62 74   Temp: 97.7 F (36.5 C) 98.2 F (36.8 C) 98.4 F (36.9 C)   TempSrc: Oral Oral Oral   Resp: 20 18 18    Height:      Weight:   83.462 kg (184 lb)   SpO2: 99% 99% 98% 96%    Wt Readings from Last 3 Encounters:  01/25/15 83.462 kg (184 lb)  01/10/15 78.019 kg (172 lb)  12/25/14 88.451 kg (195 lb)     Intake/Output Summary (Last 24 hours) at 01/25/15 1123 Last data filed at 01/25/15 0824  Gross per 24 hour  Intake   1260 ml  Output   1100 ml  Net    160 ml     Physical Exam  Awake Alert, Oriented X 2, No new F.N deficits, Normal affect [name],[name] Supple Neck,No JVD, No cervical lymphadenopathy appriciated.  Symmetrical Chest wall movement, Good air movement bilaterally, basilar rales and wheezes RRR,No Gallops,Rubs or new Murmurs, No Parasternal Heave +ve B.Sounds, Abd Soft, No tenderness, No organomegaly appriciated, No rebound - guarding or rigidity. No Cyanosis, Clubbing , 1+ edema, No new Rash or bruise       Data Review:   Micro Results No results found for this or any previous visit (from the past 240 hour(s)).  Radiology Reports Dg Chest 2 View  01/23/2015   CLINICAL DATA:  Possibly urinary tract infection with confusion  EXAM: CHEST  2 VIEW  COMPARISON:  12/25/2014  FINDINGS: COPD with cardiac  enlargement. These findings are stable. Vascular pattern normal. Bilateral lower lobe interstitial infiltrates new from prior study. Small bilateral effusions.  IMPRESSION: Bilateral lower lobe interstitial infiltrates right greater than left concerning for pneumonia.Mild pulmonary edema considered less likely.   Electronically Signed   By: Skipper Cliche M.D.   On: 01/23/2015 19:20     CBC  Recent Labs Lab 01/23/15 1943  WBC 9.9  HGB 14.3  HCT 44.5  PLT 191  MCV 102.5*  MCH 32.9  MCHC 32.1  RDW 15.3    Chemistries   Recent Labs  Lab 01/23/15 1943 01/24/15 0507 01/25/15 0411  NA 139 142 144  K 4.4 4.0 4.3  CL 103 103 104  CO2 29 32 34*  GLUCOSE 101* 93 107*  BUN 33* 31* 31*  CREATININE 1.41* 1.46* 1.45*  CALCIUM 8.0* 8.0* 8.0*   ------------------------------------------------------------------------------------------------------------------ estimated creatinine clearance is 41.3 mL/min (by C-G formula based on Cr of 1.45). ------------------------------------------------------------------------------------------------------------------ No results for input(s): HGBA1C in the last 72 hours. ------------------------------------------------------------------------------------------------------------------ No results for input(s): CHOL, HDL, LDLCALC, TRIG, CHOLHDL, LDLDIRECT in the last 72 hours. ------------------------------------------------------------------------------------------------------------------ No results for input(s): TSH, T4TOTAL, T3FREE, THYROIDAB in the last 72 hours.  Invalid input(s): FREET3 ------------------------------------------------------------------------------------------------------------------ No results for input(s): VITAMINB12, FOLATE, FERRITIN, TIBC, IRON, RETICCTPCT in the last 72 hours.  Coagulation profile  Recent Labs Lab 01/23/15 2110 01/24/15 0507 01/25/15 0411  INR 1.36 1.36 1.66*    No results for input(s): DDIMER in the  last 72 hours.  Cardiac Enzymes No results for input(s): CKMB, TROPONINI, MYOGLOBIN in the last 168 hours.  Invalid input(s): CK ------------------------------------------------------------------------------------------------------------------ Invalid input(s): POCBNP   Time Spent in minutes 35   SINGH,PRASHANT K M.D on 01/25/2015 at 11:23 AM  Between 7am to 7pm - Pager - [phone]  After 7pm go to [email or web address] - password [facility]  [facility] -  Office  [phone]

## 2015-01-25 NOTE — Progress Notes (Signed)
Patient ID: JASYAH THEURER MRN: [id number] DOB/AGE: 79/30/1934 79 y.o.  Admit date: 01/23/2015 Primary Physician Odette Fraction, MD Primary Cardiologist Dr. Martinique   Patient ID: 81 y.o. male with a past medical history significant for dementia, COPD, hypertension, dyslipidemia and coronary artery disease with remote anterior MI here with new onset atrial fibrillation with RVR.  Interval history: Started digoxin.   Subjective: Sleepy.  Unable to answer questions.    Medications Prior to Admission  Medication Sig Dispense Refill  . Asenapine Maleate 10 MG SUBL Place 10 mg under the tongue 2 (two) times daily.    Marland Kitchen donepezil (ARICEPT) 5 MG tablet Take 5 mg by mouth at bedtime.    Marland Kitchen doxazosin (CARDURA) 2 MG tablet Take 1 tablet (2 mg total) by mouth daily. 30 tablet 2  . ezetimibe-simvastatin (VYTORIN) 10-40 MG per tablet Take 1 tablet by mouth at bedtime. 30 tablet 2  . furosemide (LASIX) 20 MG tablet Take 20 mg by mouth daily.     Marland Kitchen lisinopril (PRINIVIL,ZESTRIL) 2.5 MG tablet Take 2.5 mg by mouth daily.    . metoprolol succinate (TOPROL-XL) 50 MG 24 hr tablet Take 1 tablet by mouth  daily 90 tablet 4  . mirtazapine (REMERON) 30 MG tablet Take 30 mg by mouth at bedtime.    . nicotine (NICODERM CQ - DOSED IN MG/24 HOURS) 21 mg/24hr patch Place 21 mg onto the skin daily.    . nitroGLYCERIN (NITROSTAT) 0.4 MG SL tablet Place 1 tablet (0.4 mg total) under the tongue every 5 (five) minutes as needed. (Patient taking differently: Place 0.4 mg under the tongue every 5 (five) minutes as needed for chest pain. ) 25 tablet 6  . ondansetron (ZOFRAN) 4 MG tablet Take 4 mg by mouth every 8 (eight) hours as needed for nausea or vomiting.    Marland Kitchen PROAIR HFA 108 (90 BASE) MCG/ACT inhaler INHALE 2 PUFFS INTO THE LUNGS EVERY 6 HOURS AS NEEDED FOR WHEEZING OR SHORTNESS OF BREATH. 8.5 Inhaler 3  . Tiotropium Bromide-Olodaterol (STIOLTO RESPIMAT) 2.5-2.5 MCG/ACT AERS Inhale 2 puffs into the lungs  daily. 4 g 5  . traZODone (DESYREL) 50 MG tablet Take 50 mg by mouth at bedtime.    Marland Kitchen warfarin (COUMADIN) 5 MG tablet Take as directed from  coumadin clinic (Patient taking differently: Take 6 mg by mouth every evening. ) 135 tablet 4     . asenapine  10 mg Sublingual BID  . cefTRIAXone (ROCEPHIN)  IV  1 g Intravenous Q24H  . digoxin  0.125 mg Oral Daily  . donepezil  5 mg Oral QHS  . doxazosin  2 mg Oral Daily  . ezetimibe-simvastatin  1 tablet Oral QHS  . furosemide  40 mg Intravenous BID  . metoprolol succinate  50 mg Oral Daily  . mirtazapine  30 mg Oral QHS  . Tiotropium Bromide-Olodaterol  2 puff Inhalation Daily  . traZODone  50 mg Oral QHS  . warfarin  6 mg Oral ONCE-1800  . Warfarin - Pharmacist Dosing Inpatient   Does not apply q1800    Infusions:    Allergies  Allergen Reactions  . Codeine Other (See Comments)    Reaction during surgical  Procedure-unknown  . Morphine And Related Other (See Comments)    Reaction unknown-during surgical procedure    Social History   Social History  . Marital Status: Married    Spouse Name: N/A  . Number of Children: 2  . 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 Smoking status: Current Every Day Smoker -- 1.00 packs/day for 60 years    Types: Cigarettes  . Smokeless tobacco: Not on file  . Alcohol Use: No  . Drug Use: No  . Sexual Activity: Not on file   Other Topics Concern  . Not on file   Social History Narrative    Family History  Problem Relation Age of Onset  . Cerebral aneurysm Father   . Cancer Sister   . Cancer Mother     PHYSICAL EXAM: Filed Vitals:   01/25/15 1300  BP: 126/90  Pulse: 72  Temp: 98 F (36.7 C)  Resp: 18     Intake/Output Summary (Last 24 hours) at 01/25/15 1509 Last data filed at 01/25/15 1300  Gross per 24 hour  Intake   1280 ml  Output   1100 ml  Net    180 ml    General:  Well appearing. No respiratory  difficulty HEENT: normal Neck: supple. no JVD. Carotids 2+ bilat; no bruits. No lymphadenopathy or thryomegaly appreciated. Cor: PMI nondisplaced. Irregularly irregular. No rubs, gallops or murmurs. Lungs: clear Abdomen: soft, nontender, nondistended. No hepatosplenomegaly. No bruits or masses. Good bowel sounds. Extremities: no cyanosis, clubbing, rash, 1+ pitting edema bilaterally Neuro: alert & oriented x 3, cranial nerves grossly intact. moves all 4 extremities w/o difficulty. Affect pleasant.  Results for orders placed or performed during the hospital encounter of 01/23/15 (from the past 24 hour(s))  Basic metabolic panel     Status: Abnormal   Collection Time: 01/25/15  4:11 AM  Result Value Ref Range   Sodium 144 135 - 145 mmol/L   Potassium 4.3 3.5 - 5.1 mmol/L   Chloride 104 101 - 111 mmol/L   CO2 34 (H) 22 - 32 mmol/L   Glucose, Bld 107 (H) 65 - 99 mg/dL   BUN 31 (H) 6 - 20 mg/dL   Creatinine, Ser 1.45 (H) 0.61 - 1.24 mg/dL   Calcium 8.0 (L) 8.9 - 10.3 mg/dL   GFR calc non Af Amer 44 (L) >60 mL/min   GFR calc Af Amer 51 (L) >60 mL/min   Anion gap 6 5 - 15  Protime-INR     Status: Abnormal   Collection Time: 01/25/15  4:11 AM  Result Value Ref Range   Prothrombin Time 19.6 (H) 11.6 - 15.2 seconds   INR 1.66 (H) 0.00 - 1.49   Dg Chest 2 View  01/23/2015   CLINICAL DATA:  Possibly urinary tract infection with confusion  EXAM: CHEST  2 VIEW  COMPARISON:  12/25/2014  FINDINGS: COPD with cardiac enlargement. These findings are stable. Vascular pattern normal. Bilateral lower lobe interstitial infiltrates new from prior study. Small bilateral effusions.  IMPRESSION: Bilateral lower lobe interstitial infiltrates right greater than left concerning for pneumonia.Mild pulmonary edema considered less likely.   Electronically Signed   By: Skipper Cliche M.D.   On: 01/23/2015 19:20    ECG: atrial fibrillation.  Rate 97 bpm.  Prior anterolateral infarct.  Telemetry:  Atrial  fibrillation, rates up to 140s.  Trending down with most recent rates in the 80s.  Occasional PVCs.   ASSESSMENT/PLAN: #  Atrial fibrillation with RVR: Rates better controlled on digoxin.   - Continue digoxin and follow up level tomorrow as ordered - Warfarin per pharmacy consult - Consider TEE/DCCV as an outpatient if he remains SOB after treatment for COPD exacerbation  # Acute  on chronic systolic heart failure.  NYHA class IV.  Mildly volume overloaded. - Continue lasxi 40mg  IV bid - Continue digoxin as above - Metoprolol succinate 50mg  daily  # COPD/UTI: Infection management per primary team    Signed: Skeet Latch P 01/25/2015, 3:09 PM

## 2015-01-25 NOTE — Progress Notes (Signed)
Physical Therapy Note  Noted order for PT Consult for imminent discharge. Patient was evaluated 9/9 with recommendation for HHPT and no DME needs. Spoke with RN and she was not aware of any change since that time that warranted PT re-eval. Text paged MD to make him aware of above and to notify this PT if further assessment by PT needed prior to discharge.    01/25/2015 Barry Brunner, PT Pager: [phone]

## 2015-01-26 LAB — BASIC METABOLIC PANEL
Anion gap: 6 (ref 5–15)
BUN: 28 mg/dL — AB (ref 6–20)
CALCIUM: 8.4 mg/dL — AB (ref 8.9–10.3)
CHLORIDE: 103 mmol/L (ref 101–111)
CO2: 35 mmol/L — ABNORMAL HIGH (ref 22–32)
CREATININE: 1.24 mg/dL (ref 0.61–1.24)
GFR calc non Af Amer: 54 mL/min — ABNORMAL LOW (ref >60)
Glucose, Bld: 112 mg/dL — ABNORMAL HIGH (ref 65–99)
Potassium: 4.5 mmol/L (ref 3.5–5.1)
SODIUM: 144 mmol/L (ref 135–145)

## 2015-01-26 LAB — DIGOXIN LEVEL: Digoxin Level: 0.4 ng/mL — ABNORMAL LOW (ref 0.8–2.0)

## 2015-01-26 LAB — PROTIME-INR
INR: 1.94 — AB (ref 0.00–1.49)
PROTHROMBIN TIME: 22.1 s — AB (ref 11.6–15.2)

## 2015-01-26 LAB — MAGNESIUM: Magnesium: 2.3 mg/dL (ref 1.7–2.4)

## 2015-01-26 MED ORDER — WARFARIN SODIUM 7.5 MG PO TABS
7.5000 mg | ORAL_TABLET | Freq: Once | ORAL | Status: AC
  Administered 2015-01-26: 7.5 mg via ORAL
  Filled 2015-01-26: qty 1

## 2015-01-26 MED ORDER — DIGOXIN 250 MCG PO TABS
0.2500 mg | ORAL_TABLET | Freq: Every day | ORAL | Status: DC
  Administered 2015-01-27 – 2015-01-30 (×4): 0.25 mg via ORAL
  Filled 2015-01-26 (×4): qty 1

## 2015-01-26 MED ORDER — DIGOXIN 125 MCG PO TABS
0.1250 mg | ORAL_TABLET | Freq: Once | ORAL | Status: AC
  Administered 2015-01-26: 0.125 mg via ORAL

## 2015-01-26 NOTE — Progress Notes (Signed)
Patient ID: Jay Guzman MRN: [id number] DOB/AGE: 79-17-1934 79 y.o.  Admit date: 01/23/2015 Primary Physician Odette Fraction, MD Primary Cardiologist Dr. Martinique   Patient ID: 81 y.o. male with a past medical history significant for dementia, COPD, hypertension, dyslipidemia and coronary artery disease with remote anterior MI here with new onset atrial fibrillation with RVR.  Interval history: No events.  Digoxin sub-therapeutic.  Subjective: Sleepy.  Unable to answer questions.    Medications Prior to Admission  Medication Sig Dispense Refill  . Asenapine Maleate 10 MG SUBL Place 10 mg under the tongue 2 (two) times daily.    Marland Kitchen donepezil (ARICEPT) 5 MG tablet Take 5 mg by mouth at bedtime.    Marland Kitchen doxazosin (CARDURA) 2 MG tablet Take 1 tablet (2 mg total) by mouth daily. 30 tablet 2  . ezetimibe-simvastatin (VYTORIN) 10-40 MG per tablet Take 1 tablet by mouth at bedtime. 30 tablet 2  . furosemide (LASIX) 20 MG tablet Take 20 mg by mouth daily.     Marland Kitchen lisinopril (PRINIVIL,ZESTRIL) 2.5 MG tablet Take 2.5 mg by mouth daily.    . metoprolol succinate (TOPROL-XL) 50 MG 24 hr tablet Take 1 tablet by mouth  daily 90 tablet 4  . mirtazapine (REMERON) 30 MG tablet Take 30 mg by mouth at bedtime.    . nicotine (NICODERM CQ - DOSED IN MG/24 HOURS) 21 mg/24hr patch Place 21 mg onto the skin daily.    . nitroGLYCERIN (NITROSTAT) 0.4 MG SL tablet Place 1 tablet (0.4 mg total) under the tongue every 5 (five) minutes as needed. (Patient taking differently: Place 0.4 mg under the tongue every 5 (five) minutes as needed for chest pain. ) 25 tablet 6  . ondansetron (ZOFRAN) 4 MG tablet Take 4 mg by mouth every 8 (eight) hours as needed for nausea or vomiting.    Marland Kitchen PROAIR HFA 108 (90 BASE) MCG/ACT inhaler INHALE 2 PUFFS INTO THE LUNGS EVERY 6 HOURS AS NEEDED FOR WHEEZING OR SHORTNESS OF BREATH. 8.5 Inhaler 3  . Tiotropium Bromide-Olodaterol (STIOLTO RESPIMAT) 2.5-2.5 MCG/ACT AERS Inhale 2 puffs  into the lungs daily. 4 g 5  . traZODone (DESYREL) 50 MG tablet Take 50 mg by mouth at bedtime.    Marland Kitchen warfarin (COUMADIN) 5 MG tablet Take as directed from  coumadin clinic (Patient taking differently: Take 6 mg by mouth every evening. ) 135 tablet 4     . asenapine  10 mg Sublingual BID  . cefTRIAXone (ROCEPHIN)  IV  1 g Intravenous Q24H  . digoxin  0.125 mg Oral Daily  . donepezil  5 mg Oral QHS  . doxazosin  2 mg Oral Daily  . ezetimibe-simvastatin  1 tablet Oral QHS  . furosemide  40 mg Intravenous BID  . metoprolol succinate  50 mg Oral Daily  . mirtazapine  30 mg Oral QHS  . Tiotropium Bromide-Olodaterol  2 puff Inhalation Daily  . traZODone  50 mg Oral QHS  . warfarin  7.5 mg Oral ONCE-1800  . Warfarin - Pharmacist Dosing Inpatient   Does not apply q1800    Infusions:    Allergies  Allergen Reactions  . Codeine Other (See Comments)    Reaction during surgical  Procedure-unknown  . Morphine And Related Other (See Comments)    Reaction unknown-during surgical procedure    Social History   Social History  . Marital Status: Married    Spouse Name: N/A  . Number of Children: 2  . 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 Smoking status: Current Every Day Smoker -- 1.00 packs/day for 60 years    Types: Cigarettes  . Smokeless tobacco: Not on file  . Alcohol Use: No  . Drug Use: No  . Sexual Activity: Not on file   Other Topics Concern  . Not on file   Social History Narrative    Family History  Problem Relation Age of Onset  . Cerebral aneurysm Father   . Cancer Sister   . Cancer Mother     PHYSICAL EXAM: Filed Vitals:   01/26/15 1322  BP: 92/55  Pulse: 94  Temp: 98.1 F (36.7 C)  Resp: 18     Intake/Output Summary (Last 24 hours) at 01/26/15 1349 Last data filed at 01/26/15 1300  Gross per 24 hour  Intake    840 ml  Output   1000 ml  Net   -160 ml    General:  Well appearing.  No respiratory difficulty HEENT: normal Neck: supple. no JVD. Carotids 2+ bilat; no bruits. No lymphadenopathy or thryomegaly appreciated. Cor: PMI nondisplaced. Irregularly irregular. No rubs, gallops or murmurs. Lungs: clear Abdomen: soft, nontender, nondistended. No hepatosplenomegaly. No bruits or masses. Good bowel sounds. Extremities: no cyanosis, clubbing, rash, 1+ pitting edema bilaterally Neuro: alert & oriented x 3, cranial nerves grossly intact. moves all 4 extremities w/o difficulty. Affect pleasant.  Results for orders placed or performed during the hospital encounter of 01/23/15 (from the past 24 hour(s))  Basic metabolic panel     Status: Abnormal   Collection Time: 01/26/15  5:47 AM  Result Value Ref Range   Sodium 144 135 - 145 mmol/L   Potassium 4.5 3.5 - 5.1 mmol/L   Chloride 103 101 - 111 mmol/L   CO2 35 (H) 22 - 32 mmol/L   Glucose, Bld 112 (H) 65 - 99 mg/dL   BUN 28 (H) 6 - 20 mg/dL   Creatinine, Ser 1.24 0.61 - 1.24 mg/dL   Calcium 8.4 (L) 8.9 - 10.3 mg/dL   GFR calc non Af Amer 54 (L) >60 mL/min   GFR calc Af Amer >60 >60 mL/min   Anion gap 6 5 - 15  Protime-INR     Status: Abnormal   Collection Time: 01/26/15  5:47 AM  Result Value Ref Range   Prothrombin Time 22.1 (H) 11.6 - 15.2 seconds   INR 1.94 (H) 0.00 - 1.49  Digoxin level     Status: Abnormal   Collection Time: 01/26/15  5:47 AM  Result Value Ref Range   Digoxin Level 0.4 (L) 0.8 - 2.0 ng/mL  Magnesium     Status: None   Collection Time: 01/26/15  5:47 AM  Result Value Ref Range   Magnesium 2.3 1.7 - 2.4 mg/dL   No results found.  ECG: atrial fibrillation.  Rate 97 bpm.  Prior anterolateral infarct.  Telemetry:  Atrial fibrillation, rates mostly 80s.  Occasional HR in the low 100s.  Occasional PVCs.  ASSESSMENT/PLAN: #  Atrial fibrillation with RVR: Rates better controlled on digoxin.  Level was subtherapeutic.  - Increase digoxin to 0.25mg  - Repeat level in 3 days to 1 week.  Can be  done as an outpatient. - Warfarin per pharmacy consult - Consider TEE/DCCV as an outpatient if he remains SOB after treatment for COPD exacerbation  # Acute on chronic systolic heart failure. Weight down 4 lb since yesterday, but Ins/Outs appear to be positive.  Clinically he is improving but continues to be mildly volume overloaded.   - Continue lasxi 40mg  IV bid.  Consider switching to oral in the next day or two. - Continue digoxin as above - Metoprolol succinate 50mg  daily  # COPD/UTI: Infection management per primary team    Signed: Sharol Harness 01/26/2015, 1:49 PM

## 2015-01-26 NOTE — Progress Notes (Signed)
ANTICOAGULATION CONSULT NOTE - Follow Up Consult  Pharmacy Consult for Warfarin Indication: atrial fibrillation and hx mural thrombus  Allergies  Allergen Reactions  . Codeine Other (See Comments)    Reaction during surgical  Procedure-unknown  . Morphine And Related Other (See Comments)    Reaction unknown-during surgical procedure    Labs:  Recent Labs  01/23/15 1943  01/24/15 0507 01/25/15 0411 01/26/15 0547  HGB 14.3  --   --   --   --   HCT 44.5  --   --   --   --   PLT 191  --   --   --   --   LABPROT  --   < > 16.9* 19.6* 22.1*  INR  --   < > 1.36 1.66* 1.94*  CREATININE 1.41*  --  1.46* 1.45* 1.24  < > = values in this interval not displayed.  Estimated Creatinine Clearance: 48.3 mL/min (by C-G formula based on Cr of 1.24).   Assessment: 81 y/o male who presents with worsening confusion and 10-pound weight gain over past 3d, admitted for acute-on-chronic CHF. He takes chronic warfarin for hx mural thrombus but also has new onset Afib. INR is subtherapeutic at 1.94. No bleeding noted, CBC stable PTA: 6 mg daily  Goal of Therapy:  INR 2-3 Monitor platelets by anticoagulation protocol: Yes   Plan:  - Warfarin 7.5 mg PO tonight - INR daily - Monitor for s/sx of bleeding  Thank you Anette Guarneri, PharmD [phone]  01/26/2015 12:42 PM

## 2015-01-26 NOTE — Progress Notes (Signed)
Patient Demographics:    Jay Guzman, is a 81 y.o. male, DOB - 02/13/1933, CNO:[id number]  Admit date - 01/23/2015   Admitting Physician Etta Quill, DO  Outpatient Primary MD for the patient is Odette Fraction, MD  LOS - 3   Chief Complaint  Patient presents with  . Weight Gain  . Altered Mental Status        Subjective:    Jay Guzman today has, No headache, No chest pain, No abdominal pain - No Nausea, No new weakness tingling or numbness, No Cough - improved SOB.     Assessment  & Plan :     1. Acute on chronic hypoxic respiratory failure secondary to acute on chronic systolic heart failure. Last EF 45% on echogram done in April of this ear. He has had 10 pound weight gain, has orthopnea, has been placed on IV Lasix, continue salt and fluid restriction, continue beta blocker, ACE inhibitor skipped due to ARF,  monitor BMP and electrolytes. Cardiology following. Continue supportive care with oxygen and nebulizer treatments as needed.  Filed Weights   01/24/15 0500 01/25/15 0407 01/26/15 0443  Weight: 84.233 kg (185 lb 11.2 oz) 83.462 kg (184 lb) 81.511 kg (179 lb 11.2 oz)     2. ARF. Baseline creatinine is close to 1, resolved with holding ACE. We will monitor.   3. Dementia. At risk for delirium. Continue home medications. Minimize benzodiazepine and narcotic use. No acute worsening.   4. UTI. Continue Rocephin.   5. Essential hypertension. Continue beta blocker, add nitro paste and monitor.   6. History of mural thrombus, New onset atrial fibrillation likely paroxysmal found in the ER. [place] VASC score greater than 3. On Coumadin and beta blocker continue, had to place him on low-dose digoxin due to RVR and borderline blood pressures, cardiology on board. Pharmacy  monitoring Coumadin. Goal will be rate control. Stable digoxin level.   7. Dyslipidemia. Continue home dose statin.   8. History of COPD. At baseline. Minimal wheezing likely due to CHF. Continue supportive care with the mobilizer treatments and oxygen and monitor.    Code Status : Full  Family Communication  : None present  Disposition Plan  : TBD  Consults  :   Cards  Procedures  :    Echo - [id number]  Mild LVH with LVEF approximately 45%, akinesis noted of the midto distal anteroseptal, anterior, and inferior walls as well asapex consistent with ischemic cardiomyopathy. History of chronicLV mural thrombus described - not well visualized with this study (could consider microbubble contrast study if further assessmentneeded). Grade 1 diastolic dysfunction. MAC with mild mitralregurgitation. Sclerotic aortic valve with mild aorticregurgitation. Mild tricuspid regurgitation with PASP 53 mmHg.  DVT Prophylaxis  :  Coumadin  Lab Results  Component Value Date   INR 1.94* 01/26/2015   INR 1.66* 01/25/2015   INR 1.36 01/24/2015     Lab Results  Component Value Date   PLT 191 01/23/2015    Inpatient Medications  Scheduled Meds: . asenapine  10 mg Sublingual BID  . cefTRIAXone (ROCEPHIN)  IV  1 g Intravenous Q24H  . digoxin  0.125 mg Oral Daily  . donepezil  5 mg Oral QHS  . doxazosin  2 mg Oral  Daily  . ezetimibe-simvastatin  1 tablet Oral QHS  . furosemide  40 mg Intravenous BID  . metoprolol succinate  50 mg Oral Daily  . mirtazapine  30 mg Oral QHS  . Tiotropium Bromide-Olodaterol  2 puff Inhalation Daily  . traZODone  50 mg Oral QHS  . Warfarin - Pharmacist Dosing Inpatient   Does not apply q1800   Continuous Infusions:  PRN Meds:.acetaminophen, albuterol, metoprolol, ondansetron (ZOFRAN) IV  Antibiotics  :     Anti-infectives    Start     Dose/Rate Route Frequency Ordered Stop   01/23/15 2330  cefTRIAXone (ROCEPHIN) 1 g in dextrose 5 % 50 mL IVPB     1  g 100 mL/hr over 30 Minutes Intravenous Every 24 hours 01/23/15 2319     01/23/15 2300  vancomycin (VANCOCIN) 1,250 mg in sodium chloride 0.9 % 250 mL IVPB  Status:  Discontinued     1,250 mg 166.7 mL/hr over 90 Minutes Intravenous  Once 01/23/15 2236 01/23/15 2313   01/23/15 2245  piperacillin-tazobactam (ZOSYN) IVPB 3.375 g  Status:  Discontinued     3.375 g 100 mL/hr over 30 Minutes Intravenous  Once 01/23/15 2236 01/23/15 2313        Objective:   Filed Vitals:   01/25/15 1300 01/25/15 2023 01/26/15 0443 01/26/15 0952  BP: 126/90 106/80 109/59   Pulse: 72 71 51   Temp: 98 F (36.7 C) 98.3 F (36.8 C) 97.7 F (36.5 C)   TempSrc: Oral Oral Oral   Resp: 18 18 18    Height:      Weight:   81.511 kg (179 lb 11.2 oz)   SpO2: 96% 96% 95% 94%    Wt Readings from Last 3 Encounters:  01/26/15 81.511 kg (179 lb 11.2 oz)  01/10/15 78.019 kg (172 lb)  12/25/14 88.451 kg (195 lb)     Intake/Output Summary (Last 24 hours) at 01/26/15 1100 Last data filed at 01/26/15 0852  Gross per 24 hour  Intake    840 ml  Output      0 ml  Net    840 ml     Physical Exam  Awake Alert, Oriented X 2, No new F.N deficits, Normal affect Esto.AT,[name] Supple Neck,No JVD, No cervical lymphadenopathy appriciated.  Symmetrical Chest wall movement, Good air movement bilaterally, basilar rales and wheezes RRR,No Gallops,Rubs or new Murmurs, No Parasternal Heave +ve B.Sounds, Abd Soft, No tenderness, No organomegaly appriciated, No rebound - guarding or rigidity. No Cyanosis, Clubbing , 1+ edema, No new Rash or bruise       Data Review:   Micro Results No results found for this or any previous visit (from the past 240 hour(s)).  Radiology Reports Dg Chest 2 View  01/23/2015   CLINICAL DATA:  Possibly urinary tract infection with confusion  EXAM: CHEST  2 VIEW  COMPARISON:  12/25/2014  FINDINGS: COPD with cardiac enlargement. These findings are stable. Vascular pattern normal. Bilateral lower  lobe interstitial infiltrates new from prior study. Small bilateral effusions.  IMPRESSION: Bilateral lower lobe interstitial infiltrates right greater than left concerning for pneumonia.Mild pulmonary edema considered less likely.   Electronically Signed   By: Skipper Cliche M.D.   On: 01/23/2015 19:20     CBC  Recent Labs Lab 01/23/15 1943  WBC 9.9  HGB 14.3  HCT 44.5  PLT 191  MCV 102.5*  MCH 32.9  MCHC 32.1  RDW 15.3    Chemistries   Recent Labs Lab 01/23/15 1943  01/24/15 0507 01/25/15 0411 01/26/15 0547  NA 139 142 144 144  K 4.4 4.0 4.3 4.5  CL 103 103 104 103  CO2 29 32 34* 35*  GLUCOSE 101* 93 107* 112*  BUN 33* 31* 31* 28*  CREATININE 1.41* 1.46* 1.45* 1.24  CALCIUM 8.0* 8.0* 8.0* 8.4*  MG  --   --   --  2.3   ------------------------------------------------------------------------------------------------------------------ estimated creatinine clearance is 48.3 mL/min (by C-G formula based on Cr of 1.24). ------------------------------------------------------------------------------------------------------------------ No results for input(s): HGBA1C in the last 72 hours. ------------------------------------------------------------------------------------------------------------------ No results for input(s): CHOL, HDL, LDLCALC, TRIG, CHOLHDL, LDLDIRECT in the last 72 hours. ------------------------------------------------------------------------------------------------------------------ No results for input(s): TSH, T4TOTAL, T3FREE, THYROIDAB in the last 72 hours.  Invalid input(s): FREET3 ------------------------------------------------------------------------------------------------------------------ No results for input(s): VITAMINB12, FOLATE, FERRITIN, TIBC, IRON, RETICCTPCT in the last 72 hours.  Coagulation profile  Recent Labs Lab 01/23/15 2110 01/24/15 0507 01/25/15 0411 01/26/15 0547  INR 1.36 1.36 1.66* 1.94*    No results for input(s):  DDIMER in the last 72 hours.  Cardiac Enzymes No results for input(s): CKMB, TROPONINI, MYOGLOBIN in the last 168 hours.  Invalid input(s): CK ------------------------------------------------------------------------------------------------------------------ Invalid input(s): POCBNP   Time Spent in minutes 35   SINGH,PRASHANT K M.D on 01/26/2015 at 11:00 AM  Between 7am to 7pm - Pager - [phone]  After 7pm go to [email or web address] - password [facility]  [facility] -  Office  [phone]

## 2015-01-27 LAB — PROTIME-INR
INR: 2.15 — AB (ref 0.00–1.49)
PROTHROMBIN TIME: 23.8 s — AB (ref 11.6–15.2)

## 2015-01-27 MED ORDER — WARFARIN SODIUM 3 MG PO TABS
6.0000 mg | ORAL_TABLET | Freq: Once | ORAL | Status: AC
  Administered 2015-01-27: 6 mg via ORAL
  Filled 2015-01-27: qty 2

## 2015-01-27 NOTE — Progress Notes (Signed)
ANTICOAGULATION CONSULT NOTE - Follow Up Consult  Pharmacy Consult for Warfarin Indication: atrial fibrillation and hx mural thrombus  Allergies  Allergen Reactions  . Codeine Other (See Comments)    Reaction during surgical  Procedure-unknown  . Morphine And Related Other (See Comments)    Reaction unknown-during surgical procedure    Labs:  Recent Labs  01/25/15 0411 01/26/15 0547 01/27/15 0317  LABPROT 19.6* 22.1* 23.8*  INR 1.66* 1.94* 2.15*  CREATININE 1.45* 1.24  --     Estimated Creatinine Clearance: 48.3 mL/min (by C-G formula based on Cr of 1.24).   Assessment: 81 y/o male who presents with worsening confusion and 10-pound weight gain, admitted for acute-on-chronic CHF. He takes chronic warfarin for hx mural thrombus but also has new onset Afib. INR is at goal (INR= 2.15) with trend up.   PTA coumadin dose: 6 mg daily  Goal of Therapy:  INR 2-3 Monitor platelets by anticoagulation protocol: Yes   Plan:  - Warfarin 6mg  PO tonight - INR daily - Monitor for s/sx of bleeding  Hildred Laser, Pharm D 01/27/2015 9:35 AM

## 2015-01-27 NOTE — Care Management Important Message (Signed)
Important Message  Patient Details  Name: Jay Guzman MRN: [id number] Date of Birth: 08/24/1932   Medicare Important Message Given:  Yes-second notification given    Loann Quill 01/27/2015, 12:54 PM

## 2015-01-27 NOTE — Progress Notes (Signed)
Patient Demographics:    Jay Guzman, is a 81 y.o. male, DOB - 12-31-1932, EYC:[id number]  Admit date - 01/23/2015   Admitting Physician Etta Quill, DO  Outpatient Primary MD for the patient is Odette Fraction, MD  LOS - 4   Chief Complaint  Patient presents with  . Weight Gain  . Altered Mental Status        Subjective:    Jay Guzman today has, No headache, No chest pain, No abdominal pain - No Nausea, No new weakness tingling or numbness, No Cough - improved SOB.     Assessment  & Plan :     1. Acute on chronic hypoxic respiratory failure secondary to acute on chronic systolic heart failure. Last EF 45% on echogram done in April of this ear. He has had 10 pound weight gain, has orthopnea, has been placed on IV Lasix, continue salt and fluid restriction, continue beta blocker, ACE inhibitor skipped due to ARF, we will request PCP and primary cardiologist to continue to monitor BMP and electrolytes, if renal function stays stable low-dose ACE inhibitor can be added. Cardiology following.   Continue supportive care with oxygen and nebulizer treatments as needed. Likely discharge in the morning.  Filed Weights   01/25/15 0407 01/26/15 0443 01/27/15 0622  Weight: 83.462 kg (184 lb) 81.511 kg (179 lb 11.2 oz) 81.058 kg (178 lb 11.2 oz)     2. ARF. Baseline creatinine is close to 1, resolving with holding ACE. We will monitor.   3. Dementia. At risk for delirium. Mild intermittent delirium due to hospitalization, Continue home medications. Minimize benzodiazepine and narcotic use.    4. UTI. Continue Rocephin.   5. Essential hypertension. Continue beta blocker and diuretic, stable.   6. History of mural thrombus, New onset atrial fibrillation likely paroxysmal found in the  ER. [place] VASC score greater than 3. On Coumadin and beta blocker continue, had to place him on low-dose digoxin due to RVR and borderline blood pressures, cardiology on board. Pharmacy monitoring Coumadin. Goal will be rate control. Stable digoxin level.   7. Dyslipidemia. Continue home dose statin.   8. History of COPD. At baseline. Minimal wheezing likely due to CHF. Continue supportive care with the mobilizer treatments and oxygen and monitor.    Code Status : Full  Family Communication  : None present  Disposition Plan  : TBD  Consults  :   Cards  Procedures  :    Echo - [phone]  Mild LVH with LVEF approximately 45%, akinesis noted of the midto distal anteroseptal, anterior, and inferior walls as well asapex consistent with ischemic cardiomyopathy. History of chronicLV mural thrombus described - not well visualized with this study (could consider microbubble contrast study if further assessmentneeded). Grade 1 diastolic dysfunction. MAC with mild mitralregurgitation. Sclerotic aortic valve with mild aorticregurgitation. Mild tricuspid regurgitation with PASP 53 mmHg.  DVT Prophylaxis  :  Coumadin  Lab Results  Component Value Date   INR 2.15* 01/27/2015   INR 1.94* 01/26/2015   INR 1.66* 01/25/2015     Lab Results  Component Value Date   PLT 191 01/23/2015    Inpatient Medications  Scheduled Meds: . asenapine  10 mg Sublingual BID  . cefTRIAXone (ROCEPHIN)  IV  1 g Intravenous Q24H  . digoxin  0.25 mg Oral Daily  . donepezil  5 mg Oral QHS  . doxazosin  2 mg Oral Daily  . ezetimibe-simvastatin  1 tablet Oral QHS  . furosemide  40 mg Intravenous BID  . metoprolol succinate  50 mg Oral Daily  . mirtazapine  30 mg Oral QHS  . Tiotropium Bromide-Olodaterol  2 puff Inhalation Daily  . traZODone  50 mg Oral QHS  . warfarin  6 mg Oral ONCE-1800  . Warfarin - Pharmacist Dosing Inpatient   Does not apply q1800   Continuous Infusions:  PRN Meds:.acetaminophen,  albuterol, metoprolol, ondansetron (ZOFRAN) IV  Antibiotics  :     Anti-infectives    Start     Dose/Rate Route Frequency Ordered Stop   01/23/15 2330  cefTRIAXone (ROCEPHIN) 1 g in dextrose 5 % 50 mL IVPB     1 g 100 mL/hr over 30 Minutes Intravenous Every 24 hours 01/23/15 2319     01/23/15 2300  vancomycin (VANCOCIN) 1,250 mg in sodium chloride 0.9 % 250 mL IVPB  Status:  Discontinued     1,250 mg 166.7 mL/hr over 90 Minutes Intravenous  Once 01/23/15 2236 01/23/15 2313   01/23/15 2245  piperacillin-tazobactam (ZOSYN) IVPB 3.375 g  Status:  Discontinued     3.375 g 100 mL/hr over 30 Minutes Intravenous  Once 01/23/15 2236 01/23/15 2313        Objective:   Filed Vitals:   01/26/15 2020 01/27/15 0622 01/27/15 0805 01/27/15 0920  BP: 100/49 115/76 125/73 104/64  Pulse: 98 71 77 85  Temp: 97.6 F (36.4 C) 98 F (36.7 C) 97.4 F (36.3 C)   TempSrc: Oral Oral Oral   Resp: 18 19 20 20   Height:      Weight:  81.058 kg (178 lb 11.2 oz)    SpO2: 98% 97% 95% 95%    Wt Readings from Last 3 Encounters:  01/27/15 81.058 kg (178 lb 11.2 oz)  01/10/15 78.019 kg (172 lb)  12/25/14 88.451 kg (195 lb)     Intake/Output Summary (Last 24 hours) at 01/27/15 1015 Last data filed at 01/27/15 0957  Gross per 24 hour  Intake   1740 ml  Output   3351 ml  Net  -1611 ml     Physical Exam  Awake Alert, Oriented X 2, No new F.N deficits, Normal affect Clint.AT,[name] Supple Neck,No JVD, No cervical lymphadenopathy appriciated.  Symmetrical Chest wall movement, Good air movement bilaterally, basilar rales and wheezes RRR,No Gallops,Rubs or new Murmurs, No Parasternal Heave +ve B.Sounds, Abd Soft, No tenderness, No organomegaly appriciated, No rebound - guarding or rigidity. No Cyanosis, Clubbing , 1+ edema, No new Rash or bruise       Data Review:   Micro Results No results found for this or any previous visit (from the past 240 hour(s)).  Radiology Reports Dg Chest 2  View  01/23/2015   CLINICAL DATA:  Possibly urinary tract infection with confusion  EXAM: CHEST  2 VIEW  COMPARISON:  12/25/2014  FINDINGS: COPD with cardiac enlargement. These findings are stable. Vascular pattern normal. Bilateral lower lobe interstitial infiltrates new from prior study. Small bilateral effusions.  IMPRESSION: Bilateral lower lobe interstitial infiltrates right greater than left concerning for pneumonia.Mild pulmonary edema considered less likely.   Electronically Signed   By: Skipper Cliche M.D.   On: 01/23/2015 19:20     CBC  Recent Labs Lab 01/23/15 1943  WBC 9.9  HGB 14.3  HCT 44.5  PLT 191  MCV 102.5*  MCH 32.9  MCHC 32.1  RDW 15.3    Chemistries   Recent Labs Lab 01/23/15 1943 01/24/15 0507 01/25/15 0411 01/26/15 0547  NA 139 142 144 144  K 4.4 4.0 4.3 4.5  CL 103 103 104 103  CO2 29 32 34* 35*  GLUCOSE 101* 93 107* 112*  BUN 33* 31* 31* 28*  CREATININE 1.41* 1.46* 1.45* 1.24  CALCIUM 8.0* 8.0* 8.0* 8.4*  MG  --   --   --  2.3   ------------------------------------------------------------------------------------------------------------------ estimated creatinine clearance is 48.3 mL/min (by C-G formula based on Cr of 1.24). ------------------------------------------------------------------------------------------------------------------ No results for input(s): HGBA1C in the last 72 hours. ------------------------------------------------------------------------------------------------------------------ No results for input(s): CHOL, HDL, LDLCALC, TRIG, CHOLHDL, LDLDIRECT in the last 72 hours. ------------------------------------------------------------------------------------------------------------------ No results for input(s): TSH, T4TOTAL, T3FREE, THYROIDAB in the last 72 hours.  Invalid input(s): FREET3 ------------------------------------------------------------------------------------------------------------------ No results for  input(s): VITAMINB12, FOLATE, FERRITIN, TIBC, IRON, RETICCTPCT in the last 72 hours.  Coagulation profile  Recent Labs Lab 01/23/15 2110 01/24/15 0507 01/25/15 0411 01/26/15 0547 01/27/15 0317  INR 1.36 1.36 1.66* 1.94* 2.15*    No results for input(s): DDIMER in the last 72 hours.  Cardiac Enzymes No results for input(s): CKMB, TROPONINI, MYOGLOBIN in the last 168 hours.  Invalid input(s): CK ------------------------------------------------------------------------------------------------------------------ Invalid input(s): POCBNP   Time Spent in minutes 35   Lala Lund K M.D on 01/27/2015 at 10:15 AM  Between 7am to 7pm - Pager - [phone]  After 7pm go to [email or web address] - password [facility]  [facility] -  Office  [phone]

## 2015-01-27 NOTE — Progress Notes (Signed)
Patient Name: Jay Guzman Date of Encounter: 01/27/2015  Primary Cardiologist Dr. Martinique   Principal Problem:   Acute on chronic systolic CHF (congestive heart failure), NYHA class 4 Active Problems:   Essential hypertension   COPD with acute exacerbation   Dementia   UTI (lower urinary tract infection)   New onset a-fib    SUBJECTIVE  Denies any CP. SOB improving.  CURRENT MEDS . asenapine  10 mg Sublingual BID  . cefTRIAXone (ROCEPHIN)  IV  1 g Intravenous Q24H  . digoxin  0.25 mg Oral Daily  . donepezil  5 mg Oral QHS  . doxazosin  2 mg Oral Daily  . ezetimibe-simvastatin  1 tablet Oral QHS  . furosemide  40 mg Intravenous BID  . metoprolol succinate  50 mg Oral Daily  . mirtazapine  30 mg Oral QHS  . Tiotropium Bromide-Olodaterol  2 puff Inhalation Daily  . traZODone  50 mg Oral QHS  . warfarin  6 mg Oral ONCE-1800  . Warfarin - Pharmacist Dosing Inpatient   Does not apply q1800    OBJECTIVE  Filed Vitals:   01/26/15 2020 01/27/15 0622 01/27/15 0805 01/27/15 0920  BP: 100/49 115/76 125/73 104/64  Pulse: 98 71 77 85  Temp: 97.6 F (36.4 C) 98 F (36.7 C) 97.4 F (36.3 C)   TempSrc: Oral Oral Oral   Resp: 18 19 20 20   Height:      Weight:  178 lb 11.2 oz (81.058 kg)    SpO2: 98% 97% 95% 95%    Intake/Output Summary (Last 24 hours) at 01/27/15 0944 Last data filed at 01/27/15 0921  Gross per 24 hour  Intake   1740 ml  Output   2051 ml  Net   -311 ml   Filed Weights   01/25/15 0407 01/26/15 0443 01/27/15 0622  Weight: 184 lb (83.462 kg) 179 lb 11.2 oz (81.511 kg) 178 lb 11.2 oz (81.058 kg)    PHYSICAL EXAM  General: Pleasant, NAD. Neuro: Alert and oriented X 3. Moves all extremities spontaneously. Psych: Normal affect. HEENT:  Normal  Neck: Supple without bruits  +JVD. Lungs:  Resp regular and unlabored. Bibasilar rale. Heart: Irregular. no s3, s4, or murmurs. Abdomen: Soft, non-tender, non-distended, BS + x 4.  Extremities: No  clubbing, cyanosis. DP/PT/Radials 2+ and equal bilaterally. +1 pitting edema  Accessory Clinical Findings  Basic Metabolic Panel  Recent Labs  01/25/15 0411 01/26/15 0547  NA 144 144  K 4.3 4.5  CL 104 103  CO2 34* 35*  GLUCOSE 107* 112*  BUN 31* 28*  CREATININE 1.45* 1.24  CALCIUM 8.0* 8.4*  MG  --  2.3    TELE Off telemetry    ECG  No new EKG  Echocardiogram 08/27/2014  LV EF: 45% -  50%  ------------------------------------------------------------------- Indications:   CHF - 428.0.  ------------------------------------------------------------------- Study Conclusions  - Left ventricle: The cavity size was mildly dilated. Wall thickness was increased in a pattern of mild LVH. Systolic function was mildly reduced. The estimated ejection fraction was in the range of 45% to 50%. There is akinesis of the mid-apicalanteroseptal, anterior, inferior, and apical myocardium. Doppler parameters are consistent with abnormal left ventricular relaxation (grade 1 diastolic dysfunction). - Aortic valve: Mildly to moderately calcified annulus. Trileaflet; moderately calcified leaflets. There was mild regurgitation. - Mitral valve: Calcified annulus. There was mild regurgitation. - Left atrium: The atrium was moderately dilated. - Right atrium: The atrium was mildly dilated. Central venous pressure (est): 8  mm Hg. - Tricuspid valve: There was mild regurgitation. - Pulmonary arteries: PA peak pressure: 53 mm Hg (S). - Pericardium, extracardiac: There was no pericardial effusion.  Impressions:  - Mild LVH with LVEF approximately 45%, akinesis noted of the mid to distal anteroseptal, anterior, and inferior walls as well as apex consistent with ischemic cardiomyopathy. History of chronic LV mural thrombus described - not well visualized with this study (could consider microbubble contrast study if further assessment needed). Grade 1 diastolic  dysfunction. MAC with mild mitral regurgitation. Sclerotic aortic valve with mild aortic regurgitation. Mild tricuspid regurgitation with PASP 53 mmHg.    Radiology/Studies  Dg Chest 2 View  01/23/2015   CLINICAL DATA:  Possibly urinary tract infection with confusion  EXAM: CHEST  2 VIEW  COMPARISON:  12/25/2014  FINDINGS: COPD with cardiac enlargement. These findings are stable. Vascular pattern normal. Bilateral lower lobe interstitial infiltrates new from prior study. Small bilateral effusions.  IMPRESSION: Bilateral lower lobe interstitial infiltrates right greater than left concerning for pneumonia.Mild pulmonary edema considered less likely.   Electronically Signed   By: Skipper Cliche M.D.   On: 01/23/2015 19:20    ASSESSMENT AND PLAN  81 y.o. male with a past medical history significant for dementia, COPD, hypertension, dyslipidemia and coronary artery disease with remote anterior MI here with new onset atrial fibrillation with RVR.  1. Newly diagnosed PAF   - continue metoprolol XL and digoxin. On Coumadin, INR now therapeutic. His INR was subtherapeutic on arrival.  - plan for outpatient DCCV after one month of therapeutic INR. Rate controlled, off telemetry. Need to recheck dig level in 1 week   2. Acute on chronic systolic HF  - Weight on arrival 186 lbs, weight on followup in Jan 174 lbs, current weight 178 lbs.   - he still has 1+ pitting edema, +JVD and bibasilar rale. Will continue IV lasix, maybe ago able to transition PO lasix.    3. COPD  4. UTI  5. CAD with remote anterior MI  6. HTN 7. HLD 8. H/o chronic LV mural thrombus  Signed, Woodward Ku Pager: [phone]   The patient was seen, examined and discussed with Almyra Deforest, PA-C and I agree with the above.   81 year old male with newly dg a-fib, rate controlled, with acute on chronic systolic CHF, diuresed 8 lbs on iv lasix, we will continue iv lasix for 1 more day, crea is improving. Switch to PO lasix  tomorrow, anticipated discharge to SNF possibly in 1-2 days.  INR 2.15.  Dorothy Spark 01/27/2015

## 2015-01-28 ENCOUNTER — Ambulatory Visit: Payer: Medicare Other | Admitting: Family Medicine

## 2015-01-28 LAB — PROTIME-INR
INR: 2.22 — AB (ref 0.00–1.49)
Prothrombin Time: 24.4 seconds — ABNORMAL HIGH (ref 11.6–15.2)

## 2015-01-28 MED ORDER — METOLAZONE 2.5 MG PO TABS
2.5000 mg | ORAL_TABLET | Freq: Once | ORAL | Status: AC
  Administered 2015-01-28: 2.5 mg via ORAL
  Filled 2015-01-28: qty 1

## 2015-01-28 MED ORDER — WARFARIN SODIUM 3 MG PO TABS
6.0000 mg | ORAL_TABLET | Freq: Once | ORAL | Status: AC
  Administered 2015-01-28: 6 mg via ORAL
  Filled 2015-01-28: qty 2

## 2015-01-28 NOTE — Progress Notes (Signed)
Physical Therapy Treatment Patient Details Name: Jay TRETO MRN: [id number] DOB: [birth year] Today's Date: 01/28/2015    History of Present Illness 81 y.o. male with h/o dementia, presents to ED with son with worsening confusion over the past couple of days. He also has h/o systolic CHF, 10 lb weight gain over the past 3 days    PT Comments    Pt with continued confusion and limited activity tolerance with need for assist for safety and stability with gait and transfers. Niece present throughout session to clarify pt PLOF, home assist and difficulty at home. Per niece, pt family cannot provide care at home as wife has dementia, one son works and the other has had multiple strokes. Son Deveron Furlong on the phone during session to confirm lack of 24hr min assist or even assist with mobility consistently. Pt would benefit from ST-SNF to maximize safety and gait as well as long term planning for decreased cognition. In order to return home pt will need minguard assist for all mobility as well as supervision 24hrs for safety. Per niece pt has tendency to wander and was missing for 4 days from home. Will continue to follow acutely.   Follow Up Recommendations  SNF;Supervision/Assistance - 24 hour     Equipment Recommendations  Rolling walker with 5" wheels    Recommendations for Other Services       Precautions / Restrictions Precautions Precautions: Fall    Mobility  Bed Mobility Overal bed mobility: Needs Assistance Bed Mobility: Supine to Sit     Supine to sit: Supervision     General bed mobility comments: cues for initiation, safety and sequence. Increased time to complete and sequential cues for return to bed and positioning in bed  Transfers Overall transfer level: Needs assistance   Transfers: Sit to/from Stand Sit to Stand: Supervision         General transfer comment: cues for hand placement and safety  Ambulation/Gait Ambulation/Gait assistance: Min  guard Ambulation Distance (Feet): 175 Feet Assistive device: Rolling walker (2 wheeled) Gait Pattern/deviations: Step-through pattern;Decreased stride length;Trunk flexed   Gait velocity interpretation: Below normal speed for age/gender General Gait Details: cues for posture, position in RW with 1 standing rest breaks. Pt unable to accurately self assess and regulate activity and requires mod cues for ceasing ambulation   Stairs            Wheelchair Mobility    Modified Rankin (Stroke Patients Only)       Balance Overall balance assessment: Needs assistance   Sitting balance-Leahy Scale: Fair       Standing balance-Leahy Scale: Poor                      Cognition Arousal/Alertness: Awake/alert Behavior During Therapy: WFL for tasks assessed/performed Overall Cognitive Status: History of cognitive impairments - at baseline Area of Impairment: Memory;Orientation;Awareness;Problem solving;Safety/judgement Orientation Level: Disoriented to;Situation;Place   Memory: Decreased short-term memory   Safety/Judgement: Decreased awareness of safety;Decreased awareness of deficits   Problem Solving: Slow processing;Requires verbal cues      Exercises General Exercises - Lower Extremity Long Arc Quad: AROM;Seated;Both;15 reps Hip Flexion/Marching: AROM;Seated;Both;10 reps    General Comments        Pertinent Vitals/Pain Pain Assessment: No/denies pain    Home Living                      Prior Function            PT  Goals (current goals can now be found in the care plan section) Progress towards PT goals: Progressing toward goals    Frequency       PT Plan Discharge plan needs to be updated    Co-evaluation             End of Session Equipment Utilized During Treatment: Gait belt Activity Tolerance: Patient tolerated treatment well Patient left: in bed;with call bell/phone within reach;with bed alarm set;with family/visitor  present     Time: 1035-1105 PT Time Calculation (min) (ACUTE ONLY): 30 min  Charges:  $Gait Training: 8-22 mins $Therapeutic Exercise: 8-22 mins                    G Codes:      Melford Aase 02-18-2015, 12:59 PM Elwyn Reach, [place]

## 2015-01-28 NOTE — Progress Notes (Signed)
Patient Name: Jay Guzman Date of Encounter: 01/28/2015  Principal Problem:   Acute on chronic systolic CHF (congestive heart failure), NYHA class 4 Active Problems:   Essential hypertension   COPD with acute exacerbation   Dementia   UTI (lower urinary tract infection)   New onset a-fib   Primary Cardiologist: Dr Martinique  Patient Profile: 81 yo male w/ dementia, COPD, HTN, HL, hx anterior MI, LAD 100%, Apical thrombus on coumadin, S-CHF w/ EF 45-50%, PAS 53. Admitted 09/08 w/ CHF.   SUBJECTIVE: Pleasant, cooperative, denies chest pain, says SOB is better.   OBJECTIVE Filed Vitals:   01/27/15 1130 01/27/15 2037 01/28/15 0639 01/28/15 1231  BP: 106/68 107/59 116/59 123/65  Pulse: 85 63 83 91  Temp: 97.8 F (36.6 C) 97.5 F (36.4 C) 98.3 F (36.8 C) 97.7 F (36.5 C)  TempSrc: Oral Oral Oral Oral  Resp: 18 18 20 18   Height:      Weight:   176 lb 1.6 oz (79.878 kg)   SpO2: 98% 96% 97% 95%    Intake/Output Summary (Last 24 hours) at 01/28/15 1315 Last data filed at 01/28/15 0949  Gross per 24 hour  Intake    290 ml  Output   2850 ml  Net  -2560 ml   Filed Weights   01/26/15 0443 01/27/15 0622 01/28/15 0639  Weight: 179 lb 11.2 oz (81.511 kg) 178 lb 11.2 oz (81.058 kg) 176 lb 1.6 oz (79.878 kg)    PHYSICAL EXAM General: Well developed, well nourished, male in no acute distress. Head: Normocephalic, atraumatic.  Neck: Supple without bruits, JVD 10 cm. Lungs:  Resp regular and unlabored, rales bilateral bases. Heart: Irreg R&R, S1, S2, no S3, S4, or murmur; no rub. Abdomen: Soft, non-tender, non-distended, BS + x 4.  Extremities: No clubbing, cyanosis, 2+ pedal edema.  Neuro: Alert and oriented X 2. Moves all extremities spontaneously. Psych: Normal affect.  LABS: INR: Recent Labs  01/28/15 0307  INR 3.71*   Basic Metabolic Panel: Recent Labs  01/26/15 0547  NA 144  K 4.5  CL 103  CO2 35*  GLUCOSE 112*  BUN 28*  CREATININE 1.24  CALCIUM  8.4*  MG 2.3   BNP:  B NATRIURETIC PEPTIDE  Date/Time Value Ref Range Status  01/23/2015 07:43 PM 1033.2* 0.0 - 100.0 pg/mL Final  08/26/2014 06:10 PM 656.0* 0.0 - 100.0 pg/mL Final    TELE: not on        Current Medications:  . asenapine  10 mg Sublingual BID  . digoxin  0.25 mg Oral Daily  . donepezil  5 mg Oral QHS  . doxazosin  2 mg Oral Daily  . ezetimibe-simvastatin  1 tablet Oral QHS  . furosemide  40 mg Intravenous BID  . metoprolol succinate  50 mg Oral Daily  . mirtazapine  30 mg Oral QHS  . Tiotropium Bromide-Olodaterol  2 puff Inhalation Daily  . traZODone  50 mg Oral QHS  . warfarin  6 mg Oral ONCE-1800  . Warfarin - Pharmacist Dosing Inpatient   Does not apply q1800      ASSESSMENT AND PLAN: Principal Problem:   Acute on chronic systolic CHF (congestive heart failure), NYHA class 4 - weight 08/26 was 172 - I/O neg by 3.3 L so far - continue IV Lasix, daily BMET - K+ is OK - BUN in line w/ previous values, Cr up slightly, follow   Otherwise, per IM. HR is controlled, will ck dig  level in am  Active Problems:   Essential hypertension   COPD with acute exacerbation   Dementia   UTI (lower urinary tract infection)   New onset a-fib   Signed, Barrett, Suanne Marker , PA-C 1:15 PM 01/28/2015  The patient was seen, examined and discussed with Rosaria Ferries, PA-C and I agree with the above.   81 year old male with newly dg a-fib, rate controlled, with acute on chronic systolic CHF, diuresed 12 lbs on iv lasix, we will continue iv lasix for 1 more day, today 176 lbs, baseline weight 170 lbs. crea is improving, we will follow. Switch to PO lasix tomorrow, anticipated discharge to SNF possibly in 1-2 days.  INR 2.2.  Dorothy Spark 01/28/2015

## 2015-01-28 NOTE — Progress Notes (Signed)
Utilization review completed.  

## 2015-01-28 NOTE — Clinical Social Work Placement (Signed)
   CLINICAL SOCIAL WORK PLACEMENT  NOTE  Date:  01/28/2015  Patient Details  Name: Jay Guzman MRN: [id number] Date of Birth: 07-Aug-1932  Clinical Social Work is seeking post-discharge placement for this patient at the [place] level of care (*CSW will initial, date and re-position this form in  chart as items are completed):  Yes   Patient/family provided with [facility] Work Department's list of facilities offering this level of care within the geographic area requested by the patient (or if unable, by the patient's family).  Yes   Patient/family informed of their freedom to choose among providers that offer the needed level of care, that participate in Medicare, Medicaid or managed care program needed by the patient, have an available bed and are willing to accept the patient.  Yes   Patient/family informed of [facility]'s ownership interest in [facility] and [facility], as well as of the fact that they are under no obligation to receive care at these facilities.  PASRR submitted to EDS on       PASRR number received on       Existing PASRR number confirmed on       FL2 transmitted to all facilities in geographic area requested by pt/family on       FL2 transmitted to all facilities within larger geographic area on       Patient informed that his/her managed care company has contracts with or will negotiate with certain facilities, including the following:   [facility])         Patient/family informed of bed offers received.  Patient chooses bed at       Physician recommends and patient chooses bed at      Patient to be transferred to   on  .  Patient to be transferred to facility by       Patient family notified on   of transfer.  Name of family member notified:        PHYSICIAN Please prepare priority discharge summary, including medications, Please sign FL2, Please prepare prescriptions      Additional Comment:    _______________________________________________ Williemae Area, LCSW 01/28/2015, 4:13 PM

## 2015-01-28 NOTE — Progress Notes (Signed)
ANTICOAGULATION CONSULT NOTE - Follow Up Consult  Pharmacy Consult for Warfarin Indication: atrial fibrillation and hx mural thrombus  Allergies  Allergen Reactions  . Codeine Other (See Comments)    Reaction during surgical  Procedure-unknown  . Morphine And Related Other (See Comments)    Reaction unknown-during surgical procedure    Labs:  Recent Labs  01/26/15 0547 01/27/15 0317 01/28/15 0307  LABPROT 22.1* 23.8* 24.4*  INR 1.94* 2.15* 2.22*  CREATININE 1.24  --   --     Estimated Creatinine Clearance: 48.3 mL/min (by C-G formula based on Cr of 1.24).   Assessment: 81 y/o male who presents with worsening confusion and 10-pound weight gain, admitted for acute-on-chronic CHF. He takes chronic warfarin for hx mural thrombus but also has new onset Afib. INR therapeutic at 2.22. CBC stable, no s/s of bleed  PTA coumadin dose: 6 mg daily  Goal of Therapy:  INR 2-3 Monitor platelets by anticoagulation protocol: Yes   Plan:  Give Coumadin 6 mg po x 1 today Monitor daily INR, CBC, s/s of bleed  Elenor Quinones, PharmD Clinical Pharmacist Pager [phone] 01/28/2015 9:59 AM

## 2015-01-28 NOTE — Progress Notes (Signed)
Patient Demographics:    Jay Guzman, is a 81 y.o. male, DOB - 02/01/1933, BLT:[id number]  Admit date - 01/23/2015   Admitting Physician Etta Quill, DO  Outpatient Primary MD for the patient is Odette Fraction, MD  LOS - 5   Chief Complaint  Patient presents with  . Weight Gain  . Altered Mental Status      Summary  This is a pleasant 81 year old Caucasian gentleman who lives at home with his wife and 2 sons with history of mild underlying dementia, chronic systolic heart failure, CAD, was admitted to the hospital with chief complaints of shortness of breath along with mild delirium due to acute on chronic systolic heart failure with acute renal failure.  He's been seen by cardiology, being diuresed, shortness of breath and renal function are improving. He also went into new onset A. fib with RVR and has been placed on beta blocker along with digoxin. His delirium is stable. He wants to be discharged home once he is stable home health PT has been ordered. Likely 1-2 more days of diuresis in the hospital and then discharged home.    Subjective:    Jay Guzman today has, No headache, No chest pain, No abdominal pain - No Nausea, No new weakness tingling or numbness, No Cough - improved SOB.     Assessment  & Plan :     1. Acute on chronic hypoxic respiratory failure secondary to acute on chronic systolic heart failure. Last EF 45% on echogram done in April of this ear. He has had 10 pound weight gain, had orthopnea, has been placed on IV Lasix and will add a dose of Zaroxolyn today as he still appears wet, continue salt and fluid restriction, continue beta blocker.  ACE inhibitor skipped due to ARF, we will request PCP and primary cardiologist to continue to monitor BMP and  electrolytes, if renal function stays stable low-dose ACE inhibitor can be added in the outpatient setting. Cardiology following.   Continue supportive care with oxygen and nebulizer treatments as needed.  Likely diuresis one to 2 more days.  Filed Weights   01/26/15 0443 01/27/15 0622 01/28/15 0639  Weight: 81.511 kg (179 lb 11.2 oz) 81.058 kg (178 lb 11.2 oz) 79.878 kg (176 lb 1.6 oz)     2. ARF. Baseline creatinine is close to 1, resolving with holding ACE. We will monitor.   3. Dementia. At risk for delirium. Mild intermittent delirium due to hospitalization and unfamiliar surroundings, Continue home medications. Minimize benzodiazepine and narcotic use.    4. UTI. Treated with Rocephin.   5. Essential hypertension. Continue beta blocker and diuretic, stable.   6. History of mural thrombus, New onset atrial fibrillation likely paroxysmal found in the ER. [place] VASC score greater than 3. On Coumadin and beta blocker continue, had to place him on low-dose digoxin due to RVR and borderline blood pressures, cardiology on board. Pharmacy monitoring Coumadin. Goal will be rate control. Stable digoxin level.   7. Dyslipidemia. Continue home dose statin.   8. History of COPD. At baseline. Minimal wheezing likely due to CHF. Continue supportive care with the mobilizer treatments and oxygen and monitor. He is on home oxygen.    Code Status : Full  Family Communication  : None present  Disposition Plan  : Home health PT ordered, discharged likely in 1-2 more days  Consults  :   Cards  Procedures  :    Echo - 08-2014  Mild LVH with LVEF approximately 45%, akinesis noted of the midto distal anteroseptal, anterior, and inferior walls as well asapex consistent with ischemic cardiomyopathy. History of chronicLV mural thrombus described - not well visualized with this study (could consider microbubble contrast study if further assessmentneeded). Grade 1 diastolic dysfunction. MAC  with mild mitralregurgitation. Sclerotic aortic valve with mild aorticregurgitation. Mild tricuspid regurgitation with PASP 53 mmHg.  DVT Prophylaxis  :  Coumadin  Lab Results  Component Value Date   INR 2.22* 01/28/2015   INR 2.15* 01/27/2015   INR 1.94* 01/26/2015     Lab Results  Component Value Date   PLT 191 01/23/2015    Inpatient Medications  Scheduled Meds: . asenapine  10 mg Sublingual BID  . cefTRIAXone (ROCEPHIN)  IV  1 g Intravenous Q24H  . digoxin  0.25 mg Oral Daily  . donepezil  5 mg Oral QHS  . doxazosin  2 mg Oral Daily  . ezetimibe-simvastatin  1 tablet Oral QHS  . furosemide  40 mg Intravenous BID  . metolazone  2.5 mg Oral Once  . metoprolol succinate  50 mg Oral Daily  . mirtazapine  30 mg Oral QHS  . Tiotropium Bromide-Olodaterol  2 puff Inhalation Daily  . traZODone  50 mg Oral QHS  . warfarin  6 mg Oral ONCE-1800  . Warfarin - Pharmacist Dosing Inpatient   Does not apply q1800   Continuous Infusions:  PRN Meds:.acetaminophen, albuterol, metoprolol, ondansetron (ZOFRAN) IV  Antibiotics  :     Anti-infectives    Start     Dose/Rate Route Frequency Ordered Stop   01/23/15 2330  cefTRIAXone (ROCEPHIN) 1 g in dextrose 5 % 50 mL IVPB     1 g 100 mL/hr over 30 Minutes Intravenous Every 24 hours 01/23/15 2319     01/23/15 2300  vancomycin (VANCOCIN) 1,250 mg in sodium chloride 0.9 % 250 mL IVPB  Status:  Discontinued     1,250 mg 166.7 mL/hr over 90 Minutes Intravenous  Once 01/23/15 2236 01/23/15 2313   01/23/15 2245  piperacillin-tazobactam (ZOSYN) IVPB 3.375 g  Status:  Discontinued     3.375 g 100 mL/hr over 30 Minutes Intravenous  Once 01/23/15 2236 01/23/15 2313        Objective:   Filed Vitals:   01/27/15 0920 01/27/15 1130 01/27/15 2037 01/28/15 0639  BP: 104/64 106/68 107/59 116/59  Pulse: 85 85 63 83  Temp:  97.8 F (36.6 C) 97.5 F (36.4 C) 98.3 F (36.8 C)  TempSrc:  Oral Oral Oral  Resp: 20 18 18 20   Height:        Weight:    79.878 kg (176 lb 1.6 oz)  SpO2: 95% 98% 96% 97%    Wt Readings from Last 3 Encounters:  01/28/15 79.878 kg (176 lb 1.6 oz)  01/10/15 78.019 kg (172 lb)  12/25/14 88.451 kg (195 lb)     Intake/Output Summary (Last 24 hours) at 01/28/15 1011 Last data filed at 01/28/15 0949  Gross per 24 hour  Intake    530 ml  Output   3350 ml  Net  -2820 ml     Physical Exam  Awake Alert, Oriented X 2, No new F.N deficits, Normal affect [place].AT,[name] Supple Neck,No JVD, No cervical lymphadenopathy  appriciated.  Symmetrical Chest wall movement, Good air movement bilaterally, basilar rales and wheezes RRR,No Gallops,Rubs or new Murmurs, No Parasternal Heave +ve B.Sounds, Abd Soft, No tenderness, No organomegaly appriciated, No rebound - guarding or rigidity. No Cyanosis, Clubbing , 1+ edema, No new Rash or bruise       Data Review:   Micro Results No results found for this or any previous visit (from the past 240 hour(s)).  Radiology Reports Dg Chest 2 View  01/23/2015   CLINICAL DATA:  Possibly urinary tract infection with confusion  EXAM: CHEST  2 VIEW  COMPARISON:  12/25/2014  FINDINGS: COPD with cardiac enlargement. These findings are stable. Vascular pattern normal. Bilateral lower lobe interstitial infiltrates new from prior study. Small bilateral effusions.  IMPRESSION: Bilateral lower lobe interstitial infiltrates right greater than left concerning for pneumonia.Mild pulmonary edema considered less likely.   Electronically Signed   By: Skipper Cliche M.D.   On: 01/23/2015 19:20     CBC  Recent Labs Lab 01/23/15 1943  WBC 9.9  HGB 14.3  HCT 44.5  PLT 191  MCV 102.5*  MCH 32.9  MCHC 32.1  RDW 15.3    Chemistries   Recent Labs Lab 01/23/15 1943 01/24/15 0507 01/25/15 0411 01/26/15 0547  NA 139 142 144 144  K 4.4 4.0 4.3 4.5  CL 103 103 104 103  CO2 29 32 34* 35*  GLUCOSE 101* 93 107* 112*  BUN 33* 31* 31* 28*  CREATININE 1.41* 1.46* 1.45* 1.24   CALCIUM 8.0* 8.0* 8.0* 8.4*  MG  --   --   --  2.3   ------------------------------------------------------------------------------------------------------------------ estimated creatinine clearance is 48.3 mL/min (by C-G formula based on Cr of 1.24). ------------------------------------------------------------------------------------------------------------------ No results for input(s): HGBA1C in the last 72 hours. ------------------------------------------------------------------------------------------------------------------ No results for input(s): CHOL, HDL, LDLCALC, TRIG, CHOLHDL, LDLDIRECT in the last 72 hours. ------------------------------------------------------------------------------------------------------------------ No results for input(s): TSH, T4TOTAL, T3FREE, THYROIDAB in the last 72 hours.  Invalid input(s): FREET3 ------------------------------------------------------------------------------------------------------------------ No results for input(s): VITAMINB12, FOLATE, FERRITIN, TIBC, IRON, RETICCTPCT in the last 72 hours.  Coagulation profile  Recent Labs Lab 01/24/15 0507 01/25/15 0411 01/26/15 0547 01/27/15 0317 01/28/15 0307  INR 1.36 1.66* 1.94* 2.15* 2.22*    No results for input(s): DDIMER in the last 72 hours.  Cardiac Enzymes No results for input(s): CKMB, TROPONINI, MYOGLOBIN in the last 168 hours.  Invalid input(s): CK ------------------------------------------------------------------------------------------------------------------ Invalid input(s): POCBNP   Time Spent in minutes 35   Lala Lund K M.D on 01/28/2015 at 10:11 AM  Between 7am to 7pm - Pager - [phone]  After 7pm go to [email or web address] - password [facility]  [facility] -  Office  [phone]

## 2015-01-28 NOTE — Clinical Social Work Note (Signed)
Clinical Social Work Assessment  Patient Details  Name: Jay Guzman MRN: [id number] Date of Birth: 1933/05/15  Date of referral:  01/28/15               Reason for consult:  Facility Placement                Permission sought to share information with:    Permission granted to share information::  Yes, Verbal Permission Granted (Given by son- patient is oriented to person only)  Name::        Agency::     Relationship::     Contact Information:     Housing/Transportation Living arrangements for the past 2 months:  Single Family Home Source of Information:  Adult Children, Other (Comment Required) (Neice- Shane  [phone]) Patient Interpreter Needed:  None Criminal Activity/Legal Involvement Pertinent to Current Situation/Hospitalization:  No - Comment as needed Significant Relationships:  Adult Children, Spouse, Other Family Members Lives with:  Spouse, Adult Children Do you feel safe going back to the place where you live?  No Need for family participation in patient care:  Yes (Comment)  Care giving concerns:  Patient lives with wife who has reported dementia. Patient is oriented to self only. Son [name] lives with parents but is reported of bad health. Niece reports recent hospitalization at [facility].    Social Worker assessment / plan: CSW met with patient and his niece Audelia Acton to discuss PT's recommendation SNF placement.  Niece indicates that there is a need for long term care placement as his wife has dementia and sons are unable to provide care home at home.  CSW spoke via telephone with son Tharon Aquas who gave verbal permission for Audelia Acton to be involved in d/c process and decision making. Bed search process discussed and will be initiated. Fl2 will be placed on chart for MD's signature and active bed search will be initiated.    Employment status:  Retired   Nurse, adult PT Recommendations:  [place] /  Referral to community resources:  [place]  Patient/Family's Response to care:  Niece and son are accepting of need for SNF placement for probable long ter care needs. Concerns noted about lack of Health Care or Financial Power of [place].    Patient/Family's Understanding of and Emotional Response to Diagnosis, Current Treatment, and Prognosis:  Patient is alert but disoriented all spheres. Niece verbalizes concerns about current home situation with wife and sons; does not feel that patient would be safe to return home and is very worried about this. She is extremely anxious to insure that patient is placed long term.    Emotional Assessment Appearance:  Appears stated age Attitude/Demeanor/Rapport:    Affect (typically observed):  Unable to Assess Orientation:  Oriented to Self Alcohol / Substance use:  Tobacco Use Psych involvement (Current and /or in the community):  No (Comment)  Discharge Needs  Concerns to be addressed:  Discharge Planning Concerns, Care Coordination Readmission within the last 30 days:  No Current discharge risk:  Dependent with Mobility, Cognitively Impaired Barriers to Discharge:  Continued Medical Work up, [employer]   Estill Bakes 01/28/2015, 4:02 PM

## 2015-01-29 ENCOUNTER — Other Ambulatory Visit: Payer: Self-pay | Admitting: Physician Assistant

## 2015-01-29 DIAGNOSIS — J9620 Acute and chronic respiratory failure, unspecified whether with hypoxia or hypercapnia: Secondary | ICD-10-CM

## 2015-01-29 DIAGNOSIS — I480 Paroxysmal atrial fibrillation: Secondary | ICD-10-CM

## 2015-01-29 DIAGNOSIS — N183 Chronic kidney disease, stage 3 unspecified: Secondary | ICD-10-CM

## 2015-01-29 DIAGNOSIS — I4819 Other persistent atrial fibrillation: Secondary | ICD-10-CM

## 2015-01-29 DIAGNOSIS — J9621 Acute and chronic respiratory failure with hypoxia: Secondary | ICD-10-CM

## 2015-01-29 DIAGNOSIS — I5043 Acute on chronic combined systolic (congestive) and diastolic (congestive) heart failure: Secondary | ICD-10-CM

## 2015-01-29 DIAGNOSIS — I5023 Acute on chronic systolic (congestive) heart failure: Secondary | ICD-10-CM

## 2015-01-29 LAB — BASIC METABOLIC PANEL
Anion gap: 7 (ref 5–15)
BUN: 38 mg/dL — AB (ref 6–20)
CALCIUM: 8.6 mg/dL — AB (ref 8.9–10.3)
CO2: 39 mmol/L — ABNORMAL HIGH (ref 22–32)
CREATININE: 1.38 mg/dL — AB (ref 0.61–1.24)
Chloride: 93 mmol/L — ABNORMAL LOW (ref 101–111)
GFR calc Af Amer: 55 mL/min — ABNORMAL LOW (ref >60)
GFR, EST NON AFRICAN AMERICAN: 47 mL/min — AB (ref >60)
Glucose, Bld: 105 mg/dL — ABNORMAL HIGH (ref 65–99)
Potassium: 4.3 mmol/L (ref 3.5–5.1)
SODIUM: 139 mmol/L (ref 135–145)

## 2015-01-29 LAB — PROTIME-INR
INR: 2.16 — AB (ref 0.00–1.49)
PROTHROMBIN TIME: 23.9 s — AB (ref 11.6–15.2)

## 2015-01-29 LAB — CBC
HCT: 44.2 % (ref 39.0–52.0)
Hemoglobin: 14.2 g/dL (ref 13.0–17.0)
MCH: 32.6 pg (ref 26.0–34.0)
MCHC: 32.1 g/dL (ref 30.0–36.0)
MCV: 101.6 fL — ABNORMAL HIGH (ref 78.0–100.0)
PLATELETS: 184 10*3/uL (ref 150–400)
RBC: 4.35 MIL/uL (ref 4.22–5.81)
RDW: 15 % (ref 11.5–15.5)
WBC: 7.7 10*3/uL (ref 4.0–10.5)

## 2015-01-29 LAB — MAGNESIUM: Magnesium: 2.3 mg/dL (ref 1.7–2.4)

## 2015-01-29 LAB — DIGOXIN LEVEL: DIGOXIN LVL: 0.7 ng/mL — AB (ref 0.8–2.0)

## 2015-01-29 MED ORDER — WARFARIN SODIUM 7.5 MG PO TABS
7.5000 mg | ORAL_TABLET | Freq: Once | ORAL | Status: AC
  Administered 2015-01-29: 7.5 mg via ORAL
  Filled 2015-01-29: qty 1

## 2015-01-29 MED ORDER — FUROSEMIDE 40 MG PO TABS
40.0000 mg | ORAL_TABLET | Freq: Every day | ORAL | Status: DC
  Administered 2015-01-30: 40 mg via ORAL
  Filled 2015-01-29: qty 1

## 2015-01-29 NOTE — Discharge Instructions (Signed)
Limit salt intake. Limit fluid intake to <2 L per day  Please call cardiology office if weight increase by more than 3 lbs overnight or 5 lbs in a single week, we can potentially instruct you over the phone to take additional dose of lasix for that day.

## 2015-01-29 NOTE — Clinical Social Work Placement (Signed)
   CLINICAL SOCIAL WORK PLACEMENT  NOTE  Date:  01/29/2015  Patient Details  Name: Jay Guzman MRN: [id number] Date of Birth: 1932-06-04  Clinical Social Work is seeking post-discharge placement for this patient at the [place] level of care (*CSW will initial, date and re-position this form in  chart as items are completed):  Yes   Patient/family provided with [facility] Work Department's list of facilities offering this level of care within the geographic area requested by the patient (or if unable, by the patient's family).  Yes   Patient/family informed of their freedom to choose among providers that offer the needed level of care, that participate in Medicare, Medicaid or managed care program needed by the patient, have an available bed and are willing to accept the patient.  Yes   Patient/family informed of [facility]'s ownership interest in [facility] and [facility], as well as of the fact that they are under no obligation to receive care at these facilities.  PASRR submitted to EDS on       PASRR number received on       Existing PASRR number confirmed on       FL2 transmitted to all facilities in geographic area requested by pt/family on       FL2 transmitted to all facilities within larger geographic area on       Patient informed that his/her managed care company has contracts with or will negotiate with certain facilities, including the following:   [facility])     Yes   Patient/family informed of bed offers received.  Patient chooses bed at [place] recommends and patient chooses bed at      Patient to be transferred to   on  .  Patient to be transferred to facility by       Patient family notified on   of transfer.  Name of family member notified:        PHYSICIAN Please prepare priority discharge summary, including medications, Please sign FL2, Please  prepare prescriptions     Additional Comment:    [place], [place]

## 2015-01-29 NOTE — Progress Notes (Signed)
ANTICOAGULATION CONSULT NOTE - Follow Up Consult  Pharmacy Consult for Coumadin Indication: atrial fibrillation and hx mural thrombus  Allergies  Allergen Reactions  . Codeine Other (See Comments)    Reaction during surgical  Procedure-unknown  . Morphine And Related Other (See Comments)    Reaction unknown-during surgical procedure    Patient Measurements: Height: 5\' 9"  (175.3 cm) Weight: 169 lb 8 oz (76.885 kg) IBW/kg (Calculated) : 70.7 Heparin Dosing Weight:   Vital Signs: Temp: 97.3 F (36.3 C) (09/14 0808) Temp Source: Oral (09/14 0808) BP: 106/69 mmHg (09/14 1010) Pulse Rate: 82 (09/14 1010)  Labs:  Recent Labs  01/27/15 0317 01/28/15 0307 01/29/15 0308  HGB  --   --  14.2  HCT  --   --  44.2  PLT  --   --  184  LABPROT 23.8* 24.4* 23.9*  INR 2.15* 2.22* 2.16*  CREATININE  --   --  1.38*    Estimated Creatinine Clearance: 43.4 mL/min (by C-G formula based on Cr of 1.38).   Medications:  Scheduled:  . asenapine  10 mg Sublingual BID  . digoxin  0.25 mg Oral Daily  . donepezil  5 mg Oral QHS  . doxazosin  2 mg Oral Daily  . ezetimibe-simvastatin  1 tablet Oral QHS  . furosemide  40 mg Intravenous BID  . metoprolol succinate  50 mg Oral Daily  . mirtazapine  30 mg Oral QHS  . Tiotropium Bromide-Olodaterol  2 puff Inhalation Daily  . traZODone  50 mg Oral QHS  . Warfarin - Pharmacist Dosing Inpatient   Does not apply q1800    Assessment: 81yo male on Coumadin, admitted 9/9 with subtherapeutic INR on Coumadin 6mg  daily; his last dose was 9/7.  INR 2.16 this AM, stable.  Hg & pltc wnl.  No bleeding noted.  Will give an additional larger dose of Coumadin today.  Goal of Therapy:  INR 2-3 Monitor platelets by anticoagulation protocol: Yes   Plan:  Coumadin 7.5mg  today Continue daily INR Watch for s/s of bleeding  Gracy Bruins, PharmD [facility]

## 2015-01-29 NOTE — Plan of Care (Signed)
Problem: Phase II Progression Outcomes Goal: Pain controlled Outcome: Completed/Met Date Met:  01/29/15 Not complaining of pain

## 2015-01-29 NOTE — Progress Notes (Signed)
Patient Name: Jay Guzman Date of Encounter: 01/29/2015  Primary Cardiologist Dr. Martinique   Principal Problem:   Acute on chronic systolic CHF (congestive heart failure), NYHA class 4 Active Problems:   Essential hypertension   COPD with acute exacerbation   Dementia   UTI (lower urinary tract infection)   New onset a-fib    SUBJECTIVE  Denies any CP or SOB.  Currently on 2L [place], states he is on O2 at home. States he wants to get out of here.   CURRENT MEDS . asenapine  10 mg Sublingual BID  . digoxin  0.25 mg Oral Daily  . donepezil  5 mg Oral QHS  . doxazosin  2 mg Oral Daily  . ezetimibe-simvastatin  1 tablet Oral QHS  . furosemide  40 mg Intravenous BID  . metoprolol succinate  50 mg Oral Daily  . mirtazapine  30 mg Oral QHS  . Tiotropium Bromide-Olodaterol  2 puff Inhalation Daily  . traZODone  50 mg Oral QHS  . warfarin  7.5 mg Oral ONCE-1800  . Warfarin - Pharmacist Dosing Inpatient   Does not apply q1800    OBJECTIVE  Filed Vitals:   01/29/15 0422 01/29/15 0808 01/29/15 0852 01/29/15 1010  BP: 122/84 118/64  106/69  Pulse: 78 78  82  Temp: 97.9 F (36.6 C) 97.3 F (36.3 C)    TempSrc: Oral Oral    Resp: 22 22    Height:      Weight: 169 lb 8 oz (76.885 kg)     SpO2: 96% 96% 91%     Intake/Output Summary (Last 24 hours) at 01/29/15 1054 Last data filed at 01/29/15 1015  Gross per 24 hour  Intake    660 ml  Output   1650 ml  Net   -990 ml   Filed Weights   01/27/15 0622 01/28/15 0639 01/29/15 0422  Weight: 178 lb 11.2 oz (81.058 kg) 176 lb 1.6 oz (79.878 kg) 169 lb 8 oz (76.885 kg)    PHYSICAL EXAM  General: Pleasant, NAD. Neuro: Alert and oriented X 3. Moves all extremities spontaneously. Psych: Normal affect. HEENT:  Normal  Neck: Supple without bruits   Lungs:  Resp regular and unlabored. Poor resp effort, hard to appreciate breath sound. No obvious rale.  Heart: Irregular. no s3, s4, or murmurs. Abdomen: Soft, non-tender,  non-distended, BS + x 4.  Extremities: No clubbing, cyanosis. DP/PT/Radials 2+ and equal bilaterally. LE edema resolved.   Accessory Clinical Findings  Basic Metabolic Panel  Recent Labs  01/29/15 0308  NA 139  K 4.3  CL 93*  CO2 39*  GLUCOSE 105*  BUN 38*  CREATININE 1.38*  CALCIUM 8.6*  MG 2.3    TELE Off telemetry    ECG  No new EKG  Echocardiogram 08/27/2014  LV EF: 45% -  50%  ------------------------------------------------------------------- Indications:   CHF - 428.0.  ------------------------------------------------------------------- Study Conclusions  - Left ventricle: The cavity size was mildly dilated. Wall thickness was increased in a pattern of mild LVH. Systolic function was mildly reduced. The estimated ejection fraction was in the range of 45% to 50%. There is akinesis of the mid-apicalanteroseptal, anterior, inferior, and apical myocardium. Doppler parameters are consistent with abnormal left ventricular relaxation (grade 1 diastolic dysfunction). - Aortic valve: Mildly to moderately calcified annulus. Trileaflet; moderately calcified leaflets. There was mild regurgitation. - Mitral valve: Calcified annulus. There was mild regurgitation. - Left atrium: The atrium was moderately dilated. - Right atrium: The atrium  was mildly dilated. Central venous pressure (est): 8 mm Hg. - Tricuspid valve: There was mild regurgitation. - Pulmonary arteries: PA peak pressure: 53 mm Hg (S). - Pericardium, extracardiac: There was no pericardial effusion.  Impressions:  - Mild LVH with LVEF approximately 45%, akinesis noted of the mid to distal anteroseptal, anterior, and inferior walls as well as apex consistent with ischemic cardiomyopathy. History of chronic LV mural thrombus described - not well visualized with this study (could consider microbubble contrast study if further assessment needed). Grade 1 diastolic  dysfunction. MAC with mild mitral regurgitation. Sclerotic aortic valve with mild aortic regurgitation. Mild tricuspid regurgitation with PASP 53 mmHg.    Radiology/Studies  Dg Chest 2 View  01/23/2015   CLINICAL DATA:  Possibly urinary tract infection with confusion  EXAM: CHEST  2 VIEW  COMPARISON:  12/25/2014  FINDINGS: COPD with cardiac enlargement. These findings are stable. Vascular pattern normal. Bilateral lower lobe interstitial infiltrates new from prior study. Small bilateral effusions.  IMPRESSION: Bilateral lower lobe interstitial infiltrates right greater than left concerning for pneumonia.Mild pulmonary edema considered less likely.   Electronically Signed   By: Skipper Cliche M.D.   On: 01/23/2015 19:20    ASSESSMENT AND PLAN  81 y.o. male with a past medical history significant for dementia, COPD, hypertension, dyslipidemia and coronary artery disease with remote anterior MI here with new onset atrial fibrillation with RVR.  1. Newly diagnosed PAF   - continue metoprolol XL and digoxin. On Coumadin, INR now therapeutic. His INR was subtherapeutic on arrival.  - plan for outpatient DCCV after one month of therapeutic INR. Rate controlled, off telemetry. Need to recheck dig level in 1 week  - likely can be discharged to SNF today.  2. Acute on chronic systolic HF  - Weight on arrival 186 lbs, weight on followup in Jan 174 lbs, current weight 169 (not sure if it is accurate as his weight yesterday was 176 lbs)  - Cr trended up slightly this morning, breath sound difficult to appreciated on physical exam, however appears to be euvolemic. Will transition to PO lasix 40mg  daily, will need to take additional dose of lasix if weight increase by more than 3lbs overnight or 5 lbs in a single week. Obtain BMET in 1 week.     3. COPD  4. UTI  5. CAD with remote anterior MI  6. HTN 7. HLD 8. H/o chronic LV mural thrombus  Signed, Woodward Ku Pager: [phone]   The  patient was seen, examined and discussed with Almyra Deforest, PA-C and I agree with the above.   81 year old male with newly dg a-fib, rate controlled, with acute on chronic systolic CHF, diuresed 12 lbs on iv lasix, we will continue iv lasix for 1 more day, today 176 lbs, baseline weight 170 lbs, now 169, appears euvolemic. I would switch to oral lasix 40 mg PO daily , previously 20 mg po daily. Anticipated discharge to SNF possibly tomorrow, he could possibly live today if a placement is arranged. INR 2.1. Crea stable in 2.2-2.4 range.   Dorothy Spark 01/29/2015

## 2015-01-29 NOTE — Progress Notes (Signed)
PROGRESS NOTE  Jay Guzman VCB:[id number] DOB: 1932/07/01 DOA: 01/23/2015 PCP: Odette Fraction, MD Brief History This is a pleasant 81 year old Caucasian gentleman who lives at home with his wife and 2 sons with history of mild underlying dementia, chronic systolic heart failure, CAD, CKD, apical thrombus on warfarin, CAD was admitted to the hospital with chief complaints of shortness of breath along with mild delirium due to acute on chronic systolic heart failure with acute renal failure.  He's been seen by cardiology, being diuresed, shortness of breath and renal function are improving. He also went into new onset A. fib with RVR and has been placed on beta blocker along with digoxin. His delirium is stable. He wants to be discharged home once he is stable home health PT has been ordered.  Assessment/Plan: Acute on chronic respiratory failure -Secondary to decompensated CHF -Improved clinically after diuresis -Currently stable on nasal cannula--2L  Acute on chronic systolic and diastolic CHF -38/46/6599 echocardiogram shows EF 45-50 percent, grade 1 diastolic dysfunction, AK of mid-apicalanteroseptal, anterior, inferior, and apical myocardium, PAP 53 -01/10/2015 discharge weight--172 lbs; appears that dry weight approx 170-172 -cannot use ACE/ARB secondary to renal failure -appreciate cardiology followup -question accuracy of weights -neg 4.5L  CKD stage II-III -Baseline creatinine 1.1-1.4 -Monitor with diuresis  Acute encephalopathy in the setting of dementia -Secondary to decompensated CHF -will expect intermitten worsening during hospitalization -continue aricept  Paroxysmal atrial fibrillation -Continue metoprolol succinate and digoxin -01/29/2015 digoxin level 0.7 -Continue warfarin--INR 2.16 -CHADS-VASc= 5  Hypertension -Controlled  -continue Cardura, metoprolol succinate  Pyuria -had 5 days of ceftriaxone  History of chronic LV mural  thrombus -continue warfarin  Hyperlipidemia -continue Vytorin  COPD -continue tiotropium-Olodaterol -stable on 2L    Family Communication:   No family at beside Disposition Plan:   SNF 9/15 if cleared by cardiology       Procedures/Studies: Dg Chest 2 View  01/23/2015   CLINICAL DATA:  Possibly urinary tract infection with confusion  EXAM: CHEST  2 VIEW  COMPARISON:  12/25/2014  FINDINGS: COPD with cardiac enlargement. These findings are stable. Vascular pattern normal. Bilateral lower lobe interstitial infiltrates new from prior study. Small bilateral effusions.  IMPRESSION: Bilateral lower lobe interstitial infiltrates right greater than left concerning for pneumonia.Mild pulmonary edema considered less likely.   Electronically Signed   By: Skipper Cliche M.D.   On: 01/23/2015 19:20         Subjective: Patient denies fevers, chills, headache, chest pain, dyspnea, nausea, vomiting, diarrhea, abdominal pain, dysuria, hematuria   Objective: Filed Vitals:   01/29/15 0422 01/29/15 0808 01/29/15 0852 01/29/15 1010  BP: 122/84 118/64  106/69  Pulse: 78 78  82  Temp: 97.9 F (36.6 C) 97.3 F (36.3 C)    TempSrc: Oral Oral    Resp: 22 22    Height:      Weight: 76.885 kg (169 lb 8 oz)     SpO2: 96% 96% 91%     Intake/Output Summary (Last 24 hours) at 01/29/15 1120 Last data filed at 01/29/15 1015  Gross per 24 hour  Intake    660 ml  Output   1650 ml  Net   -990 ml   Weight change: -2.994 kg (-6 lb 9.6 oz) Exam:   General:  Pt is alert, follows commands appropriately, not in acute distress  HEENT: No icterus, No thrush, No neck mass, [place]/AT  Cardiovascular: RRR, S1/S2, no rubs, no gallops  Respiratory: Bibasilar crackles. No wheezing. Good air movement. Diminished breath and bilateral.   Abdomen: Soft/+BS, non tender, non distended, no guarding  Extremities: 1+ LE edema, No lymphangitis, No petechiae, No rashes, no synovitis  Data Reviewed: Basic  Metabolic Panel:  Recent Labs Lab 01/23/15 1943 01/24/15 0507 01/25/15 0411 01/26/15 0547 01/29/15 0308  NA 139 142 144 144 139  K 4.4 4.0 4.3 4.5 4.3  CL 103 103 104 103 93*  CO2 29 32 34* 35* 39*  GLUCOSE 101* 93 107* 112* 105*  BUN 33* 31* 31* 28* 38*  CREATININE 1.41* 1.46* 1.45* 1.24 1.38*  CALCIUM 8.0* 8.0* 8.0* 8.4* 8.6*  MG  --   --   --  2.3 2.3   Liver Function Tests: No results for input(s): AST, ALT, ALKPHOS, BILITOT, PROT, ALBUMIN in the last 168 hours. No results for input(s): LIPASE, AMYLASE in the last 168 hours. No results for input(s): AMMONIA in the last 168 hours. CBC:  Recent Labs Lab 01/23/15 1943 01/29/15 0308  WBC 9.9 7.7  HGB 14.3 14.2  HCT 44.5 44.2  MCV 102.5* 101.6*  PLT 191 184   Cardiac Enzymes: No results for input(s): CKTOTAL, CKMB, CKMBINDEX, TROPONINI in the last 168 hours. BNP: Invalid input(s): POCBNP CBG: No results for input(s): GLUCAP in the last 168 hours.  No results found for this or any previous visit (from the past 240 hour(s)).   Scheduled Meds: . asenapine  10 mg Sublingual BID  . digoxin  0.25 mg Oral Daily  . donepezil  5 mg Oral QHS  . doxazosin  2 mg Oral Daily  . ezetimibe-simvastatin  1 tablet Oral QHS  . [START ON 01/30/2015] furosemide  40 mg Oral Daily  . metoprolol succinate  50 mg Oral Daily  . mirtazapine  30 mg Oral QHS  . Tiotropium Bromide-Olodaterol  2 puff Inhalation Daily  . traZODone  50 mg Oral QHS  . warfarin  7.5 mg Oral ONCE-1800  . Warfarin - Pharmacist Dosing Inpatient   Does not apply q1800   Continuous Infusions:    Delesa Kawa, DO  [facility] Pager [phone]  If 7PM-7AM, please contact night-coverage [email or web address] Password [email or web address] 01/29/2015, 11:20 AM   LOS: 6 days

## 2015-01-30 DIAGNOSIS — I5043 Acute on chronic combined systolic (congestive) and diastolic (congestive) heart failure: Principal | ICD-10-CM

## 2015-01-30 LAB — BASIC METABOLIC PANEL
ANION GAP: 7 (ref 5–15)
BUN: 33 mg/dL — AB (ref 6–20)
CALCIUM: 8.6 mg/dL — AB (ref 8.9–10.3)
CO2: 39 mmol/L — AB (ref 22–32)
Chloride: 92 mmol/L — ABNORMAL LOW (ref 101–111)
Creatinine, Ser: 1.34 mg/dL — ABNORMAL HIGH (ref 0.61–1.24)
GFR calc Af Amer: 56 mL/min — ABNORMAL LOW (ref >60)
GFR calc non Af Amer: 49 mL/min — ABNORMAL LOW (ref >60)
GLUCOSE: 95 mg/dL (ref 65–99)
Potassium: 4.3 mmol/L (ref 3.5–5.1)
Sodium: 138 mmol/L (ref 135–145)

## 2015-01-30 LAB — PROTIME-INR
INR: 2.17 — AB (ref 0.00–1.49)
PROTHROMBIN TIME: 24 s — AB (ref 11.6–15.2)

## 2015-01-30 MED ORDER — DIGOXIN 250 MCG PO TABS: 0.2500 mg | ORAL_TABLET | Freq: Every day | ORAL | Status: DC

## 2015-01-30 MED ORDER — WARFARIN SODIUM 7.5 MG PO TABS: ORAL_TABLET | ORAL | Status: DC

## 2015-01-30 MED ORDER — WARFARIN SODIUM 6 MG PO TABS: ORAL_TABLET | ORAL | Status: DC

## 2015-01-30 MED ORDER — FUROSEMIDE 40 MG PO TABS: 40.0000 mg | ORAL_TABLET | Freq: Every day | ORAL | Status: DC

## 2015-01-30 NOTE — Progress Notes (Signed)
Physical Therapy Treatment Patient Details Name: Jay Guzman MRN: [id number] DOB: 21-Aug-1932 Today's Date: Feb 05, 2015    History of Present Illness 81 y.o. male with h/o dementia, presents to ED with son with worsening confusion over the past couple of days. He also has h/o systolic CHF, 10 lb weight gain     PT Comments    Pt with continued flat affect and confusion. Pt with maintained gait distance with increased tolerance for HEP today. Pt required assist for pericare and continues to need 24hr assist. Will follow  Follow Up Recommendations  SNF;Supervision/Assistance - 24 hour     Equipment Recommendations       Recommendations for Other Services       Precautions / Restrictions Precautions Precautions: Fall    Mobility  Bed Mobility               General bed mobility comments: EOB on arrival  Transfers Overall transfer level: Needs assistance   Transfers: Sit to/from Stand Sit to Stand: Supervision         General transfer comment: cues for hand placement and safety from bed and BSC  Ambulation/Gait Ambulation/Gait assistance: Min guard Ambulation Distance (Feet): 170 Feet Assistive device: Rolling walker (2 wheeled) Gait Pattern/deviations: Step-through pattern;Decreased stride length;Trunk flexed   Gait velocity interpretation: Below normal speed for age/gender General Gait Details: cues for posture, position in RW without rest break, directional cues    Stairs            Wheelchair Mobility    Modified Rankin (Stroke Patients Only)       Balance Overall balance assessment: Needs assistance   Sitting balance-Leahy Scale: Fair       Standing balance-Leahy Scale: Poor                      Cognition Arousal/Alertness: Awake/alert Behavior During Therapy: Flat affect Overall Cognitive Status: History of cognitive impairments - at baseline                      Exercises General Exercises - Lower  Extremity Long Arc Quad: AROM;Seated;Both;20 reps Hip Flexion/Marching: AROM;Seated;Both;20 reps    General Comments        Pertinent Vitals/Pain Pain Assessment: No/denies pain    Home Living                      Prior Function            PT Goals (current goals can now be found in the care plan section) Progress towards PT goals: Progressing toward goals    Frequency       PT Plan Current plan remains appropriate    Co-evaluation             End of Session   Activity Tolerance: Patient tolerated treatment well Patient left: in chair;with call bell/phone within reach;with chair alarm set     Time: 0998-3382 PT Time Calculation (min) (ACUTE ONLY): 22 min  Charges:  $Gait Training: 8-22 mins                    G Codes:      Melford Aase 2015/02/05, 11:06 AM Elwyn Reach, [place]

## 2015-01-30 NOTE — Clinical Social Work Placement (Signed)
   CLINICAL SOCIAL WORK PLACEMENT  NOTE  Date:  01/30/2015  Patient Details  Name: Jay Guzman MRN: [id number] Date of Birth: 10/14/1932  Clinical Social Work is seeking post-discharge placement for this patient at the [place] level of care (*CSW will initial, date and re-position this form in  chart as items are completed):  Yes   Patient/family provided with [facility] Work Department's list of facilities offering this level of care within the geographic area requested by the patient (or if unable, by the patient's family).  Yes   Patient/family informed of their freedom to choose among providers that offer the needed level of care, that participate in Medicare, Medicaid or managed care program needed by the patient, have an available bed and are willing to accept the patient.  Yes   Patient/family informed of [facility]'s ownership interest in [facility] and [facility], as well as of the fact that they are under no obligation to receive care at these facilities.  PASRR submitted to EDS on  01/29/15     PASRR number received on   01/30/15    Existing PASRR number confirmed on       FL2 transmitted to all facilities in geographic area requested by pt/family on  9/14/116     FL2 transmitted to all facilities within larger geographic area on       Patient informed that his/her managed care company has contracts with or will negotiate with certain facilities, including the following:   [facility])     Yes   Patient/family informed of bed offers received.  Patient chooses bed at [facility] recommends and patient chooses bed at      Patient to be transferred to [facility] on 01/30/15.  Patient to be transferred to facility by Ambulance [facility])     Patient family notified on 01/30/15 of transfer.  Name of family member notified:  Son: [name]     PHYSICIAN Please  prepare priority discharge summary, including medications, Please sign FL2, Please prepare prescriptions     Additional Comment:  Ok per MD for d/c today to SNF at [facility]. Notified patient's son Tharon Aquas who signed admission paperwork with the facility this morning.  Nursing notified to call report;  Patient is alert but confused as to situation and disposition. DC packet completed and EMS notified. DC summary sent to facility. No further CSW needs identified.  CSW signing off.  Lorie Phenix. Murrell Redden [phone]      _______________________________________________ Williemae Area, LCSW 01/30/2015, 5:57 PM

## 2015-01-30 NOTE — Progress Notes (Signed)
At 1400 pt inhaler  X 1 (Stiolto Respimat ) given to pt's son and pharmacy paper signed and a copy given by pt's,nurse Jiles Garter, Therapist, sports .  Chad Tiznado,RN.

## 2015-01-30 NOTE — Progress Notes (Signed)
Attempted to call report to RN at [facility].  Neoma Laming from [employer] answered and took a message.  Stated the RN is "busy and in an emergency right now".  Transport called for 1330.  Will attempt to call back if RN does not call me first.

## 2015-01-30 NOTE — Discharge Summary (Signed)
Physician Discharge Summary  Jay Guzman JTT:[id number] DOB: 1933/01/30 DOA: 01/23/2015  PCP: Odette Fraction, MD  Admit date: 01/23/2015 Discharge date: 01/30/2015  Recommendations for Outpatient Follow-up:  1. Pt will need to follow up with PCP in 2 weeks post discharge 2. Please obtain BMP on 02/03/15 3. Please keep pt on 2L nasal cannula 4. Check INR on 02/03/15 and adjust coumadin dosing accordingly 5. Check digoxin level on 02/03/15 and adjust dose accordingly Discharge Diagnoses:  Acute on chronic respiratory failure -Secondary to decompensated CHF -Improved clinically after diuresis -Currently stable on nasal cannula--2L -pt normally on 2L [place] at home  Acute on chronic systolic and diastolic CHF -00/92/3300 echocardiogram shows EF 45-50 percent, grade 1 diastolic dysfunction, AK of mid-apicalanteroseptal, anterior, inferior, and apical myocardium, PAP 53 -01/10/2015 discharge weight--172 lbs; appears that dry weight approx 170 -cannot use ACE/ARB secondary to renal failure -appreciate cardiology followup -question accuracy of weights -neg 5.1L -01/30/2015 discharge weight 168 pounds -Patient will be discharged with furosemide 40 mg daily  CKD stage II-III -Baseline creatinine 1.1-1.4 -Monitor with diuresis -Serum creatinine on the day of discharge 1.34  Acute encephalopathy in the setting of dementia -Secondary to decompensated CHF -will expect intermitten worsening during hospitalization -continue aricept -01/29/2015 --According to the patient's son at the bedside the patient is at his baseline  Paroxysmal atrial fibrillation -Continue metoprolol succinate and digoxin -01/29/2015 digoxin level 0.7 -Continue warfarin--INR 2.16 -CHADS-VASc= 5 -Discharge with warfarin 7.5 mg on Monday, Wednesday, Friday and 6 mg on all other days  Hypertension -Controlled  -continue Cardura, metoprolol succinate  Pyuria -had 5 days of ceftriaxone  History of chronic  LV mural thrombus -continue warfarin  Hyperlipidemia -continue Vytorin  COPD -continue tiotropium-Olodaterol -stable on 2L  Discharge Condition: stable  Disposition: SNF Follow-up Information    Follow up with [facility] On 02/05/2015.   Specialty:  Cardiology   Why:  obtain Labs include digoxin and BMET in 1 week after discharge during lab hour between 7-5pm.   Contact information:   [address] [place] [phone]      Follow up with Tarri Fuller, PA-C On 02/11/2015.   Specialties:  Physician Assistant, Radiology, Interventional Cardiology   Why:  11:30am   Contact information:   [address] [place] [address] [phone]       Follow up with Peter Martinique, MD On 04/09/2015.   Specialty:  Cardiology   Why:  3:45pm   Contact information:   [address] [place] [address] [phone]       Diet:heart healthy Wt Readings from Last 3 Encounters:  01/30/15 76.386 kg (168 lb 6.4 oz)  01/10/15 78.019 kg (172 lb)  12/25/14 88.451 kg (195 lb)    History of present illness:  81 year old Caucasian gentleman who lives at home with his wife and 2 sons with history of mild underlying dementia, chronic systolic heart failure, CAD, CKD, apical thrombus on warfarin, CAD was admitted to the hospital with chief complaints of shortness of breath along with mild delirium due to acute on chronic systolic heart failure with acute renal failure.  He's been seen by cardiology, being diuresed, shortness of breath and renal function are improving. He also went into new onset A. fib with RVR and has been placed on beta blocker along with digoxin. The patient was started on intravenous furosemide. He responded well clinically. The patient's renal function stabilized and remained stable on furosemide. He'll  be discharged to skilled nursing facility with furosemide 40 mg daily. His warfarin dose  was adjusted to maintain INR 2-3. Please follow recommendations as above on therapeutic monitoring. The patient's delirium improved. According to the patient's family, his mental status was at baseline at time of discharge.  Consultants: cardiology  Discharge Exam: Filed Vitals:   01/30/15 0605  BP: 110/67  Pulse: 81  Temp: 98.2 F (36.8 C)  Resp: 19   Filed Vitals:   01/29/15 1010 01/29/15 1219 01/29/15 2025 01/30/15 0605  BP: 106/69 107/73 114/70 110/67  Pulse: 82 99 74 81  Temp:   98 F (36.7 C) 98.2 F (36.8 C)  TempSrc:   Oral Oral  Resp:  22 20 19   Height:      Weight:    76.386 kg (168 lb 6.4 oz)  SpO2:  96% 96% 94%   General: Awake and alert, NAD, pleasant, cooperative Cardiovascular: RRR, no rub, no gallop, no S3 Respiratory: Diminished breath sounds bilaterally. Bibasilar crackles. No wheezing. Abdomen:soft, nontender, nondistended, positive bowel sounds Extremities: trace LE edema, No lymphangitis, no petechiae  Discharge Instructions      Discharge Instructions    Diet - low sodium heart healthy    Complete by:  As directed      Increase activity slowly    Complete by:  As directed             Medication List    STOP taking these medications        lisinopril 2.5 MG tablet  Commonly known as:  PRINIVIL,ZESTRIL      TAKE these medications        Asenapine Maleate 10 MG Subl  Place 10 mg under the tongue 2 (two) times daily.     digoxin 0.25 MG tablet  Commonly known as:  LANOXIN  Take 1 tablet (0.25 mg total) by mouth daily.     donepezil 5 MG tablet  Commonly known as:  ARICEPT  Take 5 mg by mouth at bedtime.     doxazosin 2 MG tablet  Commonly known as:  CARDURA  Take 1 tablet (2 mg total) by mouth daily.     ezetimibe-simvastatin 10-40 MG per tablet  Commonly known as:  VYTORIN  Take 1 tablet by mouth at bedtime.     furosemide 40 MG tablet  Commonly known as:  LASIX  Take 1 tablet (40 mg total) by mouth daily.      metoprolol succinate 50 MG 24 hr tablet  Commonly known as:  TOPROL-XL  Take 1 tablet by mouth  daily     mirtazapine 30 MG tablet  Commonly known as:  REMERON  Take 30 mg by mouth at bedtime.     nicotine 21 mg/24hr patch  Commonly known as:  NICODERM CQ - dosed in mg/24 hours  Place 21 mg onto the skin daily.     nitroGLYCERIN 0.4 MG SL tablet  Commonly known as:  NITROSTAT  Place 1 tablet (0.4 mg total) under the tongue every 5 (five) minutes as needed.     ondansetron 4 MG tablet  Commonly known as:  ZOFRAN  Take 4 mg by mouth every 8 (eight) hours as needed for nausea or vomiting.     PROAIR HFA 108 (90 BASE) MCG/ACT inhaler  Generic drug:  albuterol  INHALE 2 PUFFS INTO THE LUNGS EVERY 6 HOURS AS NEEDED FOR WHEEZING OR SHORTNESS OF BREATH.     Tiotropium Bromide-Olodaterol 2.5-2.5 MCG/ACT Aers  Commonly known  as:  STIOLTO RESPIMAT  Inhale 2 puffs into the lungs daily.     traZODone 50 MG tablet  Commonly known as:  DESYREL  Take 50 mg by mouth at bedtime.     warfarin 6 MG tablet  Commonly known as:  COUMADIN  Take 6 mg (one tablet) daily on Sunday, Tuesday, Thursday, Saturday     warfarin 7.5 MG tablet  Commonly known as:  COUMADIN  Take 7.5 mg (one tablet) on Monday, Wednesday, Friday         The results of significant diagnostics from this hospitalization (including imaging, microbiology, ancillary and laboratory) are listed below for reference.    Significant Diagnostic Studies: Dg Chest 2 View  01/23/2015   CLINICAL DATA:  Possibly urinary tract infection with confusion  EXAM: CHEST  2 VIEW  COMPARISON:  12/25/2014  FINDINGS: COPD with cardiac enlargement. These findings are stable. Vascular pattern normal. Bilateral lower lobe interstitial infiltrates new from prior study. Small bilateral effusions.  IMPRESSION: Bilateral lower lobe interstitial infiltrates right greater than left concerning for pneumonia.Mild pulmonary edema considered less likely.    Electronically Signed   By: Skipper Cliche M.D.   On: 01/23/2015 19:20     Microbiology: No results found for this or any previous visit (from the past 240 hour(s)).   Labs: Basic Metabolic Panel:  Recent Labs Lab 01/24/15 0507 01/25/15 0411 01/26/15 0547 01/29/15 0308 01/30/15 0327  NA 142 144 144 139 138  K 4.0 4.3 4.5 4.3 4.3  CL 103 104 103 93* 92*  CO2 32 34* 35* 39* 39*  GLUCOSE 93 107* 112* 105* 95  BUN 31* 31* 28* 38* 33*  CREATININE 1.46* 1.45* 1.24 1.38* 1.34*  CALCIUM 8.0* 8.0* 8.4* 8.6* 8.6*  MG  --   --  2.3 2.3  --    Liver Function Tests: No results for input(s): AST, ALT, ALKPHOS, BILITOT, PROT, ALBUMIN in the last 168 hours. No results for input(s): LIPASE, AMYLASE in the last 168 hours. No results for input(s): AMMONIA in the last 168 hours. CBC:  Recent Labs Lab 01/23/15 1943 01/29/15 0308  WBC 9.9 7.7  HGB 14.3 14.2  HCT 44.5 44.2  MCV 102.5* 101.6*  PLT 191 184   Cardiac Enzymes: No results for input(s): CKTOTAL, CKMB, CKMBINDEX, TROPONINI in the last 168 hours. BNP: Invalid input(s): POCBNP CBG: No results for input(s): GLUCAP in the last 168 hours.  Time coordinating discharge:  Greater than 30 minutes  Signed:  Rozena Fierro, DO [facility] Pager: [phone] 01/30/2015, 8:30 AM

## 2015-01-30 NOTE — Care Management Important Message (Signed)
Important Message  Patient Details  Name: Jay Guzman MRN: [id number] Date of Birth: 1932/11/22   Medicare Important Message Given:  Yes-third notification given    Delorse Lek 01/30/2015, 8:41 AM

## 2015-02-03 ENCOUNTER — Non-Acute Institutional Stay (SKILLED_NURSING_FACILITY): Payer: Medicare Other | Admitting: Internal Medicine

## 2015-02-03 DIAGNOSIS — I110 Hypertensive heart disease with heart failure: Secondary | ICD-10-CM

## 2015-02-03 DIAGNOSIS — I5043 Acute on chronic combined systolic (congestive) and diastolic (congestive) heart failure: Secondary | ICD-10-CM | POA: Diagnosis not present

## 2015-02-03 DIAGNOSIS — N390 Urinary tract infection, site not specified: Secondary | ICD-10-CM | POA: Diagnosis not present

## 2015-02-03 DIAGNOSIS — J449 Chronic obstructive pulmonary disease, unspecified: Secondary | ICD-10-CM | POA: Diagnosis not present

## 2015-02-03 DIAGNOSIS — F0390 Unspecified dementia without behavioral disturbance: Secondary | ICD-10-CM | POA: Diagnosis not present

## 2015-02-03 DIAGNOSIS — N183 Chronic kidney disease, stage 3 unspecified: Secondary | ICD-10-CM

## 2015-02-03 DIAGNOSIS — J9621 Acute and chronic respiratory failure with hypoxia: Secondary | ICD-10-CM

## 2015-02-03 DIAGNOSIS — E785 Hyperlipidemia, unspecified: Secondary | ICD-10-CM | POA: Diagnosis not present

## 2015-02-03 DIAGNOSIS — I480 Paroxysmal atrial fibrillation: Secondary | ICD-10-CM | POA: Diagnosis not present

## 2015-02-03 DIAGNOSIS — I513 Intracardiac thrombosis, not elsewhere classified: Secondary | ICD-10-CM

## 2015-02-03 DIAGNOSIS — I509 Heart failure, unspecified: Secondary | ICD-10-CM

## 2015-02-03 DIAGNOSIS — I213 ST elevation (STEMI) myocardial infarction of unspecified site: Secondary | ICD-10-CM | POA: Diagnosis not present

## 2015-02-03 NOTE — Progress Notes (Signed)
MRN: [id number] Name: Jay Guzman  Sex: male Age: 81 y.o. DOB: Jul 01, 1932  [place] #: Helene Kelp Facility/Room:123 Level Of Care: SNF Provider: Inocencio Homes D Emergency Contacts: Extended Emergency Contact Information Primary Emergency Contact: Collen, Vincent Address: [name], [place] [address] [place] Phone: [phone] Mobile Phone: [phone] Relation: Son Secondary Emergency Contact: [name] Address: [place], [place] Phone: [phone] Relation: Spouse  Code Status:   Allergies: Codeine and Morphine and related  Chief Complaint  Patient presents with  . New Admit To SNF    HPI: Patient is 81 y.o. male wholives at home with his wife and 2 sons with history of mild underlying dementia, chronic systolic heart failure, CAD, CKD, apical thrombus on warfarin, CAD was admitted to the hospital with chief complaints of shortness of breath along with mild delirium due to acute on chronic systolic heart failure with acute renal failure.He was admitted to hospital from 9/8-15 where his stay was complicated by new onset a fib with RVR which was controlled by bblocker and digoxin and acute onchronic renal failure that improved .While at SNF pt will be followed for HTN, tx with cardura and metoprolol, HLD tx with vytorin and COPD tx with 2L and stiolto.  Past Medical History  Diagnosis Date  . Coronary disease     WITH REMOTE ANTERIOR MYOCARDIAL INFARCTION  . Mural thrombus of heart     CHRONIC  . Hypertension   . Hyperlipidemia   . CHF (congestive heart failure)   . Dementia     Past Surgical History  Procedure Laterality Date  . Cardiac catheterization  2000  . Hernia repair      STATUS POST BILATERAL  . Abdominal aortic aneurysm repair  2008      Medication List       This list is accurate as of: 02/03/15 11:59 PM.  Always use your most  recent med list.               Asenapine Maleate 10 MG Subl  Place 10 mg under the tongue 2 (two) times daily.     digoxin 0.25 MG tablet  Commonly known as:  LANOXIN  Take 1 tablet (0.25 mg total) by mouth daily.     donepezil 5 MG tablet  Commonly known as:  ARICEPT  Take 5 mg by mouth at bedtime.     doxazosin 2 MG tablet  Commonly known as:  CARDURA  Take 1 tablet (2 mg total) by mouth daily.     ezetimibe-simvastatin 10-40 MG per tablet  Commonly known as:  VYTORIN  Take 1 tablet by mouth at bedtime.     furosemide 40 MG tablet  Commonly known as:  LASIX  Take 1 tablet (40 mg total) by mouth daily.     metoprolol succinate 50 MG 24 hr tablet  Commonly known as:  TOPROL-XL  Take 1 tablet by mouth  daily     mirtazapine 30 MG tablet  Commonly known as:  REMERON  Take 30 mg by mouth at bedtime.     nicotine 21 mg/24hr patch  Commonly known as:  NICODERM CQ - dosed in mg/24 hours  Place 21 mg onto the skin daily.     nitroGLYCERIN 0.4 MG SL tablet  Commonly known as:  NITROSTAT  Place 1 tablet (0.4  mg total) under the tongue every 5 (five) minutes as needed.     ondansetron 4 MG tablet  Commonly known as:  ZOFRAN  Take 4 mg by mouth every 8 (eight) hours as needed for nausea or vomiting.     PROAIR HFA 108 (90 BASE) MCG/ACT inhaler  Generic drug:  albuterol  INHALE 2 PUFFS INTO THE LUNGS EVERY 6 HOURS AS NEEDED FOR WHEEZING OR SHORTNESS OF BREATH.     Tiotropium Bromide-Olodaterol 2.5-2.5 MCG/ACT Aers  Commonly known as:  STIOLTO RESPIMAT  Inhale 2 puffs into the lungs daily.     traZODone 50 MG tablet  Commonly known as:  DESYREL  Take 50 mg by mouth at bedtime.     warfarin 6 MG tablet  Commonly known as:  COUMADIN  Take 6 mg (one tablet) daily on Sunday, Tuesday, Thursday, Saturday     warfarin 7.5 MG tablet  Commonly known as:  COUMADIN  Take 7.5 mg (one tablet) on Monday, Wednesday, Friday        No orders of the defined types were placed  in this encounter.    Immunization History  Administered Date(s) Administered  . Pneumococcal Polysaccharide-23 01/04/2014    Social History  Substance Use Topics  . Smoking status: Current Every Day Smoker -- 1.00 packs/day for 60 years    Types: Cigarettes  . Smokeless tobacco: Not on file  . Alcohol Use: No    Family history is + CA, cerebral aneurysm   Review of Systems  DATA OBTAINED: from patient, nurse, family member GENERAL:  no fevers, fatigue, appetite changes SKIN: No itching, rash or wounds EYES: No eye pain, redness, discharge EARS: No earache, tinnitus, change in hearing NOSE: No congestion, drainage or bleeding  MOUTH/THROAT: No mouth or tooth pain, No sore throat RESPIRATORY: No cough, wheezing, SOB CARDIAC: No chest pain, palpitations, lower extremity edema  GI: No abdominal pain, No N/V/D or constipation, No heartburn or reflux  GU: No dysuria, frequency or urgency, or incontinence  MUSCULOSKELETAL: No unrelieved bone/joint pain NEUROLOGIC: No headache, dizziness or focal weakness PSYCHIATRIC: No c/o anxiety or sadness   Filed Vitals:   02/06/15 1751  BP: 132/94  Pulse: 74  Temp: 97.2 F (36.2 C)  Resp: 16    SpO2 Readings from Last 1 Encounters:  01/30/15 97%        Physical Exam  GENERAL APPEARANCE: Alert,  No acute distress.  SKIN: No diaphoresis rash HEAD: Normocephalic, atraumatic  EYES: Conjunctiva/lids clear. Pupils round, reactive. EOMs intact.  EARS: External exam WNL, canals clear. Hearing grossly normal.  NOSE: No deformity or discharge.  MOUTH/THROAT: Lips w/o lesions  RESPIRATORY: Breathing is even, unlabored. Lung sounds are diffusely decreased   CARDIOVASCULAR: Heart RRR no murmurs, rubs or gallops. No peripheral edema.   GASTROINTESTINAL: Abdomen is soft, non-tender, not distended w/ normal bowel sounds. GENITOURINARY: Bladder non tender, not distended  MUSCULOSKELETAL: No abnormal joints or musculature NEUROLOGIC:   Cranial nerves 2-12 grossly intact. Moves all extremities  PSYCHIATRIC: dementia and confusion, speaking but not making sense;no behavioral issues  Patient Active Problem List   Diagnosis Date Noted  . CKD (chronic kidney disease), stage III 01/29/2015  . Paroxysmal atrial fibrillation 01/29/2015  . Acute on chronic combined systolic and diastolic CHF (congestive heart failure) 01/29/2015  . Acute on chronic respiratory failure 01/29/2015  . Acute on chronic systolic congestive heart failure   . Acute on chronic systolic CHF (congestive heart failure), NYHA class 4 01/23/2015  . UTI (  lower urinary tract infection) 01/23/2015  . New onset a-fib 01/23/2015  . Dementia   . Acute respiratory failure with hypoxia 08/28/2014  . Macrocytosis without anemia 08/27/2014  . COPD exacerbation 08/26/2014  . COPD (chronic obstructive pulmonary disease) 08/26/2014  . COPD with acute exacerbation 08/26/2014  . CAP (community acquired pneumonia) 08/26/2014  . Encounter for therapeutic drug monitoring 08/14/2013  . Long term current use of anticoagulant therapy 01/08/2011  . Tobacco abuse 08/12/2010  . Coronary disease   . Mural thrombus of heart   . Hypertensive heart disease with CHF   . Hyperlipidemia   . Chronic systolic CHF (congestive heart failure), NYHA class 2     CBC    Component Value Date/Time   WBC 7.7 01/29/2015 0308   RBC 4.35 01/29/2015 0308   HGB 14.2 01/29/2015 0308   HCT 44.2 01/29/2015 0308   HCT 44.6 08/27/2014 1403   PLT 184 01/29/2015 0308   MCV 101.6* 01/29/2015 0308   LYMPHSABS 1.1 01/10/2015 1014   MONOABS 0.6 01/10/2015 1014   EOSABS 0.2 01/10/2015 1014   BASOSABS 0.0 01/10/2015 1014    CMP     Component Value Date/Time   NA 138 01/30/2015 0327   K 4.3 01/30/2015 0327   CL 92* 01/30/2015 0327   CO2 39* 01/30/2015 0327   GLUCOSE 95 01/30/2015 0327   BUN 33* 01/30/2015 0327   CREATININE 1.34* 01/30/2015 0327   CREATININE 1.11 01/10/2015 1014   CALCIUM  8.6* 01/30/2015 0327   PROT 4.9* 01/10/2015 1014   ALBUMIN 3.3* 01/10/2015 1014   AST 33 01/10/2015 1014   ALT 49* 01/10/2015 1014   ALKPHOS 47 01/10/2015 1014   BILITOT 1.2 01/10/2015 1014   GFRNONAA 49* 01/30/2015 0327   GFRNONAA 63 01/10/2015 1014   GFRAA 56* 01/30/2015 0327   GFRAA 73 01/10/2015 1014    No results found for: HGBA1C   Dg Chest 2 View  01/23/2015   CLINICAL DATA:  Possibly urinary tract infection with confusion  EXAM: CHEST  2 VIEW  COMPARISON:  12/25/2014  FINDINGS: COPD with cardiac enlargement. These findings are stable. Vascular pattern normal. Bilateral lower lobe interstitial infiltrates new from prior study. Small bilateral effusions.  IMPRESSION: Bilateral lower lobe interstitial infiltrates right greater than left concerning for pneumonia.Mild pulmonary edema considered less likely.   Electronically Signed   By: Skipper Cliche M.D.   On: 01/23/2015 19:20    Not all labs, radiology exams or other studies done during hospitalization come through on my EPIC note; however they are reviewed by me.    Assessment and Plan  Acute on chronic respiratory failure Secondary to decompensated CHF -Improved clinically after diuresis -Currently stable on nasal cannula--2L -pt normally on 2L [place] at home SNF - monitor, cont 2L [place]  Acute on chronic combined systolic and diastolic CHF (congestive heart failure) 08/27/2014 echocardiogram shows EF 45-50 percent, grade 1 diastolic dysfunction, AK of mid-apicalanteroseptal, anterior, inferior, and apical myocardium, PAP 53 -01/10/2015 discharge weight--172 lbs; appears that dry weight approx 170 -cannot use ACE/ARB secondary to renal failure -appreciate cardiology followup -question accuracy of weights -neg 5.1L -01/30/2015 discharge weight 168 pounds SNF-  furosemide 40 mg daily and metoprolol  CKD (chronic kidney disease), stage III Baseline creatinine 1.1-1.4 -Monitor with diuresis -Serum creatinine on the day of  discharge 1.34 SNF - BMP to monitor  Dementia Secondary to decompensated CHF -will expect intermitten worsening during hospitalization  ;According to the patient's son at the bedside the patient is  at his baseline SNF - cont aricept; son has asked to decrease or stop meds that might make pt sleepy so decreasing trazodone to 25 mg qHS  Paroxysmal atrial fibrillation Continue metoprolol succinate and digoxin -01/29/2015 digoxin level 0.7 -Continue warfarin--INR 2.16 -CHADS-VASc= 5 SNF - Cont  warfarin 7.5 mg on Monday, Wednesday, Friday and 6 mg on all other days; will be actively titrating 2/2 new situation, change in diet   Hypertensive heart disease with CHF Controlled; SNF - continue Cardura, metoprolol succinate  UTI (lower urinary tract infection) had 5 days of ceftriaxone   Hyperlipidemia SNF - cont vytorin  COPD (chronic obstructive pulmonary disease) Stable - SNF - conr O2 and stiolto with prns  Mural thrombus of heart Chronic and stable; SNF - cont warfarin and actively titrate   Time spent > 45 min; > 50% of time with patient was spent reviewing records, labs, tests and studies, counseling and developing plan of care  Hennie Duos, MD

## 2015-02-05 ENCOUNTER — Other Ambulatory Visit: Payer: Medicare Other

## 2015-02-05 ENCOUNTER — Other Ambulatory Visit: Payer: Self-pay | Admitting: Family Medicine

## 2015-02-05 NOTE — Telephone Encounter (Signed)
?   OK to Refill  

## 2015-02-06 ENCOUNTER — Encounter: Payer: Self-pay | Admitting: Internal Medicine

## 2015-02-06 NOTE — Assessment & Plan Note (Signed)
Continue metoprolol succinate and digoxin -01/29/2015 digoxin level 0.7 -Continue warfarin--INR 2.16 -CHADS-VASc= 5 SNF - Cont  warfarin 7.5 mg on Monday, Wednesday, Friday and 6 mg on all other days; will be actively titrating 2/2 new situation, change in diet

## 2015-02-06 NOTE — Telephone Encounter (Signed)
Ok with refill but was dc to snf.

## 2015-02-06 NOTE — Assessment & Plan Note (Signed)
Baseline creatinine 1.1-1.4 -Monitor with diuresis -Serum creatinine on the day of discharge 1.34 SNF - BMP to monitor

## 2015-02-06 NOTE — Assessment & Plan Note (Signed)
Secondary to decompensated CHF -will expect intermitten worsening during hospitalization  ;According to the patient's son at the bedside the patient is at his baseline SNF - cont aricept; son has asked to decrease or stop meds that might make pt sleepy so decreasing trazodone to 25 mg qHS

## 2015-02-06 NOTE — Assessment & Plan Note (Signed)
Stable - SNF - conr O2 and stiolto with prns

## 2015-02-06 NOTE — Assessment & Plan Note (Signed)
Controlled; SNF - continue Cardura, metoprolol succinate

## 2015-02-06 NOTE — Assessment & Plan Note (Signed)
Chronic and stable; SNF - cont warfarin and actively titrate

## 2015-02-06 NOTE — Assessment & Plan Note (Signed)
had 5 days of ceftriaxone

## 2015-02-06 NOTE — Assessment & Plan Note (Signed)
SNF - cont vytorin

## 2015-02-06 NOTE — Assessment & Plan Note (Signed)
Secondary to decompensated CHF -Improved clinically after diuresis -Currently stable on nasal cannula--2L -pt normally on 2L [place] at home SNF - monitor, cont 2L [place]

## 2015-02-06 NOTE — Assessment & Plan Note (Signed)
08/27/2014 echocardiogram shows EF 45-50 percent, grade 1 diastolic dysfunction, AK of mid-apicalanteroseptal, anterior, inferior, and apical myocardium, PAP 53 -01/10/2015 discharge weight--172 lbs; appears that dry weight approx 170 -cannot use ACE/ARB secondary to renal failure -appreciate cardiology followup -question accuracy of weights -neg 5.1L -01/30/2015 discharge weight 168 pounds SNF-  furosemide 40 mg daily and metoprolol

## 2015-02-07 ENCOUNTER — Ambulatory Visit: Payer: Medicare Other | Admitting: Family Medicine

## 2015-02-07 NOTE — Telephone Encounter (Signed)
meds denied.  Pt now in SNF who will supply all medications

## 2015-02-11 ENCOUNTER — Ambulatory Visit (INDEPENDENT_AMBULATORY_CARE_PROVIDER_SITE_OTHER): Payer: Medicare Other | Admitting: Physician Assistant

## 2015-02-11 ENCOUNTER — Ambulatory Visit: Payer: Medicare Other | Admitting: Physician Assistant

## 2015-02-11 ENCOUNTER — Encounter: Payer: Self-pay | Admitting: Physician Assistant

## 2015-02-11 VITALS — BP 100/64 | HR 65 | Ht 66.0 in | Wt 156.8 lb

## 2015-02-11 DIAGNOSIS — E785 Hyperlipidemia, unspecified: Secondary | ICD-10-CM | POA: Diagnosis not present

## 2015-02-11 DIAGNOSIS — I480 Paroxysmal atrial fibrillation: Secondary | ICD-10-CM

## 2015-02-11 DIAGNOSIS — I481 Persistent atrial fibrillation: Secondary | ICD-10-CM

## 2015-02-11 DIAGNOSIS — E860 Dehydration: Secondary | ICD-10-CM

## 2015-02-11 DIAGNOSIS — Z7901 Long term (current) use of anticoagulants: Secondary | ICD-10-CM

## 2015-02-11 DIAGNOSIS — I4819 Other persistent atrial fibrillation: Secondary | ICD-10-CM

## 2015-02-11 DIAGNOSIS — I5042 Chronic combined systolic (congestive) and diastolic (congestive) heart failure: Secondary | ICD-10-CM

## 2015-02-11 DIAGNOSIS — I5022 Chronic systolic (congestive) heart failure: Secondary | ICD-10-CM

## 2015-02-11 NOTE — Progress Notes (Signed)
Patient ID: Jay Guzman, male   DOB: 02/04/1933, 81 y.o.   MRN: [id number]    Date:  02/11/2015   ID:  Jay Guzman, DOB 1932/09/03, MRN [id number]  PCP:  Odette Fraction, MD  Primary Cardiologist: [place]   Chief Complaint  Patient presents with  . Hospitalization Follow-up    patient reports no complaints     History of Present Illness: Jay Guzman is a 81 y.o. male with a past medical history significant for dementia, COPD, hypertension, dyslipidemia and coronary artery disease with remote anterior MI. He had a catheterization in 2000 which showed an occluded LAD. He also has a history of apical thrombus and is on chronic or from therapy for this. He was last seen in the office by Dr. [place] in January of this year and was generally asymptomatic. He is known to have chronic systolic congestive heart failure and moderate LV dysfunction with an EF of 45-50% with akinesis of the mid apical anteroseptal, anterior, inferior and apical myocardium on echo in April 2016. Chronic LV mural thrombus was noted and there is moderate pulmonary hypertension with an RVSP of 53 mmHg.   He was hospitalized 9/8-9/15 worsening confusion, 10 pound weight gain and shortness of breath - he is on home oxygen. He was noted in the emergency department to be in new onset atrial fibrillation, now with rapid ventricular response.  BNP was elevated at 1033. Creatinine was at baseline of 1.08 . INR was subtherapeutic at 1.36, however was 2.7 on 01/10/2015. Cardiology consulted regarding congestive heart failure and management of atrial fibrillation.  He was diuresed down to 168 pounds and was thought to be euvolemic at that time. Lasix dose at discharge was 40 mg daily.  He was started on digoxin and metoprolol 50 mg daily.  Plan was possible cardioversion after 4 weeks of therapeutic Coumadin.  Patient presents for posthospital follow-up. He is living in a skilled nursing facility and his son brought him  in today. Patient doesn't have any specific complaints.  His son is concerned about his lack of activity. He seems more lethargic than usual. According to the nursing staff in the evenings he is more active.    The patient currently denies nausea, vomiting, fever, chest pain, shortness of breath(on home oxygen), orthopnea, dizziness, PND, cough, congestion, abdominal pain, hematochezia, melena, lower extremity edema.  Wt Readings from Last 3 Encounters:  02/11/15 156 lb 12.8 oz (71.124 kg)  01/30/15 168 lb 6.4 oz (76.386 kg)  01/10/15 172 lb (78.019 kg)     Past Medical History  Diagnosis Date  . Coronary disease     WITH REMOTE ANTERIOR MYOCARDIAL INFARCTION  . Mural thrombus of heart     CHRONIC  . Hypertension   . Hyperlipidemia   . CHF (congestive heart failure)   . Dementia     Current Outpatient Prescriptions  Medication Sig Dispense Refill  . Asenapine Maleate (SAPHRIS) 10 MG SUBL Place 10 mg under the tongue 2 (two) times daily.    . bisacodyl (DULCOLAX) 10 MG suppository Place 10 mg rectally as needed for moderate constipation.    . digoxin (LANOXIN) 0.25 MG tablet Take 1 tablet (0.25 mg total) by mouth daily. 30 tablet 0  . donepezil (ARICEPT) 5 MG tablet Take 5 mg by mouth at bedtime.    Marland Kitchen doxazosin (CARDURA) 2 MG tablet Take 1 tablet (2 mg total) by mouth daily. 30 tablet 2  . ezetimibe-simvastatin (VYTORIN) 10-40 MG per tablet Take 1  tablet by mouth at bedtime. 30 tablet 2  . Magnesium Hydroxide (MILK OF MAGNESIA PO) Take 30 mLs by mouth daily as needed (constipation).    . metoprolol succinate (TOPROL-XL) 50 MG 24 hr tablet Take 1 tablet by mouth  daily 90 tablet 4  . mirtazapine (REMERON) 30 MG tablet Take 30 mg by mouth at bedtime.    . nicotine (NICODERM CQ - DOSED IN MG/24 HOURS) 21 mg/24hr patch Place 21 mg onto the skin daily.    . nitroGLYCERIN (NITROSTAT) 0.4 MG SL tablet Place 1 tablet (0.4 mg total) under the tongue every 5 (five) minutes as needed. (Patient  taking differently: Place 0.4 mg under the tongue every 5 (five) minutes as needed for chest pain. ) 25 tablet 6  . ondansetron (ZOFRAN) 4 MG tablet Take 4 mg by mouth every 8 (eight) hours as needed for nausea or vomiting.    Marland Kitchen PROAIR HFA 108 (90 BASE) MCG/ACT inhaler INHALE 2 PUFFS INTO THE LUNGS EVERY 6 HOURS AS NEEDED FOR WHEEZING OR SHORTNESS OF BREATH. 8.5 Inhaler 3  . Tiotropium Bromide-Olodaterol (STIOLTO RESPIMAT) 2.5-2.5 MCG/ACT AERS Inhale 2 puffs into the lungs daily. 4 g 5  . traZODone (DESYREL) 50 MG tablet Take 50 mg by mouth at bedtime.    Marland Kitchen warfarin (COUMADIN) 6 MG tablet Take 6 mg (one tablet) daily on Sunday, Tuesday, Thursday, Saturday 15 tablet 0  . warfarin (COUMADIN) 7.5 MG tablet Take 7.5 mg (one tablet) on Monday, Wednesday, Friday 15 tablet 0  . furosemide (LASIX) 20 MG tablet Stop Lasix for the next 2 days then restart take 20 mg daily 30 tablet 6   No current facility-administered medications for this visit.    Allergies:    Allergies  Allergen Reactions  . Codeine Other (See Comments)    Reaction during surgical  Procedure-unknown  . Morphine And Related Other (See Comments)    Reaction unknown-during surgical procedure    Social History:  The patient  reports that he has quit smoking. His smoking use included Cigarettes. He has a 60 pack-year smoking history. He does not have any smokeless tobacco history on file. He reports that he does not drink alcohol or use illicit drugs.   Family history:   Family History  Problem Relation Age of Onset  . Cerebral aneurysm Father   . Cancer Sister   . Cancer Mother     ROS:  Please see the history of present illness.  All other systems reviewed and negative.   PHYSICAL EXAM: VS:  BP 100/64 mmHg  Pulse 65  Ht 5\' 6"  (1.676 m)  Wt 156 lb 12.8 oz (71.124 kg)  BMI 25.32 kg/m2 Well nourished, well developed, in no acute distress HEENT: Pupils are equal round react to light accommodation extraocular movements are  intact.  Neck: no JVDNo cervical lymphadenopathy. Cardiac: Irregular rate and rhythm. Rate controlled. No murmurs rubs or gallops. Lungs:  clear to auscultation bilaterally, no wheezing, rhonchi or rales Abd: soft, nontender, positive bowel sounds all quadrants,  Ext: no lower extremity edema.  2+ radial and dorsalis pedis pulses. Skin: warm and very dry. Poor skin turgor Neuro:  Grossly normal  EKG:  Atrial fibrillation rate 65 bpm   ASSESSMENT AND PLAN:  Problem List Items Addressed This Visit    Persistent atrial fibrillation   Relevant Medications   furosemide (LASIX) 20 MG tablet   Long term current use of anticoagulant therapy   Hyperlipidemia - Primary   Relevant Medications  furosemide (LASIX) 20 MG tablet   Other Relevant Orders   EKG 12-Lead   Dehydration   Chronic combined systolic and diastolic heart failure, NYHA class 2   Relevant Medications   furosemide (LASIX) 20 MG tablet      Dehydration With an additional 12 pounds of weight loss of suspect the patient is dehydrated. His skin is very dry with poor turgor. Holding Lasix for September 27 through the 29th. Then resuming at 20 mg daily.  Persistent atrial fibrillation Continue metoprolol XL and digoxin. On Coumadin, INR being checked on Thursdayat SNF.  We considered DC CV of these had 4 weeks of therapeutic INR.  Chronic combined systolic and diastolic heart failure The patient is lost another 12 pounds since his discharge. He is now 156 pounds. He looks very dry.  He just had a basic metabolic panel drawn at the skilled nursing facility. Will review those results when they're sent in. For now hold Lasix today tomorrow in the 29th. Resume at 20 mg daily on September 30.  Further changes depending on lab results.  CAD with remote anterior MI No complaints of angina  Essential hypertension Blood pressure is on the low side at 100/60. Holding the Lasix will improve this.  Hyperlipidemia Continue  Vytorin  H/o chronic LV mural thrombus

## 2015-02-11 NOTE — Patient Instructions (Signed)
Stop Lasix for 2 days then take 20 mg daily   Keep appointment with Dr.Jordan Wednesday 04/09/15 at 3:45 pm

## 2015-02-20 ENCOUNTER — Non-Acute Institutional Stay (SKILLED_NURSING_FACILITY): Payer: Medicare Other | Admitting: Internal Medicine

## 2015-02-20 DIAGNOSIS — L03115 Cellulitis of right lower limb: Secondary | ICD-10-CM | POA: Diagnosis not present

## 2015-02-20 NOTE — Progress Notes (Signed)
MRN: [id number] Name: Jay Guzman  Sex: male Age: 81 y.o. DOB: 1933-01-29  [facility] [facility] Facility/Room:[facility] Level Of Care: SNF Provider: Inocencio Homes D Emergency Contacts: Extended Emergency Contact Information Primary Emergency Contact: [name], [name] Address: [address]          [name], [name] [address] [place] Phone: [phone] Mobile Phone: [phone] Relation: Son Secondary Emergency Contact: [name] Address: [address], [place] Phone: [phone] Relation: Spouse  Code Status:   Allergies: Codeine and Morphine and related  Chief Complaint  Patient presents with  . Acute Visit    HPI: Patient is 81 y.o. male who nursing asked me to see for redness on R foot that appears to be early cellulitis. Pt has not had fevers, MS change, nausea, pain , SOB or other systemic sx.  Past Medical History  Diagnosis Date  . Coronary disease     WITH REMOTE ANTERIOR MYOCARDIAL INFARCTION  . Mural thrombus of heart (HCC)     CHRONIC  . Hypertension   . Hyperlipidemia   . CHF (congestive heart failure) ([place])   . Dementia   . A-fib ([place])   . COPD (chronic obstructive pulmonary disease) ([place])   . Shortness of breath dyspnea     Past Surgical History  Procedure Laterality Date  . Cardiac catheterization  2000  . Hernia repair      STATUS POST BILATERAL  . Abdominal aortic aneurysm repair  2008      Medication List       This list is accurate as of: 02/20/15 11:59 PM.  Always use your most recent med list.               bisacodyl 10 MG suppository  Commonly known as:  DULCOLAX  Place 10 mg rectally as needed for moderate constipation.     digoxin 0.25 MG tablet  Commonly known as:  LANOXIN  Take 1 tablet (0.25 mg total) by mouth daily.     donepezil 5 MG tablet  Commonly known as:  ARICEPT  Take one tablet by mouth once daily at bedtime for  dementia     doxazosin 2 MG tablet  Commonly known as:  CARDURA  Take 1 tablet (2 mg total) by mouth daily.     ezetimibe-simvastatin 10-40 MG tablet  Commonly known as:  VYTORIN  Take 1 tablet by mouth at bedtime.     furosemide 20 MG tablet  Commonly known as:  LASIX  Take one tablet by mouth once daily for CHF     metoprolol succinate 50 MG 24 hr tablet  Commonly known as:  TOPROL-XL  Take 1 tablet by mouth  daily     MILK OF MAGNESIA PO  Take 30 mLs by mouth daily as needed (constipation).     mirtazapine 30 MG tablet  Commonly known as:  REMERON  Take one tablet by mouth once daily at bedtime for sleep     nicotine 21 mg/24hr patch  Commonly known as:  NICODERM CQ - dosed in mg/24 hours  Place 21 mg onto the skin daily.     nitroGLYCERIN 0.4 MG SL tablet  Commonly known as:  NITROSTAT  Place 1 tablet (0.4 mg total) under the tongue every 5 (five) minutes as needed.     ondansetron 4 MG tablet  Commonly known as:  ZOFRAN  Take 4 mg  by mouth every 8 (eight) hours as needed for nausea or vomiting.     PROAIR HFA 108 (90 BASE) MCG/ACT inhaler  Generic drug:  albuterol  INHALE 2 PUFFS INTO THE LUNGS EVERY 6 HOURS AS NEEDED FOR WHEEZING OR SHORTNESS OF BREATH.     SAPHRIS 10 MG Subl  Generic drug:  Asenapine Maleate  Place 10 mg under the tongue 2 (two) times daily.     Tiotropium Bromide-Olodaterol 2.5-2.5 MCG/ACT Aers  Commonly known as:  STIOLTO RESPIMAT  Inhale 2 puffs into the lungs daily.     traZODone 50 MG tablet  Commonly known as:  DESYREL  Take 25 mg by mouth at bedtime.     warfarin 6 MG tablet  Commonly known as:  COUMADIN  Take 6 mg (one tablet) daily on Sunday, Tuesday, Thursday, Saturday     warfarin 7.5 MG tablet  Commonly known as:  COUMADIN  Take 7.5 mg (one tablet) on Monday, Wednesday, Friday        No orders of the defined types were placed in this encounter.    Immunization History  Administered Date(s) Administered  . PPD  Test 02/25/2015  . Pneumococcal Polysaccharide-23 01/04/2014    Social History  Substance Use Topics  . Smoking status: Former Smoker -- 1.00 packs/day for 60 years    Types: Cigarettes  . Smokeless tobacco: Not on file  . Alcohol Use: No    Review of Systems  DATA OBTAINED: from nurse GENERAL:  no fevers, fatigue, appetite changes SKIN: as in HPI HEENT: No complaint RESPIRATORY: No cough, wheezing, SOB CARDIAC: No chest pain, palpitations, lower extremity edema  GI: No abdominal pain, No N/V/D or constipation, No heartburn or reflux  GU: No dysuria, frequency or urgency, or incontinence  MUSCULOSKELETAL: No unrelieved bone/joint pain NEUROLOGIC: No headache, dizziness  PSYCHIATRIC: No overt anxiety or sadness  Filed Vitals:   03/01/15 1620  BP: 118/72  Pulse: 76  Temp: 98.1 F (36.7 C)  Resp: 19    Physical Exam  GENERAL APPEARANCE: Alert, non conversant, No acute distress  SKIN: chronic changes BLE; there is a blush of redness across top of R foot HEENT: Unremarkable RESPIRATORY: Breathing is even, unlabored. Lung sounds are clear   CARDIOVASCULAR: Heart RRR no murmurs, rubs or gallops. Some peripheral edema  GASTROINTESTINAL: Abdomen is soft, non-tender, not distended w/ normal bowel sounds.  GENITOURINARY: Bladder non tender, not distended  MUSCULOSKELETAL: No abnormal joints or musculature NEUROLOGIC: Cranial nerves 2-12 grossly intact. Moves all extremities PSYCHIATRIC: dementia, no behavioral issues  Patient Active Problem List   Diagnosis Date Noted  . Cellulitis of foot, right 03/01/2015  . Elevated troponin 02/22/2015  . Lethargy 02/22/2015  . Prolonged Q-T interval on ECG 02/22/2015  . Acute hypernatremia 02/22/2015  . Dehydration 02/11/2015  . CKD (chronic kidney disease), stage III 01/29/2015  . Persistent atrial fibrillation ([place]) 01/29/2015  . Acute on chronic combined systolic and diastolic CHF (congestive heart failure) ([place]) 01/29/2015  .  Acute on chronic respiratory failure ([place]) 01/29/2015  . Acute on chronic systolic congestive heart failure ([place])   . Acute on chronic systolic CHF (congestive heart failure), NYHA class 4 ([place]) 01/23/2015  . UTI (lower urinary tract infection) 01/23/2015  . New onset a-fib ([place]) 01/23/2015  . Dementia   . Acute respiratory failure with hypoxia ([place]) 08/28/2014  . Macrocytosis without anemia 08/27/2014  . COPD exacerbation ([place]) 08/26/2014  . COPD (chronic obstructive pulmonary disease) ([place]) 08/26/2014  . COPD with acute  exacerbation ([place]) 08/26/2014  . CAP (community acquired pneumonia) 08/26/2014  . Encounter for therapeutic drug monitoring 08/14/2013  . Long term current use of anticoagulant therapy 01/08/2011  . Tobacco abuse 08/12/2010  . Coronary disease   . Mural thrombus of heart ([place])   . Hypertensive heart disease with CHF ([place])   . Hyperlipidemia   . Chronic combined systolic and diastolic heart failure, NYHA class 2 (HCC)     CBC    Component Value Date/Time   WBC 11.1* 02/25/2015 0411   RBC 4.31 02/25/2015 0411   HGB 14.1 02/25/2015 0411   HCT 43.8 02/25/2015 0411   HCT 44.6 08/27/2014 1403   PLT 87* 02/25/2015 0411   MCV 101.6* 02/25/2015 0411   LYMPHSABS 1.5 02/22/2015 0702   MONOABS 0.8 02/22/2015 0702   EOSABS 0.2 02/22/2015 0702   BASOSABS 0.0 02/22/2015 0702    CMP     Component Value Date/Time   NA 141 02/24/2015 0419   K 4.4 02/24/2015 0419   CL 104 02/24/2015 0419   CO2 31 02/24/2015 0419   GLUCOSE 158* 02/24/2015 0419   BUN 39* 02/24/2015 0419   CREATININE 1.20 02/24/2015 0419   CREATININE 1.11 01/10/2015 1014   CALCIUM 8.4* 02/24/2015 0419   PROT 5.3* 02/22/2015 1037   ALBUMIN 3.2* 02/22/2015 1037   AST 22 02/22/2015 1037   ALT 19 02/22/2015 1037   ALKPHOS 49 02/22/2015 1037   BILITOT 1.9* 02/22/2015 1037   GFRNONAA 56* 02/24/2015 0419   GFRNONAA 63 01/10/2015 1014   GFRAA >60 02/24/2015 0419   GFRAA 73 01/10/2015 1014     Assessment and Plan  Cellulitis of foot, right Early - start doxycycline 100 mg BID for 7 days    Hennie Duos, MD

## 2015-02-22 ENCOUNTER — Encounter (HOSPITAL_COMMUNITY): Payer: Self-pay | Admitting: *Deleted

## 2015-02-22 ENCOUNTER — Emergency Department (HOSPITAL_COMMUNITY): Payer: Medicare Other

## 2015-02-22 ENCOUNTER — Inpatient Hospital Stay (HOSPITAL_COMMUNITY)
Admission: EM | Admit: 2015-02-22 | Discharge: 2015-02-25 | DRG: 190 | Disposition: A | Discharge status: Skilled Nursing Facility | Payer: Medicare Other | Attending: Internal Medicine | Admitting: Internal Medicine

## 2015-02-22 DIAGNOSIS — G9340 Encephalopathy, unspecified: Secondary | ICD-10-CM | POA: Diagnosis present

## 2015-02-22 DIAGNOSIS — R7989 Other specified abnormal findings of blood chemistry: Secondary | ICD-10-CM | POA: Diagnosis not present

## 2015-02-22 DIAGNOSIS — I252 Old myocardial infarction: Secondary | ICD-10-CM | POA: Diagnosis not present

## 2015-02-22 DIAGNOSIS — R0602 Shortness of breath: Secondary | ICD-10-CM | POA: Diagnosis not present

## 2015-02-22 DIAGNOSIS — I110 Hypertensive heart disease with heart failure: Secondary | ICD-10-CM | POA: Diagnosis present

## 2015-02-22 DIAGNOSIS — Z87891 Personal history of nicotine dependence: Secondary | ICD-10-CM

## 2015-02-22 DIAGNOSIS — N289 Disorder of kidney and ureter, unspecified: Secondary | ICD-10-CM

## 2015-02-22 DIAGNOSIS — I481 Persistent atrial fibrillation: Secondary | ICD-10-CM | POA: Diagnosis not present

## 2015-02-22 DIAGNOSIS — R7981 Abnormal blood-gas level: Secondary | ICD-10-CM

## 2015-02-22 DIAGNOSIS — I4819 Other persistent atrial fibrillation: Secondary | ICD-10-CM | POA: Diagnosis present

## 2015-02-22 DIAGNOSIS — E860 Dehydration: Secondary | ICD-10-CM | POA: Diagnosis not present

## 2015-02-22 DIAGNOSIS — E785 Hyperlipidemia, unspecified: Secondary | ICD-10-CM | POA: Diagnosis present

## 2015-02-22 DIAGNOSIS — J441 Chronic obstructive pulmonary disease with (acute) exacerbation: Principal | ICD-10-CM | POA: Diagnosis present

## 2015-02-22 DIAGNOSIS — I5042 Chronic combined systolic (congestive) and diastolic (congestive) heart failure: Secondary | ICD-10-CM | POA: Diagnosis present

## 2015-02-22 DIAGNOSIS — R9431 Abnormal electrocardiogram [ECG] [EKG]: Secondary | ICD-10-CM | POA: Diagnosis present

## 2015-02-22 DIAGNOSIS — I472 Ventricular tachycardia: Secondary | ICD-10-CM | POA: Diagnosis present

## 2015-02-22 DIAGNOSIS — Z7901 Long term (current) use of anticoagulants: Secondary | ICD-10-CM

## 2015-02-22 DIAGNOSIS — Z9981 Dependence on supplemental oxygen: Secondary | ICD-10-CM | POA: Diagnosis not present

## 2015-02-22 DIAGNOSIS — F0390 Unspecified dementia without behavioral disturbance: Secondary | ICD-10-CM | POA: Diagnosis present

## 2015-02-22 DIAGNOSIS — E870 Hyperosmolality and hypernatremia: Secondary | ICD-10-CM | POA: Diagnosis present

## 2015-02-22 DIAGNOSIS — R778 Other specified abnormalities of plasma proteins: Secondary | ICD-10-CM

## 2015-02-22 DIAGNOSIS — J189 Pneumonia, unspecified organism: Secondary | ICD-10-CM

## 2015-02-22 DIAGNOSIS — R5383 Other fatigue: Secondary | ICD-10-CM | POA: Diagnosis present

## 2015-02-22 DIAGNOSIS — Z809 Family history of malignant neoplasm, unspecified: Secondary | ICD-10-CM

## 2015-02-22 HISTORY — DX: Unspecified atrial fibrillation: I48.91

## 2015-02-22 HISTORY — DX: Reserved for inherently not codable concepts without codable children: IMO0001

## 2015-02-22 HISTORY — DX: Chronic obstructive pulmonary disease, unspecified: J44.9

## 2015-02-22 LAB — CBC WITH DIFFERENTIAL/PLATELET
BASOS PCT: 0 %
Basophils Absolute: 0 10*3/uL (ref 0.0–0.1)
Eosinophils Absolute: 0.2 10*3/uL (ref 0.0–0.7)
Eosinophils Relative: 2 %
HEMATOCRIT: 48.5 % (ref 39.0–52.0)
HEMOGLOBIN: 15.9 g/dL (ref 13.0–17.0)
LYMPHS ABS: 1.5 10*3/uL (ref 0.7–4.0)
Lymphocytes Relative: 16 %
MCH: 33.5 pg (ref 26.0–34.0)
MCHC: 32.8 g/dL (ref 30.0–36.0)
MCV: 102.1 fL — AB (ref 78.0–100.0)
MONO ABS: 0.8 10*3/uL (ref 0.1–1.0)
MONOS PCT: 9 %
NEUTROS ABS: 6.7 10*3/uL (ref 1.7–7.7)
NEUTROS PCT: 73 %
Platelets: 111 10*3/uL — ABNORMAL LOW (ref 150–400)
RBC: 4.75 MIL/uL (ref 4.22–5.81)
RDW: 15.8 % — AB (ref 11.5–15.5)
WBC: 9.3 10*3/uL (ref 4.0–10.5)

## 2015-02-22 LAB — HEPATIC FUNCTION PANEL
ALBUMIN: 3.2 g/dL — AB (ref 3.5–5.0)
ALK PHOS: 49 U/L (ref 38–126)
ALT: 19 U/L (ref 17–63)
AST: 22 U/L (ref 15–41)
BILIRUBIN TOTAL: 1.9 mg/dL — AB (ref 0.3–1.2)
Bilirubin, Direct: 0.5 mg/dL (ref 0.1–0.5)
Indirect Bilirubin: 1.4 mg/dL — ABNORMAL HIGH (ref 0.3–0.9)
Total Protein: 5.3 g/dL — ABNORMAL LOW (ref 6.5–8.1)

## 2015-02-22 LAB — CREATININE, SERUM
CREATININE: 1.33 mg/dL — AB (ref 0.61–1.24)
GFR calc Af Amer: 57 mL/min — ABNORMAL LOW (ref >60)
GFR, EST NON AFRICAN AMERICAN: 49 mL/min — AB (ref >60)

## 2015-02-22 LAB — I-STAT ARTERIAL BLOOD GAS, ED
ACID-BASE EXCESS: 7 mmol/L — AB (ref 0.0–2.0)
BICARBONATE: 33.1 meq/L — AB (ref 20.0–24.0)
O2 SAT: 94 %
PO2 ART: 73 mmHg — AB (ref 80.0–100.0)
TCO2: 35 mmol/L (ref 0–100)
pCO2 arterial: 52.1 mmHg — ABNORMAL HIGH (ref 35.0–45.0)
pH, Arterial: 7.411 (ref 7.350–7.450)

## 2015-02-22 LAB — GLUCOSE, CAPILLARY: Glucose-Capillary: 178 mg/dL — ABNORMAL HIGH (ref 65–99)

## 2015-02-22 LAB — BASIC METABOLIC PANEL
Anion gap: 9 (ref 5–15)
BUN: 29 mg/dL — ABNORMAL HIGH (ref 6–20)
CHLORIDE: 106 mmol/L (ref 101–111)
CO2: 34 mmol/L — ABNORMAL HIGH (ref 22–32)
CREATININE: 1.34 mg/dL — AB (ref 0.61–1.24)
Calcium: 9.1 mg/dL (ref 8.9–10.3)
GFR calc Af Amer: 56 mL/min — ABNORMAL LOW (ref >60)
GFR, EST NON AFRICAN AMERICAN: 49 mL/min — AB (ref >60)
GLUCOSE: 110 mg/dL — AB (ref 65–99)
Potassium: 4.1 mmol/L (ref 3.5–5.1)
SODIUM: 149 mmol/L — AB (ref 135–145)

## 2015-02-22 LAB — TROPONIN I
TROPONIN I: 0.09 ng/mL — AB (ref <0.031)
TROPONIN I: 0.06 ng/mL — AB (ref <0.031)
Troponin I: 0.07 ng/mL — ABNORMAL HIGH (ref <0.031)

## 2015-02-22 LAB — BRAIN NATRIURETIC PEPTIDE: B Natriuretic Peptide: 690.8 pg/mL — ABNORMAL HIGH (ref 0.0–100.0)

## 2015-02-22 LAB — DIGOXIN LEVEL: Digoxin Level: 0.9 ng/mL (ref 0.8–2.0)

## 2015-02-22 LAB — CBG MONITORING, ED: GLUCOSE-CAPILLARY: 116 mg/dL — AB (ref 65–99)

## 2015-02-22 LAB — PROTIME-INR
INR: 3.58 — AB (ref 0.00–1.49)
Prothrombin Time: 35 seconds — ABNORMAL HIGH (ref 11.6–15.2)

## 2015-02-22 LAB — AMMONIA: AMMONIA: 18 umol/L (ref 9–35)

## 2015-02-22 MED ORDER — METHYLPREDNISOLONE SODIUM SUCC 125 MG IJ SOLR
125.0000 mg | Freq: Once | INTRAMUSCULAR | Status: AC
  Administered 2015-02-22: 125 mg via INTRAVENOUS
  Filled 2015-02-22: qty 2

## 2015-02-22 MED ORDER — HEPARIN SODIUM (PORCINE) 5000 UNIT/ML IJ SOLN: 5000.0000 [IU] | Freq: Three times a day (TID) | INTRAMUSCULAR | Status: DC

## 2015-02-22 MED ORDER — IPRATROPIUM-ALBUTEROL 0.5-2.5 (3) MG/3ML IN SOLN
3.0000 mL | Freq: Once | RESPIRATORY_TRACT | Status: AC
  Administered 2015-02-22: 3 mL via RESPIRATORY_TRACT
  Filled 2015-02-22: qty 3

## 2015-02-22 MED ORDER — DEXTROSE 5 % IV SOLN
1.0000 g | INTRAVENOUS | Status: DC
  Administered 2015-02-22 – 2015-02-24 (×3): 1 g via INTRAVENOUS
  Filled 2015-02-22 (×4): qty 10

## 2015-02-22 MED ORDER — FLEET ENEMA 7-19 GM/118ML RE ENEM: 1.0000 | ENEMA | RECTAL | Status: DC | PRN

## 2015-02-22 MED ORDER — DICLOFENAC SODIUM 1 % TD GEL
2.0000 g | Freq: Two times a day (BID) | TRANSDERMAL | Status: DC
  Administered 2015-02-22 – 2015-02-25 (×6): 2 g via TOPICAL
  Filled 2015-02-22 (×2): qty 100

## 2015-02-22 MED ORDER — NICOTINE 21 MG/24HR TD PT24
21.0000 mg | MEDICATED_PATCH | Freq: Every day | TRANSDERMAL | Status: DC
  Administered 2015-02-22 – 2015-02-25 (×4): 21 mg via TRANSDERMAL
  Filled 2015-02-22 (×3): qty 1

## 2015-02-22 MED ORDER — WARFARIN - PHARMACIST DOSING INPATIENT
Freq: Every day | Status: DC
  Administered 2015-02-24: 18:00:00

## 2015-02-22 MED ORDER — BISACODYL 10 MG RE SUPP
10.0000 mg | RECTAL | Status: DC | PRN
  Administered 2015-02-25: 10 mg via RECTAL
  Filled 2015-02-22: qty 1

## 2015-02-22 MED ORDER — AZITHROMYCIN 500 MG PO TABS
500.0000 mg | ORAL_TABLET | Freq: Every day | ORAL | Status: AC
  Administered 2015-02-22: 500 mg via ORAL
  Filled 2015-02-22: qty 1

## 2015-02-22 MED ORDER — TIOTROPIUM BROMIDE-OLODATEROL 2.5-2.5 MCG/ACT IN AERS
2.0000 | INHALATION_SPRAY | Freq: Every day | RESPIRATORY_TRACT | Status: DC
  Administered 2015-02-22: 2 via RESPIRATORY_TRACT

## 2015-02-22 MED ORDER — SODIUM CHLORIDE 0.9 % IV SOLN
INTRAVENOUS | Status: DC
  Administered 2015-02-22: 15:00:00 via INTRAVENOUS

## 2015-02-22 MED ORDER — PANTOPRAZOLE SODIUM 40 MG PO TBEC
40.0000 mg | DELAYED_RELEASE_TABLET | Freq: Every day | ORAL | Status: DC
  Administered 2015-02-22 – 2015-02-25 (×4): 40 mg via ORAL
  Filled 2015-02-22 (×3): qty 1

## 2015-02-22 MED ORDER — SODIUM CHLORIDE 0.9 % IJ SOLN
3.0000 mL | Freq: Two times a day (BID) | INTRAMUSCULAR | Status: DC
  Administered 2015-02-23 – 2015-02-25 (×4): 3 mL via INTRAVENOUS

## 2015-02-22 MED ORDER — EZETIMIBE-SIMVASTATIN 10-40 MG PO TABS
1.0000 | ORAL_TABLET | Freq: Every day | ORAL | Status: DC
  Administered 2015-02-22 – 2015-02-25 (×3): 1 via ORAL
  Filled 2015-02-22 (×4): qty 1

## 2015-02-22 MED ORDER — ACETAMINOPHEN 650 MG RE SUPP: 650.0000 mg | Freq: Four times a day (QID) | RECTAL | Status: DC | PRN

## 2015-02-22 MED ORDER — DOXAZOSIN MESYLATE 2 MG PO TABS
2.0000 mg | ORAL_TABLET | Freq: Every day | ORAL | Status: DC
  Administered 2015-02-22 – 2015-02-25 (×4): 2 mg via ORAL
  Filled 2015-02-22 (×4): qty 1

## 2015-02-22 MED ORDER — METHYLPREDNISOLONE SODIUM SUCC 125 MG IJ SOLR
60.0000 mg | Freq: Two times a day (BID) | INTRAMUSCULAR | Status: DC
  Administered 2015-02-22 – 2015-02-25 (×6): 60 mg via INTRAVENOUS
  Filled 2015-02-22 (×5): qty 2

## 2015-02-22 MED ORDER — MIRTAZAPINE 15 MG PO TABS
30.0000 mg | ORAL_TABLET | Freq: Every day | ORAL | Status: DC
  Administered 2015-02-22 – 2015-02-25 (×3): 30 mg via ORAL
  Filled 2015-02-22 (×2): qty 2

## 2015-02-22 MED ORDER — METOPROLOL SUCCINATE ER 50 MG PO TB24
50.0000 mg | ORAL_TABLET | Freq: Every day | ORAL | Status: DC
  Administered 2015-02-22 – 2015-02-25 (×4): 50 mg via ORAL
  Filled 2015-02-22 (×3): qty 1

## 2015-02-22 MED ORDER — NITROGLYCERIN 0.4 MG SL SUBL: 0.4000 mg | SUBLINGUAL_TABLET | SUBLINGUAL | Status: DC | PRN

## 2015-02-22 MED ORDER — DONEPEZIL HCL 5 MG PO TABS
5.0000 mg | ORAL_TABLET | Freq: Every day | ORAL | Status: DC
  Administered 2015-02-22 – 2015-02-25 (×3): 5 mg via ORAL
  Filled 2015-02-22 (×3): qty 1

## 2015-02-22 MED ORDER — MAGNESIUM HYDROXIDE 400 MG/5ML PO SUSP: 30.0000 mL | Freq: Every day | ORAL | Status: DC | PRN

## 2015-02-22 MED ORDER — LEVALBUTEROL HCL 0.63 MG/3ML IN NEBU
0.6300 mg | INHALATION_SOLUTION | Freq: Four times a day (QID) | RESPIRATORY_TRACT | Status: DC
  Administered 2015-02-23 – 2015-02-24 (×6): 0.63 mg via RESPIRATORY_TRACT
  Filled 2015-02-22 (×7): qty 3

## 2015-02-22 MED ORDER — GUAIFENESIN ER 600 MG PO TB12
1200.0000 mg | ORAL_TABLET | Freq: Two times a day (BID) | ORAL | Status: DC
  Administered 2015-02-22 – 2015-02-25 (×6): 1200 mg via ORAL
  Filled 2015-02-22 (×5): qty 2

## 2015-02-22 MED ORDER — DIGOXIN 250 MCG PO TABS
0.2500 mg | ORAL_TABLET | Freq: Every day | ORAL | Status: DC
  Administered 2015-02-23 – 2015-02-25 (×3): 0.25 mg via ORAL
  Filled 2015-02-22 (×2): qty 1

## 2015-02-22 MED ORDER — AZITHROMYCIN 250 MG PO TABS
250.0000 mg | ORAL_TABLET | Freq: Every day | ORAL | Status: DC
  Administered 2015-02-23 – 2015-02-25 (×3): 250 mg via ORAL
  Filled 2015-02-22 (×3): qty 1

## 2015-02-22 MED ORDER — ACETAMINOPHEN 325 MG PO TABS
650.0000 mg | ORAL_TABLET | Freq: Four times a day (QID) | ORAL | Status: DC | PRN
  Administered 2015-02-23: 650 mg via ORAL
  Filled 2015-02-22: qty 2

## 2015-02-22 MED ORDER — SENNOSIDES-DOCUSATE SODIUM 8.6-50 MG PO TABS: 1.0000 | ORAL_TABLET | Freq: Every evening | ORAL | Status: DC | PRN

## 2015-02-22 MED ORDER — SACCHAROMYCES BOULARDII 250 MG PO CAPS
250.0000 mg | ORAL_CAPSULE | Freq: Two times a day (BID) | ORAL | Status: DC
  Administered 2015-02-22 – 2015-02-25 (×6): 250 mg via ORAL
  Filled 2015-02-22 (×5): qty 1

## 2015-02-22 MED ORDER — ALUM & MAG HYDROXIDE-SIMETH 200-200-20 MG/5ML PO SUSP: 30.0000 mL | Freq: Four times a day (QID) | ORAL | Status: DC | PRN

## 2015-02-22 NOTE — ED Provider Notes (Signed)
CSN: [id number]     Arrival date & time 02/22/15  6433 History   First MD Initiated Contact with Patient 02/22/15 [phone]     Chief Complaint  Patient presents with  . Shortness of Breath     (Consider location/radiation/quality/duration/timing/severity/associated sxs/prior Treatment) Patient is a 81 y.o. male presenting with shortness of breath. The history is provided by the patient.  Shortness of Breath He comes in because of difficulty breathing. He states he woke up at 4:30 AM with difficulty breathing. His breathing was sent not quite normal yesterday but states that he was having normal breathing 2 days ago. He did use his albuterol with some relief of his dyspnea. There is a cough productive of some yellowish to whitish sputum. He denies fever, chills, sweats. He denies chest pain. Of note, he is on continuous oxygen. A family member is with him he states that he does appear close to his baseline but is more somnolent than normal.  Past Medical History  Diagnosis Date  . Coronary disease     WITH REMOTE ANTERIOR MYOCARDIAL INFARCTION  . Mural thrombus of heart (HCC)     CHRONIC  . Hypertension   . Hyperlipidemia   . CHF (congestive heart failure) ([place])   . Dementia    Past Surgical History  Procedure Laterality Date  . Cardiac catheterization  2000  . Hernia repair      STATUS POST BILATERAL  . Abdominal aortic aneurysm repair  2008   Family History  Problem Relation Age of Onset  . Cerebral aneurysm Father   . Cancer Sister   . Cancer Mother    Social History  Substance Use Topics  . Smoking status: Former Smoker -- 1.00 packs/day for 60 years    Types: Cigarettes  . Smokeless tobacco: None  . Alcohol Use: No    Review of Systems  Respiratory: Positive for shortness of breath.   All other systems reviewed and are negative.     Allergies  Codeine and Morphine and related  Home Medications   Prior to Admission medications   Medication Sig Start Date End  Date Taking? Authorizing Provider  Asenapine Maleate (SAPHRIS) 10 MG SUBL Place 10 mg under the tongue 2 (two) times daily.    Historical Provider, MD  bisacodyl (DULCOLAX) 10 MG suppository Place 10 mg rectally as needed for moderate constipation.    Historical Provider, MD  digoxin (LANOXIN) 0.25 MG tablet Take 1 tablet (0.25 mg total) by mouth daily. 01/30/15   Orson Eva, MD  donepezil (ARICEPT) 5 MG tablet Take 5 mg by mouth at bedtime.    Historical Provider, MD  doxazosin (CARDURA) 2 MG tablet Take 1 tablet (2 mg total) by mouth daily. 01/13/15   Peter M Martinique, MD  ezetimibe-simvastatin (VYTORIN) 10-40 MG per tablet Take 1 tablet by mouth at bedtime. 01/10/15   Peter M Martinique, MD  furosemide (LASIX) 20 MG tablet Stop Lasix for the next 2 days then restart take 20 mg daily 02/11/15   Brett Canales, PA-C  Magnesium Hydroxide (MILK OF MAGNESIA PO) Take 30 mLs by mouth daily as needed (constipation).    Historical Provider, MD  metoprolol succinate (TOPROL-XL) 50 MG 24 hr tablet Take 1 tablet by mouth  daily 07/01/14   Lendon Colonel, NP  mirtazapine (REMERON) 30 MG tablet Take 30 mg by mouth at bedtime.    Historical Provider, MD  nicotine (NICODERM CQ - DOSED IN MG/24 HOURS) 21 mg/24hr patch Place 21 mg  onto the skin daily.    Historical Provider, MD  nitroGLYCERIN (NITROSTAT) 0.4 MG SL tablet Place 1 tablet (0.4 mg total) under the tongue every 5 (five) minutes as needed. Patient taking differently: Place 0.4 mg under the tongue every 5 (five) minutes as needed for chest pain.  09/04/13   Peter M Martinique, MD  ondansetron (ZOFRAN) 4 MG tablet Take 4 mg by mouth every 8 (eight) hours as needed for nausea or vomiting.    Historical Provider, MD  PROAIR HFA 108 (90 BASE) MCG/ACT inhaler INHALE 2 PUFFS INTO THE LUNGS EVERY 6 HOURS AS NEEDED FOR WHEEZING OR SHORTNESS OF BREATH. 11/19/14   Susy Frizzle, MD  Tiotropium Bromide-Olodaterol (STIOLTO RESPIMAT) 2.5-2.5 MCG/ACT AERS Inhale 2 puffs into the  lungs daily. 10/01/14   Susy Frizzle, MD  traZODone (DESYREL) 50 MG tablet Take 50 mg by mouth at bedtime.    Historical Provider, MD  warfarin (COUMADIN) 6 MG tablet Take 6 mg (one tablet) daily on Sunday, Tuesday, Thursday, Saturday 01/30/15   Orson Eva, MD  warfarin (COUMADIN) 7.5 MG tablet Take 7.5 mg (one tablet) on Monday, Wednesday, Friday 01/30/15   Orson Eva, MD   BP 138/82 mmHg  Pulse 42  Resp 18  SpO2 97% Physical Exam  Nursing note and vitals reviewed.  81 year old male, resting comfortably and in no acute distress. Vital signs are normal (although pulse was recorded at 42, cardiac monitor the same time was 85). Oxygen saturation is 97%, which is normal. Head is normocephalic and atraumatic. PERRLA, EOMI. Oropharynx is clear. Neck is nontender and supple without adenopathy or JVD. Back is nontender and there is no CVA tenderness. Lungs have coarse expiratory wheezes throughout with fine rales heard at the right base. Chest is nontender. Heart has regular rate and rhythm without murmur. Abdomen is soft, flat, nontender without masses or hepatosplenomegaly and peristalsis is normoactive. Extremities have no cyanosis or edema, full range of motion is present. Skin is warm and dry without rash. Neurologic: He is somewhat somnolent but easily arousable, cranial nerves are intact, there are no motor or sensory deficits.  ED Course  Procedures (including critical care time) Labs Review Results for orders placed or performed during the hospital encounter of 84/53/64  Basic metabolic panel  Result Value Ref Range   Sodium 149 (H) 135 - 145 mmol/L   Potassium 4.1 3.5 - 5.1 mmol/L   Chloride 106 101 - 111 mmol/L   CO2 34 (H) 22 - 32 mmol/L   Glucose, Bld 110 (H) 65 - 99 mg/dL   BUN 29 (H) 6 - 20 mg/dL   Creatinine, Ser 1.34 (H) 0.61 - 1.24 mg/dL   Calcium 9.1 8.9 - 10.3 mg/dL   GFR calc non Af Amer 49 (L) >60 mL/min   GFR calc Af Amer 56 (L) >60 mL/min   Anion gap 9 5 - 15   Troponin I  Result Value Ref Range   Troponin I 0.09 (H) <0.031 ng/mL  Brain natriuretic peptide  Result Value Ref Range   B Natriuretic Peptide 690.8 (H) 0.0 - 100.0 pg/mL  CBC with Differential  Result Value Ref Range   WBC 9.3 4.0 - 10.5 K/uL   RBC 4.75 4.22 - 5.81 MIL/uL   Hemoglobin 15.9 13.0 - 17.0 g/dL   HCT 48.5 39.0 - 52.0 %   MCV 102.1 (H) 78.0 - 100.0 fL   MCH 33.5 26.0 - 34.0 pg   MCHC 32.8 30.0 - 36.0 g/dL  RDW 15.8 (H) 11.5 - 15.5 %   Platelets 111 (L) 150 - 400 K/uL   Neutrophils Relative % 73 %   Neutro Abs 6.7 1.7 - 7.7 K/uL   Lymphocytes Relative 16 %   Lymphs Abs 1.5 0.7 - 4.0 K/uL   Monocytes Relative 9 %   Monocytes Absolute 0.8 0.1 - 1.0 K/uL   Eosinophils Relative 2 %   Eosinophils Absolute 0.2 0.0 - 0.7 K/uL   Basophils Relative 0 %   Basophils Absolute 0.0 0.0 - 0.1 K/uL  I-Stat arterial blood gas, ED  Result Value Ref Range   pH, Arterial 7.411 7.350 - 7.450   pCO2 arterial 52.1 (H) 35.0 - 45.0 mmHg   pO2, Arterial 73.0 (L) 80.0 - 100.0 mmHg   Bicarbonate 33.1 (H) 20.0 - 24.0 mEq/L   TCO2 35 0 - 100 mmol/L   O2 Saturation 94.0 %   Acid-Base Excess 7.0 (H) 0.0 - 2.0 mmol/L   Patient temperature HIDE    Collection site RADIAL, ALLEN'S TEST ACCEPTABLE    Drawn by Operator    Sample type ARTERIAL    Imaging Review Dg Chest Port 1 View  02/22/2015   CLINICAL DATA:  Dyspnea.  EXAM: PORTABLE CHEST 1 VIEW  COMPARISON:  01/23/2015 chest radiograph  FINDINGS: Stable cardiomediastinal silhouette with mild cardiomegaly. No pneumothorax. No pleural effusion. There is cephalization of the pulmonary vasculature without overt pulmonary edema. Curvilinear opacities at the lung bases likely represent atelectasis.  IMPRESSION: 1. Stable mild cardiomegaly without overt pulmonary edema. 2. Curvilinear bibasilar lung opacities, favor mild atelectasis.   Electronically Signed   By: Ilona Sorrel M.D.   On: 02/22/2015 07:27   I have personally reviewed and evaluated  these images and lab results as part of my medical decision-making.   EKG Interpretation   Date/Time:  Saturday February 22 2015 93:81:01 EDT Ventricular Rate:  97 PR Interval:  143 QRS Duration: 104 QT Interval:  422 QTC Calculation: 536 R Axis:   -69 Text Interpretation:  Sinus rhythm Multiform ventricular premature  complexes Probable anterior infarct, age indeterminate Prolonged QT  interval When compared with ECG of 12/25/2014, QT has lengthened Confirmed  by [facility]  MD, Leigh Kaeding ([id number]) on 02/22/2015 7:05:35 AM      CRITICAL CARE Performed by: HENID,POEUM Total critical care time: 80 minutes Critical care time was exclusive of separately billable procedures and treating other patients. Critical care was necessary to treat or prevent imminent or life-threatening deterioration. Critical care was time spent personally by me on the following activities: development of treatment plan with patient and/or surrogate as well as nursing, discussions with consultants, evaluation of patient's response to treatment, examination of patient, obtaining history from patient or surrogate, ordering and performing treatments and interventions, ordering and review of laboratory studies, ordering and review of radiographic studies, pulse oximetry and re-evaluation of patient's condition. MDM   Final diagnoses:  COPD exacerbation ([place])  Elevated pCO2 on arterial blood gas  Renal insufficiency  Elevated troponin I level  Hypernatremia    COPD exacerbation. Chest x-ray will be obtained to rule out pneumonia and he'll be given additional albuterol with ipratropium. He does have history of heart failure and BNP level will be checked. Because of somnolence, will check ABG to make sure he is not becoming hypercarbic. Old records are reviewed and he does have prior ED visits for COPD exacerbation with occasional admissions.  Chest x-ray shows no evidence of pneumonia or pulmonary edema. After albuterol with  ipratropium,  lungs sound clear but patient is still somewhat somnolent. ABG is pending.  ABG shows what appears to be compensated respiratory acidosis. On old records, serum CO2 level was 39 suggesting that he is a chronic carbon dioxide retainer. However, he is still somewhat somnolent. Troponin is mildly elevated and decision is made to admit him. Is given dose of methylprednisolone. Of note, BNP level has come down from previous value and it did not feel that CHF is a significant component of his current symptoms. Case is discussed with Imogene Burn of [facility] who agrees to admit the patient.  Delora Fuel, MD 11/25/17 [id number]

## 2015-02-22 NOTE — ED Notes (Signed)
Per [facility] - pt from [place] - pt c/o shortness of breath that began approx 0600, per facility staff pt was wheezing and administered proventil w/o improvement. Pt w/ hx of dementia however remains currently at mental baseline status - pt speaking sentences on arrival.

## 2015-02-22 NOTE — Progress Notes (Signed)
ANTICOAGULATION CONSULT NOTE - Initial Consult  Pharmacy Consult for coumadin  Indication: atrial fibrillation  Allergies  Allergen Reactions  . Codeine Other (See Comments)    Reaction during surgical  Procedure-unknown  . Morphine And Related Other (See Comments)    Reaction unknown-during surgical procedure    Patient Measurements: Height: 5\' 7"  (170.2 cm) Weight: 165 lb 6.4 oz (75.025 kg) IBW/kg (Calculated) : 66.1 Heparin Dosing Weight:   Vital Signs: Temp: 97.7 F (36.5 C) (10/08 1500) Temp Source: Oral (10/08 1500) BP: 126/75 mmHg (10/08 1500) Pulse Rate: 78 (10/08 1500)  Labs:  Recent Labs  02/22/15 0702 02/22/15 1600  HGB 15.9  --   HCT 48.5  --   PLT 111*  --   LABPROT  --  35.0*  INR  --  3.58*  CREATININE 1.34* 1.33*  TROPONINI 0.09* 0.07*    Estimated Creatinine Clearance: 42.1 mL/min (by C-G formula based on Cr of 1.33).   Medical History: Past Medical History  Diagnosis Date  . Coronary disease     WITH REMOTE ANTERIOR MYOCARDIAL INFARCTION  . Mural thrombus of heart (HCC)     CHRONIC  . Hypertension   . Hyperlipidemia   . CHF (congestive heart failure) ([place])   . Dementia   . A-fib ([place])   . COPD (chronic obstructive pulmonary disease) ([place])   . Shortness of breath dyspnea     Medications:  Scheduled:  . [START ON 02/23/2015] azithromycin  250 mg Oral Daily  . cefTRIAXone (ROCEPHIN)  IV  1 g Intravenous Q24H  . diclofenac sodium  2 g Topical BID  . [START ON 02/23/2015] digoxin  0.25 mg Oral Daily  . donepezil  5 mg Oral QHS  . doxazosin  2 mg Oral Daily  . ezetimibe-simvastatin  1 tablet Oral QHS  . guaiFENesin  1,200 mg Oral BID  . levalbuterol  0.63 mg Nebulization Q6H  . methylPREDNISolone (SOLU-MEDROL) injection  60 mg Intravenous Q12H  . metoprolol succinate  50 mg Oral Daily  . mirtazapine  30 mg Oral QHS  . nicotine  21 mg Transdermal Daily  . pantoprazole  40 mg Oral Q1200  . saccharomyces boulardii  250 mg Oral BID  .  sodium chloride  3 mL Intravenous Q12H  . Tiotropium Bromide-Olodaterol  2 puff Inhalation Daily   Infusions:  . sodium chloride 50 mL/hr at 02/22/15 1527    Assessment: 81 yo who was admitted for SOB. He is on coumadin PTA for afib and mural thrombus. He was started on abx for COPD exacerbation. Coumadin has been ordered to be continued here. Baseline INR is 3.58 so we'll hold the dose today.   Goal of Therapy:  INR 2-3 Monitor platelets by anticoagulation protocol: Yes   Plan:   No coumadin today Daily INR  Onnie Boer, PharmD Pager: [phone] 02/22/2015 5:32 PM

## 2015-02-22 NOTE — Evaluation (Signed)
Clinical/Bedside Swallow Evaluation Patient Details  Name: Jay Guzman MRN: [id number] Date of Birth: Feb 14, 1933  Today's Date: 02/22/2015 Time: SLP Start Time (ACUTE ONLY): 1455 SLP Stop Time (ACUTE ONLY): 1516 SLP Time Calculation (min) (ACUTE ONLY): 21 min  Past Medical History:  Past Medical History  Diagnosis Date  . Coronary disease     WITH REMOTE ANTERIOR MYOCARDIAL INFARCTION  . Mural thrombus of heart (HCC)     CHRONIC  . Hypertension   . Hyperlipidemia   . CHF (congestive heart failure) ([place])   . Dementia   . A-fib ([place])   . COPD (chronic obstructive pulmonary disease) ([place])    Past Surgical History:  Past Surgical History  Procedure Laterality Date  . Cardiac catheterization  2000  . Hernia repair      STATUS POST BILATERAL  . Abdominal aortic aneurysm repair  2008   HPI:  81 y.o. male, with COPD, dementia, a mural thrombus of the heart, systolic heart failure and atrial fibrillation presents to the ER from [facility] for SOB and change in mental status. Jay Guzman is very sleepy and somewhat lethargic so his son, Jay Guzman, provides the history. Per Jay Guzman for the past week the patient has not been eating well. Jay Guzman has noticed increasing weakness in his father particularly in the afternoon/evening hours over the last few days. This morning at 4:30 am Jay Guzman received a call that his father's oxygen sats were low and he was being transferred to [facility] ER. Per the patient he is having difficulty breathing, but denies chest pain. He has had an occasional cough (bringing up green sputum per son). Patient repeatedly falls back to sleep after giving one word answers to questions - per son patient is normally awake and alert at this time of day.   Assessment / Plan / Recommendation Clinical Impression   Pt did not exhibit any s/s of aspiration with various consistencies including thin via straw/cup-->solids; pt did exhibit impaired mastication d/t missing  dentition; oropharyngeal swallow appears WFL with the exception of mastication.  Recommend Dysphagia 3/thin d/t cognitive impairment paired with masticatory issues.  ST to f/u for diet tolerance and possible upgrade to Regular/thin. CXR on 02/22/15 indicated Curvilinear bibasilar lung opacities, favor mild atelectasis.    Aspiration Risk  Mild    Diet Recommendation Dysphagia 3 (Mech soft);Thin   Medication Administration: Whole meds with liquid Compensations: Slow rate;Small sips/bites    Other  Recommendations Oral Care Recommendations: Oral care BID   Follow Up Recommendations       Frequency and Duration min 2x/week  1 week   Pertinent Vitals/Pain WDL    SLP Swallow Goals  See POC   Swallow Study Prior Functional Status   Resident at SNF    General Date of Onset: 02/22/15 Other Pertinent Information: 81 y.o. male, with COPD, dementia, a mural thrombus of the heart, systolic heart failure and atrial fibrillation presents to the ER from [place] for SOB and change in mental status. Jay Guzman is very sleepy and somewhat lethargic so his son, Jay Guzman, provides the history. Per Jay Guzman for the past week the patient has not been eating well. Jay Guzman has noticed increasing weakness in his father particularly in the afternoon/evening hours over the last few days. This morning at 4:30 am Jay Guzman received a call that his father's oxygen sats were low and he was being transferred to [facility] ER. Per the patient he is having difficulty breathing, but denies chest pain. He has had an occasional  cough (bringing up green sputum per son). Patient repeatedly falls back to sleep after giving one word answers to questions - per son patient is normally awake and alert at this time of day. Type of Study: Bedside swallow evaluation Diet Prior to this Study: Information not available;Other (Comment) (diet pending BSE) Temperature Spikes Noted: No Respiratory Status: Room air History of Recent  Intubation: No Behavior/Cognition: Alert;Cooperative;Lethargic/Drowsy;Other (Comment) (pt increased LOA when given food/liquids) Oral Cavity - Dentition: Missing dentition;Other (Comment) (upper dentures only) Self-Feeding Abilities: Needs assist Patient Positioning: Upright in bed Baseline Vocal Quality: Low vocal intensity Volitional Cough: Strong Volitional Swallow: Able to elicit    Oral/Motor/Sensory Function Overall Oral Motor/Sensory Function: Appears within functional limits for tasks assessed   Ice Chips Ice chips: Within functional limits Presentation: Spoon   Thin Liquid Thin Liquid: Within functional limits Presentation: Cup;Straw    Nectar Thick Nectar Thick Liquid: Not tested   Honey Thick Honey Thick Liquid: Not tested   Puree Puree: Within functional limits Presentation: Spoon   Solid       Solid: Impaired Presentation: Spoon Oral Phase Impairments: Impaired mastication       ADAMS,PAT, M.S., CCC-SLP 02/22/2015,3:25 PM

## 2015-02-22 NOTE — ED Notes (Signed)
Pt family member reporting that the patient is unable to speak. This RN to bedside. Pt reports he cannot speak by shaking his head, CBG checked and is 116 and pt becomes verbal. Able to tell me the month and birthday. Primary nurse aware. Pt has hx of dementia.

## 2015-02-22 NOTE — ED Notes (Signed)
Admitting PA at bedside.

## 2015-02-22 NOTE — H&P (Signed)
[facility] History and Physical                                                                                    Jay Guzman, is a 81 y.o. male  MRN: [id number]   DOB - [birth year]  Admit Date - 02/22/2015  Outpatient Primary MD for the patient is Odette Fraction, MD  Referring Physician:  Dr. Roxanne Mins  Chief Complaint:   Chief Complaint  Patient presents with  . Shortness of Breath     HPI  Jay Guzman  is a 81 y.o. male, with COPD, dementia, a mural thrombus of the heart, systolic heart failure and atrial fibrillation presents to the ER from [facility] for SOB and change in mental status.  Mr. Gohman is very sleepy and somewhat lethargic so his son, Pilar Plate, provides the history.  Per Pilar Plate for the past week the patient has not been eating well.  Pilar Plate has noticed increasing weakness in his father particularly in the afternoon/evening hours over the last few days.  This morning at 4:30 am Pilar Plate received a call that his father's oxygen sats were low and he was being transferred to [facility] ER.  Per the patient he is having difficulty breathing, but denies chest pain.  He has had an occasional cough (bringing up green sputum per son).  Patient repeatedly falls back to sleep after giving one word answers to questions - per son patient is normally awake and alert at this time of day.  In the ER patient 's troponin is 0.09, sodium is 149, CO2 34, BNP is 690.8, but this is improved compared to 1 month ago.  Patient's weight is 158 (on the low side).  CXR shows no acute abnormalities.  U/A, LFTs, ammonia level are pending.  Review of Systems   In addition to the HPI above, patient is unable to give a ROS due to lethargy.    Past Medical History  Past Medical History  Diagnosis Date  . Coronary disease     WITH REMOTE ANTERIOR MYOCARDIAL INFARCTION  . Mural thrombus of heart (HCC)     CHRONIC  . Hypertension   . Hyperlipidemia   . CHF (congestive heart failure) ([place])    . Dementia   . A-fib [facility])     Past Surgical History  Procedure Laterality Date  . Cardiac catheterization  2000  . Hernia repair      STATUS POST BILATERAL  . Abdominal aortic aneurysm repair  2008      Social History Social History  Substance Use Topics  . Smoking status: Former Smoker -- 1.00 packs/day for 60 years    Types: Cigarettes  . Smokeless tobacco: Not on file  . Alcohol Use: No  Lives at SNF.  Married, wife at home with son and also has dementia.  Family History Family History  Problem Relation Age of Onset  . Cerebral aneurysm Father   . Cancer Sister   . Cancer Mother     Prior to Admission medications   Medication Sig Start Date End Date Taking? Authorizing Provider  Asenapine Maleate (SAPHRIS) 10 MG SUBL Place 10 mg under the tongue 2 (two) times  daily.   Yes Historical Provider, MD  bisacodyl (DULCOLAX) 10 MG suppository Place 10 mg rectally as needed for moderate constipation.   Yes Historical Provider, MD  diclofenac sodium (VOLTAREN) 1 % GEL Apply 2 g topically 2 (two) times daily.   Yes Historical Provider, MD  digoxin (LANOXIN) 0.25 MG tablet Take 1 tablet (0.25 mg total) by mouth daily. 01/30/15  Yes Orson Eva, MD  donepezil (ARICEPT) 5 MG tablet Take 5 mg by mouth at bedtime.   Yes Historical Provider, MD  doxazosin (CARDURA) 2 MG tablet Take 1 tablet (2 mg total) by mouth daily. 01/13/15  Yes Peter M Martinique, MD  doxycycline (VIBRA-TABS) 100 MG tablet Take 100 mg by mouth 2 (two) times daily. Start 02/21/15 finish 02/28/15   Yes Historical Provider, MD  ezetimibe-simvastatin (VYTORIN) 10-40 MG per tablet Take 1 tablet by mouth at bedtime. 01/10/15  Yes Peter M Martinique, MD  furosemide (LASIX) 20 MG tablet Take 20 mg by mouth daily.  02/11/15  Yes Brett Canales, PA-C  magnesium hydroxide (MILK OF MAGNESIA) 400 MG/5ML suspension Take 30 mLs by mouth daily as needed for mild constipation.   Yes Historical Provider, MD  metoprolol succinate (TOPROL-XL) 50  MG 24 hr tablet Take 1 tablet by mouth  daily 07/01/14  Yes Lendon Colonel, NP  mirtazapine (REMERON) 30 MG tablet Take 30 mg by mouth at bedtime.   Yes Historical Provider, MD  nicotine (NICODERM CQ - DOSED IN MG/24 HOURS) 21 mg/24hr patch Place 21 mg onto the skin daily.   Yes Historical Provider, MD  nitroGLYCERIN (NITROSTAT) 0.4 MG SL tablet Place 1 tablet (0.4 mg total) under the tongue every 5 (five) minutes as needed. Patient taking differently: Place 0.4 mg under the tongue every 5 (five) minutes as needed for chest pain.  09/04/13  Yes Peter M Martinique, MD  ondansetron (ZOFRAN) 4 MG tablet Take 4 mg by mouth every 8 (eight) hours as needed for nausea or vomiting.   Yes Historical Provider, MD  PROAIR HFA 108 (90 BASE) MCG/ACT inhaler INHALE 2 PUFFS INTO THE LUNGS EVERY 6 HOURS AS NEEDED FOR WHEEZING OR SHORTNESS OF BREATH. 11/19/14  Yes Susy Frizzle, MD  saccharomyces boulardii (FLORASTOR) 250 MG capsule Take 250 mg by mouth 2 (two) times daily. Start 02/21/15, stop 03/03/15   Yes Historical Provider, MD  Sodium Phosphates (RA SALINE ENEMA) 19-7 GM/118ML ENEM Place 1 each rectally as needed (for constipation).   Yes Historical Provider, MD  Tiotropium Bromide-Olodaterol (STIOLTO RESPIMAT) 2.5-2.5 MCG/ACT AERS Inhale 2 puffs into the lungs daily. 10/01/14  Yes Susy Frizzle, MD  traZODone (DESYREL) 50 MG tablet Take 25 mg by mouth at bedtime.    Yes Historical Provider, MD  warfarin (COUMADIN) 7.5 MG tablet Take 7.5 mg (one tablet) on Monday, Wednesday, Friday Patient taking differently: Take 7.5 mg by mouth daily at 6 PM.  01/30/15  Yes Orson Eva, MD  warfarin (COUMADIN) 6 MG tablet Take 6 mg (one tablet) daily on Sunday, Tuesday, Thursday, Saturday Patient not taking: Reported on 02/22/2015 01/30/15   Orson Eva, MD    Allergies  Allergen Reactions  . Codeine Other (See Comments)    Reaction during surgical  Procedure-unknown  . Morphine And Related Other (See Comments)    Reaction  unknown-during surgical procedure    Physical Exam  Vitals  Filed Vitals:   02/22/15 0900 02/22/15 0915 02/22/15 0938 02/22/15 1006  BP: 118/77 142/83  104/54  Pulse:  75  57  Resp: 10 24  18   Weight:   71.668 kg (158 lb)   SpO2:  96%  90%   02/11/15 156 lb 12.8 oz (71.124 kg)  01/30/15 168 lb 6.4 oz (76.386 kg)  01/10/15 172 lb (78.019 kg)          General:  Elderly demented male, lying in bed in NAD, pleasant, Alert and orientated.  Son, Pilar Plate at bedside.  Psych:  Normal affect and insight, Not Suicidal or Homicidal  Neuro:   No F.N deficits, ALL C.Nerves Intact, Strength 5/5 all 4 extremities, Sensation intact all 4 extremities.  ENT:  Ears and Eyes appear Normal, Conjunctivae clear, PER. Moist oral mucosa without erythema or exudates.  Neck:  Supple, No lymphadenopathy appreciated  Respiratory:  Bilateral wheeze and crackles, No increased work of breathing, productive cough  Cardiac:  Irreg irreg, No Murmurs, no JVD, ++Bilateral LEE 1-2+  Abdomen:  Positive bowel sounds, Soft, Non tender, Non distended,  No masses appreciated  Skin:  No Cyanosis, Normal Skin Turgor, No Skin Rash or Bruise.  Extremities:  Able to move all 4. 5/5 strength in each,  no effusions.  Data Review  CBC  Recent Labs Lab 02/22/15 0702  WBC 9.3  HGB 15.9  HCT 48.5  PLT 111*  MCV 102.1*  MCH 33.5  MCHC 32.8  RDW 15.8*  LYMPHSABS 1.5  MONOABS 0.8  EOSABS 0.2  BASOSABS 0.0    Chemistries   Recent Labs Lab 02/22/15 0702  NA 149*  K 4.1  CL 106  CO2 34*  GLUCOSE 110*  BUN 29*  CREATININE 1.34*  CALCIUM 9.1     Cardiac Enzymes  Recent Labs Lab 02/22/15 0702  TROPONINI 0.09*     Urinalysis    Component Value Date/Time   COLORURINE AMBER* 01/23/2015 2121   APPEARANCEUR CLOUDY* 01/23/2015 2121   LABSPEC 1.022 01/23/2015 2121   PHURINE 5.0 01/23/2015 2121   GLUCOSEU NEGATIVE 01/23/2015 2121   HGBUR SMALL* 01/23/2015 2121   BILIRUBINUR SMALL*  01/23/2015 2121   [place] NEGATIVE 01/23/2015 2121   PROTEINUR 30* 01/23/2015 2121   UROBILINOGEN 0.2 01/23/2015 2121   NITRITE NEGATIVE 01/23/2015 2121   LEUKOCYTESUR SMALL* 01/23/2015 2121    Imaging results:   Dg Chest 2 View  01/23/2015   CLINICAL DATA:  Possibly urinary tract infection with confusion  EXAM: CHEST  2 VIEW  COMPARISON:  12/25/2014  FINDINGS: COPD with cardiac enlargement. These findings are stable. Vascular pattern normal. Bilateral lower lobe interstitial infiltrates new from prior study. Small bilateral effusions.  IMPRESSION: Bilateral lower lobe interstitial infiltrates right greater than left concerning for pneumonia.Mild pulmonary edema considered less likely.   Electronically Signed   By: Skipper Cliche M.D.   On: 01/23/2015 19:20   Dg Chest Port 1 View  02/22/2015   CLINICAL DATA:  Dyspnea.  EXAM: PORTABLE CHEST 1 VIEW  COMPARISON:  01/23/2015 chest radiograph  FINDINGS: Stable cardiomediastinal silhouette with mild cardiomegaly. No pneumothorax. No pleural effusion. There is cephalization of the pulmonary vasculature without overt pulmonary edema. Curvilinear opacities at the lung bases likely represent atelectasis.  IMPRESSION: 1. Stable mild cardiomegaly without overt pulmonary edema. 2. Curvilinear bibasilar lung opacities, favor mild atelectasis.   Electronically Signed   By: Ilona Sorrel M.D.   On: 02/22/2015 07:27    My personal review of EKG: Sinus rhythm.  Prolonged QT.   Assessment & Plan  Principal Problem:   COPD exacerbation ([place]) Active Problems:   Elevated troponin  Lethargy   Acute hypernatremia   Hypertensive heart disease with CHF (HCC)   Hyperlipidemia   Chronic combined systolic and diastolic heart failure, NYHA class 2 (HCC)   Dementia   Persistent atrial fibrillation (HCC)   Dehydration   Prolonged Q-T interval on ECG   COPD exacerbation  Evident on exam.  Patient with wheeze / crackles, productive cough with green sputum.    Will re-xray in am after gentle hydration to determine if he actually has pneumonia.  Breathing symptoms started acutely per son and may be aspiration. Will tx with antibiotics (azith/rocephin rather than levaquin due to QT prolongation), solumedrol, nebs, flutter valve, PPI. Oxygen PRN to maintain sats of 88-92%.   Will repeat a CXR in am to see if pna appears after hydration.  Elevated troponin Possibly due to demand.  No complaints of CP.  No ST elevation on EKG.  Will cycle troponin. Will not place on aspirin as patient is already on Coumadin.  Hypernatremia (149) with dehydration. Likely due to poor PO intake.  Will hold lasix and give slow NS IVF for 12 hours.   Recheck am BMET  Lethargy / change in mental status Possibly progressive dementia combined with no rest last night.  Could also be medications or infection. Checking U/A, Ammonia level, Digoxin level.  Will hold Saphiris and trazodone for now.  If no improvement would consider holding remeron as well.   Atrial Fibrillation / Mural thrombus Rate Controlled.  Continue coumadin and metoprolol.  Systolic & Diastolic Heart Failure Stable.  Holding lasix due to Hypernatremia.  Continue metoprolol, digoxin. Most recent echo April 2016:   Impressions:  - Mild LVH with LVEF approximately 45%, akinesis noted of the mid to distal anteroseptal, anterior, and inferior walls as well as apex consistent with ischemic cardiomyopathy. History of chronic LV mural thrombus described - not well visualized with this study.   Grade 1 diastolic dysfunction. MAC with mild mitral regurgitation. Sclerotic aortic valve with mild aortic regurgitation. Mild tricuspid regurgitation with PASP 53 mmHg.  HLD Continue Statin.    Consultants Called:  Will discuss PM consult with Son.  Family Communication:  Son, Pilar Plate at bedside.  Code Status:   Partial.  DNI, but will accept ACLS/Chest compressions.  Condition:  Guarded.  Potential  Disposition: To SNF.  Time spent in minutes : [place],  [place] on 02/22/2015 at 10:44 AM Between 7am to 7pm - Pager - [phone] After 7pm go to [email or web address] - password [email or web address] And look for the night coverage person covering me after hours

## 2015-02-23 ENCOUNTER — Inpatient Hospital Stay (HOSPITAL_COMMUNITY): Payer: Medicare Other

## 2015-02-23 DIAGNOSIS — I481 Persistent atrial fibrillation: Secondary | ICD-10-CM

## 2015-02-23 DIAGNOSIS — J441 Chronic obstructive pulmonary disease with (acute) exacerbation: Principal | ICD-10-CM

## 2015-02-23 DIAGNOSIS — F0390 Unspecified dementia without behavioral disturbance: Secondary | ICD-10-CM

## 2015-02-23 DIAGNOSIS — I5042 Chronic combined systolic (congestive) and diastolic (congestive) heart failure: Secondary | ICD-10-CM

## 2015-02-23 LAB — BASIC METABOLIC PANEL
ANION GAP: 10 (ref 5–15)
BUN: 32 mg/dL — ABNORMAL HIGH (ref 6–20)
CALCIUM: 8.6 mg/dL — AB (ref 8.9–10.3)
CO2: 30 mmol/L (ref 22–32)
Chloride: 104 mmol/L (ref 101–111)
Creatinine, Ser: 1.11 mg/dL (ref 0.61–1.24)
GFR calc non Af Amer: >60 mL/min (ref >60)
Glucose, Bld: 153 mg/dL — ABNORMAL HIGH (ref 65–99)
Potassium: 4.4 mmol/L (ref 3.5–5.1)
Sodium: 144 mmol/L (ref 135–145)

## 2015-02-23 LAB — PROTIME-INR
INR: 3.8 — AB (ref 0.00–1.49)
Prothrombin Time: 36.6 seconds — ABNORMAL HIGH (ref 11.6–15.2)

## 2015-02-23 LAB — GLUCOSE, CAPILLARY
GLUCOSE-CAPILLARY: 165 mg/dL — AB (ref 65–99)
GLUCOSE-CAPILLARY: 139 mg/dL — AB (ref 65–99)
GLUCOSE-CAPILLARY: 108 mg/dL — AB (ref 65–99)
Glucose-Capillary: 137 mg/dL — ABNORMAL HIGH (ref 65–99)

## 2015-02-23 LAB — URINALYSIS, ROUTINE W REFLEX MICROSCOPIC
Glucose, UA: 250 mg/dL — AB
Hgb urine dipstick: NEGATIVE
Ketones, ur: 15 mg/dL — AB
NITRITE: NEGATIVE
Protein, ur: NEGATIVE mg/dL
SPECIFIC GRAVITY, URINE: 1.028 (ref 1.005–1.030)
UROBILINOGEN UA: 1 mg/dL (ref 0.0–1.0)
pH: 5.5 (ref 5.0–8.0)

## 2015-02-23 LAB — TROPONIN I: TROPONIN I: 0.08 ng/mL — AB (ref <0.031)

## 2015-02-23 LAB — MRSA PCR SCREENING: MRSA by PCR: NEGATIVE

## 2015-02-23 LAB — URINE MICROSCOPIC-ADD ON

## 2015-02-23 MED ORDER — FUROSEMIDE 20 MG PO TABS
20.0000 mg | ORAL_TABLET | Freq: Every day | ORAL | Status: DC
  Administered 2015-02-24 – 2015-02-25 (×2): 20 mg via ORAL
  Filled 2015-02-23 (×2): qty 1

## 2015-02-23 NOTE — Progress Notes (Signed)
[facility] PROGRESS NOTE  Jay Guzman WJX:[id number] DOB: 03-20-1933 DOA: 02/22/2015  PCP: Odette Fraction, MD  Brief HPI: 81 year old Caucasian male with a past medical history of COPD, dementia, chronic systolic congestive heart failure, atrial fibrillation, presented with shortness of breath and altered mental status. Patient was found to have acute COPD exacerbation. He was hospitalized for further management.  Past medical history:  Past Medical History  Diagnosis Date  . Coronary disease     WITH REMOTE ANTERIOR MYOCARDIAL INFARCTION  . Mural thrombus of heart (HCC)     CHRONIC  . Hypertension   . Hyperlipidemia   . CHF (congestive heart failure) ([place])   . Dementia   . A-fib ([place])   . COPD (chronic obstructive pulmonary disease) ([place])   . Shortness of breath dyspnea     Consultants: None  Procedures: None  Antibiotics: Ceftriaxone and azithromycin 10/8  Subjective: Patient feels well this morning. Slightly distracted. Denies any chest pain, nausea, vomiting. Breathing is improved.  Objective: Vital Signs  Filed Vitals:   02/22/15 1500 02/22/15 2109 02/23/15 0119 02/23/15 0519  BP: 126/75 114/61  114/60  Pulse: 78 69  68  Temp: 97.7 F (36.5 C) 97.7 F (36.5 C)  97.9 F (36.6 C)  TempSrc: Oral Oral  Oral  Resp: 18 18  16   Height: 5\' 7"  (1.702 m)     Weight: 75.025 kg (165 lb 6.4 oz)   76.204 kg (168 lb)  SpO2: 95% 94% 96% 97%    Intake/Output Summary (Last 24 hours) at 02/23/15 1157 Last data filed at 02/23/15 0900  Gross per 24 hour  Intake   1430 ml  Output    350 ml  Net   1080 ml   Filed Weights   02/22/15 0938 02/22/15 1500 02/23/15 0519  Weight: 71.668 kg (158 lb) 75.025 kg (165 lb 6.4 oz) 76.204 kg (168 lb)    General appearance: alert, cooperative, appears stated age, distracted and no distress Resp: Coarse Breath sounds bilaterally with few scattered wheezes. No definite crackles. No rhonchi Cardio: regular rate and  rhythm, S1, S2 normal, no murmur, click, rub or gallop GI: soft, non-tender; bowel sounds normal; no masses,  no organomegaly Extremities: extremities normal, atraumatic, no cyanosis or edema Neurologic: Alert. Oriented to place, person, month. Slightly confused about year. No facial asymmetry. Motor strength is equal bilateral upper and lower extremities.   Lab Results:  Basic Metabolic Panel:  Recent Labs Lab 02/22/15 0702 02/22/15 1600 02/23/15 0300  NA 149*  --  144  K 4.1  --  4.4  CL 106  --  104  CO2 34*  --  30  GLUCOSE 110*  --  153*  BUN 29*  --  32*  CREATININE 1.34* 1.33* 1.11  CALCIUM 9.1  --  8.6*   Liver Function Tests:  Recent Labs Lab 02/22/15 1037  AST 22  ALT 19  ALKPHOS 49  BILITOT 1.9*  PROT 5.3*  ALBUMIN 3.2*    Recent Labs Lab 02/22/15 1038  AMMONIA 18   CBC:  Recent Labs Lab 02/22/15 0702  WBC 9.3  NEUTROABS 6.7  HGB 15.9  HCT 48.5  MCV 102.1*  PLT 111*   Cardiac Enzymes:  Recent Labs Lab 02/22/15 0702 02/22/15 1600 02/22/15 2111 02/23/15 0300  TROPONINI 0.09* 0.07* 0.06* 0.08*   BNP (last 3 results)  Recent Labs  08/26/14 1810 01/23/15 1943 02/22/15 0702  BNP 656.0* 1033.2* 690.8*   CBG:  Recent Labs Lab  02/22/15 1230 02/22/15 2121 02/23/15 0645  GLUCAP 116* 178* 137*    No results found for this or any previous visit (from the past 240 hour(s)).    Studies/Results: X-ray Chest Pa And Lateral  02/23/2015   CLINICAL DATA:  Followup pneumonia. Chronic obstructive lung disease.  EXAM: CHEST  2 VIEW  COMPARISON:  02/22/2015 and previous  FINDINGS: The heart is enlarged. There is atherosclerosis of the aorta. There is a worsened appearance of interstitial markings and patchy lung density bilaterally. The findings could go along with fluid overload/early edema, but patchy bilateral bronchopneumonia is possible. There may be a small amount of pleural fluid in the posterior costophrenic angles.  IMPRESSION:  Background pattern of chronic lung disease with a worsened appearance of interstitial markings bilaterally that could go along with edema or patchy bilateral bronchopneumonia.   Electronically Signed   By: Nelson Chimes M.D.   On: 02/23/2015 09:09   Dg Chest Port 1 View  02/22/2015   CLINICAL DATA:  Dyspnea.  EXAM: PORTABLE CHEST 1 VIEW  COMPARISON:  01/23/2015 chest radiograph  FINDINGS: Stable cardiomediastinal silhouette with mild cardiomegaly. No pneumothorax. No pleural effusion. There is cephalization of the pulmonary vasculature without overt pulmonary edema. Curvilinear opacities at the lung bases likely represent atelectasis.  IMPRESSION: 1. Stable mild cardiomegaly without overt pulmonary edema. 2. Curvilinear bibasilar lung opacities, favor mild atelectasis.   Electronically Signed   By: Ilona Sorrel M.D.   On: 02/22/2015 07:27    Medications:  Scheduled: . azithromycin  250 mg Oral Daily  . cefTRIAXone (ROCEPHIN)  IV  1 g Intravenous Q24H  . diclofenac sodium  2 g Topical BID  . digoxin  0.25 mg Oral Daily  . donepezil  5 mg Oral QHS  . doxazosin  2 mg Oral Daily  . ezetimibe-simvastatin  1 tablet Oral QHS  . [START ON 02/24/2015] furosemide  20 mg Oral Daily  . guaiFENesin  1,200 mg Oral BID  . levalbuterol  0.63 mg Nebulization Q6H  . methylPREDNISolone (SOLU-MEDROL) injection  60 mg Intravenous Q12H  . metoprolol succinate  50 mg Oral Daily  . mirtazapine  30 mg Oral QHS  . nicotine  21 mg Transdermal Daily  . pantoprazole  40 mg Oral Q1200  . saccharomyces boulardii  250 mg Oral BID  . sodium chloride  3 mL Intravenous Q12H  . Tiotropium Bromide-Olodaterol  2 puff Inhalation Daily  . Warfarin - Pharmacist Dosing Inpatient   Does not apply q1800   Continuous:  CNO:[id number] **OR** acetaminophen, alum & mag hydroxide-simeth, bisacodyl, magnesium hydroxide, nitroGLYCERIN, senna-docusate, sodium phosphate  Assessment/Plan:  Principal Problem:   COPD exacerbation  (HCC) Active Problems:   Hypertensive heart disease with CHF (HCC)   Hyperlipidemia   Chronic combined systolic and diastolic heart failure, NYHA class 2 (HCC)   Dementia   Persistent atrial fibrillation (HCC)   Dehydration   Elevated troponin   Lethargy   Prolonged Q-T interval on ECG   Acute hypernatremia    Acute COPD exacerbation  Patient appears to have improved. Wheezing is less. He is breathing better. Continue steroids, nebulizer treatments and antibiotics. Oxygen as needed. Chest x-ray this morning shows chronic changes. Clinically, he does not have any evidence for pulmonary edema.   Elevated troponin Only minimally elevated. Possibly due to demand. No complaints of CP. No ischemic changes on EKG.  Hypernatremia with dehydration. Likely due to poor PO intake.His Lasix was held. He was given gentle IV hydration. Sodium has improved.  Lethargy / change in mental status Possibly progressive dementia combined with no rest night prior to hospitalization.Seems to be improved this morning. Baseline is not clearly known. No evidence for different etiology. Continue to monitor for now.   Atrial Fibrillation / Mural thrombus Rate Controlled. Continue coumadin and metoprolol.  Chronic Systolic & Diastolic Heart Failure Stable.Holding lasix due to Hypernatremia.We'll resume tomorrow morning. Continue metoprolol, digoxin. Most recent echocardiogram from April showed an ejection fraction of 45%. Diffuse wall motion abnormalities noted.  HLD Continue Statin.   DVT Prophylaxis: On warfarin    Code Status: Partial code  Family Communication: No family at bedside. Discussed with the son, [name]. Disposition Plan: Patient presents from [place]. Family requesting change to a different facility. Social worker to address.    LOS: 1 day   [facility] Pager [phone] 02/23/2015, 11:57 AM  If 7PM-7AM, please contact night-coverage at [email or web address],  password [facility]

## 2015-02-23 NOTE — Progress Notes (Signed)
Utilization Review Completed.Kerolos Nehme T10/01/2015  

## 2015-02-23 NOTE — Progress Notes (Signed)
ANTICOAGULATION CONSULT NOTE - Follow Up Consult  Pharmacy Consult for warfarin Indication: atrial fibrillation and hx mural thrombus  Allergies  Allergen Reactions  . Codeine Other (See Comments)    Reaction during surgical  Procedure-unknown  . Morphine And Related Other (See Comments)    Reaction unknown-during surgical procedure    Patient Measurements: Height: 5\' 7"  (170.2 cm) Weight: 168 lb (76.204 kg) IBW/kg (Calculated) : 66.1  Vital Signs: Temp: 97.7 F (36.5 C) (10/09 1203) Temp Source: Oral (10/09 1203) BP: 109/61 mmHg (10/09 1203) Pulse Rate: 56 (10/09 1203)  Labs:  Recent Labs  02/22/15 0702 02/22/15 1600 02/22/15 2111 02/23/15 0300  HGB 15.9  --   --   --   HCT 48.5  --   --   --   PLT 111*  --   --   --   LABPROT  --  35.0*  --  36.6*  INR  --  3.58*  --  3.80*  CREATININE 1.34* 1.33*  --  1.11  TROPONINI 0.09* 0.07* 0.06* 0.08*    Estimated Creatinine Clearance: 50.5 mL/min (by C-G formula based on Cr of 1.11).   Assessment: 81 y/o male admitted from SNF with SOB. He takes chronic warfarin for Afib and mural thrombus. INR remains SUPRAtherapeutic at 3.8 today. No bleeding noted, no CBC this am.  PTA: 7.5 mg/day  Goal of Therapy:  INR 2-3 Monitor platelets by anticoagulation protocol: Yes   Plan:  - No warfarin tonight - Daily INR  [facility], Pharm.D., BCPS Clinical Pharmacist Pager: [phone] 02/23/2015 12:58 PM

## 2015-02-23 NOTE — Evaluation (Signed)
Physical Therapy Evaluation Patient Details Name: Jay Guzman MRN: [id number] DOB: 1932/08/24 Today's Date: 02/23/2015   History of Present Illness  Patient is a 81 yo male admitted 02/22/15 with SOB and AMS.  Patient with COPD exacerbation, dehydration.   PMH:  Dementia, COPD, CHF, Afib, CAD, MI, HTN  Clinical Impression  Patient presents with problems listed below.  Will benefit from acute PT to maximize functional mobility prior to discharge.  Recommend patient return to SNF for continued PT at discharge.    Follow Up Recommendations SNF    Equipment Recommendations  None recommended by PT    Recommendations for Other Services       Precautions / Restrictions Precautions Precautions: Fall Restrictions Weight Bearing Restrictions: No      Mobility  Bed Mobility Overal bed mobility: Needs Assistance Bed Mobility: Supine to Sit;Sit to Supine     Supine to sit: Min guard Sit to supine: Min assist   General bed mobility comments: Patient able to move to EOB with no physical assist.  Min guard for safety.  Patient reports feeling lightheaded in sitting initially.  Patient required min assist to bring LE's onto bed to return to supine.  Transfers Overall transfer level: Needs assistance Equipment used: Rolling walker (2 wheeled) Transfers: Sit to/from Stand Sit to Stand: Min assist         General transfer comment: Verbal cues for hand placement.  Min assist to rise to standing and for balance.  Patient reports lightheadedness in stance.  Was able to step in place, 10 steps with each foot.  Patient returned to sitting due to continued lightheadedness.  Assist for balance with activity in standing.  Ambulation/Gait                Stairs            Wheelchair Mobility    Modified Rankin (Stroke Patients Only)       Balance Overall balance assessment: Needs assistance Sitting-balance support: No upper extremity supported;Feet supported Sitting  balance-Leahy Scale: Good     Standing balance support: Bilateral upper extremity supported Standing balance-Leahy Scale: Poor                               Pertinent Vitals/Pain Pain Assessment: No/denies pain    Home Living Family/patient expects to be discharged to:: Skilled nursing facility                 Additional Comments: Patient is at [facility].  His wife who also has dementia is living with son (per chart)    Prior Function Level of Independence: Needs assistance         Comments: Patient unable to state what he is working on with PT at [facility].     Hand Dominance   Dominant Hand: Right    Extremity/Trunk Assessment   Upper Extremity Assessment: Generalized weakness           Lower Extremity Assessment: Generalized weakness         Communication   Communication: No difficulties  Cognition Arousal/Alertness: Lethargic Behavior During Therapy: Flat affect Overall Cognitive Status: No family/caregiver present to determine baseline cognitive functioning       Memory: Decreased short-term memory              General Comments      Exercises        Assessment/Plan    PT Assessment  Patient needs continued PT services  PT Diagnosis Difficulty walking;Generalized weakness;Altered mental status   PT Problem List Decreased strength;Decreased activity tolerance;Decreased balance;Decreased mobility;Decreased cognition;Decreased knowledge of use of DME;Cardiopulmonary status limiting activity  PT Treatment Interventions DME instruction;Gait training;Functional mobility training;Therapeutic activities;Therapeutic exercise;Cognitive remediation;Patient/family education   PT Goals (Current goals can be found in the Care Plan section) Acute Rehab PT Goals Patient Stated Goal: To feel better PT Goal Formulation: With patient Time For Goal Achievement: 03/02/15 Potential to Achieve Goals: Good    Frequency Min 2X/week    Barriers to discharge        Co-evaluation               End of Session Equipment Utilized During Treatment: Gait belt;Oxygen Activity Tolerance: Patient limited by fatigue Patient left: in bed;with call bell/phone within reach;with bed alarm set Nurse Communication: Mobility status (Requests assist for meal)         Time: 3748-2707 PT Time Calculation (min) (ACUTE ONLY): 14 min   Charges:   PT Evaluation $Initial PT Evaluation Tier I: 1 Procedure     PT G CodesDespina Pole March 12, 2015, 9:44 AM Carita Pian. Sanjuana Kava, [place] Pager [phone]

## 2015-02-23 NOTE — Progress Notes (Signed)
Utilization Review Completed.Jay Guzman T10/01/2015  

## 2015-02-24 ENCOUNTER — Other Ambulatory Visit: Payer: Self-pay

## 2015-02-24 DIAGNOSIS — R7989 Other specified abnormal findings of blood chemistry: Secondary | ICD-10-CM

## 2015-02-24 LAB — CBC
HEMATOCRIT: 44.7 % (ref 39.0–52.0)
HEMOGLOBIN: 14.4 g/dL (ref 13.0–17.0)
MCH: 32.7 pg (ref 26.0–34.0)
MCHC: 32.2 g/dL (ref 30.0–36.0)
MCV: 101.6 fL — AB (ref 78.0–100.0)
Platelets: 89 10*3/uL — ABNORMAL LOW (ref 150–400)
RBC: 4.4 MIL/uL (ref 4.22–5.81)
RDW: 15.9 % — ABNORMAL HIGH (ref 11.5–15.5)
WBC: 9.7 10*3/uL (ref 4.0–10.5)

## 2015-02-24 LAB — GLUCOSE, CAPILLARY
GLUCOSE-CAPILLARY: 205 mg/dL — AB (ref 65–99)
GLUCOSE-CAPILLARY: 164 mg/dL — AB (ref 65–99)
Glucose-Capillary: 151 mg/dL — ABNORMAL HIGH (ref 65–99)
Glucose-Capillary: 184 mg/dL — ABNORMAL HIGH (ref 65–99)

## 2015-02-24 LAB — BASIC METABOLIC PANEL
Anion gap: 6 (ref 5–15)
BUN: 39 mg/dL — AB (ref 6–20)
CHLORIDE: 104 mmol/L (ref 101–111)
CO2: 31 mmol/L (ref 22–32)
CREATININE: 1.2 mg/dL (ref 0.61–1.24)
Calcium: 8.4 mg/dL — ABNORMAL LOW (ref 8.9–10.3)
GFR calc Af Amer: >60 mL/min (ref >60)
GFR calc non Af Amer: 56 mL/min — ABNORMAL LOW (ref >60)
GLUCOSE: 158 mg/dL — AB (ref 65–99)
Potassium: 4.4 mmol/L (ref 3.5–5.1)
Sodium: 141 mmol/L (ref 135–145)

## 2015-02-24 LAB — TROPONIN I: Troponin I: 0.05 ng/mL — ABNORMAL HIGH (ref <0.031)

## 2015-02-24 LAB — URINE CULTURE

## 2015-02-24 LAB — PROTIME-INR
INR: 2.17 — ABNORMAL HIGH (ref 0.00–1.49)
Prothrombin Time: 24 seconds — ABNORMAL HIGH (ref 11.6–15.2)

## 2015-02-24 LAB — MAGNESIUM: MAGNESIUM: 2.2 mg/dL (ref 1.7–2.4)

## 2015-02-24 MED ORDER — IBUPROFEN 400 MG PO TABS
400.0000 mg | ORAL_TABLET | Freq: Once | ORAL | Status: DC
  Filled 2015-02-24: qty 1

## 2015-02-24 MED ORDER — LEVALBUTEROL HCL 0.63 MG/3ML IN NEBU
0.6300 mg | INHALATION_SOLUTION | Freq: Three times a day (TID) | RESPIRATORY_TRACT | Status: DC
Start: 2015-02-25 — End: 2015-02-25
  Administered 2015-02-25: 0.63 mg via RESPIRATORY_TRACT
  Filled 2015-02-24 (×2): qty 3

## 2015-02-24 MED ORDER — WARFARIN SODIUM 7.5 MG PO TABS
7.5000 mg | ORAL_TABLET | Freq: Every day | ORAL | Status: DC
  Administered 2015-02-24: 7.5 mg via ORAL
  Filled 2015-02-24: qty 1

## 2015-02-24 NOTE — Progress Notes (Signed)
Speech Language Pathology Treatment: Dysphagia  Patient Details Name: Jay Guzman MRN: [id number] DOB: 1933/01/25 Today's Date: 02/24/2015 Time: 8469-6295 SLP Time Calculation (min) (ACUTE ONLY): 9 min  Assessment / Plan / Recommendation Clinical Impression  Pt consumed regular textures and thin liquids with mild delay in mastication, although otherwise WFL. Minimal cueing provided for slower rate of intake with solids to account for prolonged bolus formation. No overt signs of aspiration noted, and pt remains afebrile. Would continue with current diet textures. SLP to sign off.   HPI Other Pertinent Information: 81 y.o. male, with COPD, dementia, a mural thrombus of the heart, systolic heart failure and atrial fibrillation presents to the ER from [facility] for SOB and change in mental status. Jay Guzman is very sleepy and somewhat lethargic so his son, Jay Guzman, provides the history. Per Jay Guzman for the past week the patient has not been eating well. Jay Guzman has noticed increasing weakness in his father particularly in the afternoon/evening hours over the last few days. This morning at 4:30 am Jay Guzman received a call that his father's oxygen sats were low and he was being transferred to [facility] ER. Per the patient he is having difficulty breathing, but denies chest pain. He has had an occasional cough (bringing up green sputum per son). Patient repeatedly falls back to sleep after giving one word answers to questions - per son patient is normally awake and alert at this time of day.   Pertinent Vitals Pain Assessment: No/denies pain  SLP Plan  Continue with current plan of care    Recommendations Diet recommendations: Dysphagia 3 (mechanical soft);Thin liquid Liquids provided via: Cup;Straw Medication Administration: Whole meds with liquid Supervision: Patient able to self feed;Intermittent supervision to cue for compensatory strategies Compensations: Slow rate;Small  sips/bites Postural Changes and/or Swallow Maneuvers: Seated upright 90 degrees       Oral Care Recommendations: Oral care BID Follow up Recommendations: None Plan: Continue with current plan of care      Jay Guzman, M.A. CCC-SLP [phone]  Jay Guzman 02/24/2015, 12:14 PM

## 2015-02-24 NOTE — Clinical Documentation Improvement (Addendum)
Hospitalist   (please document your response in the progress notes, not on the query form itself.)  Query 1 of 2  Possible Clinical Conditions:  - Stage 3 CKD  (please include any cause and/or associated conditions)  - Other condition  - Unable to clinically determine  Clinical Information: "Renal Insufficiency" is documented by the ED provider on admission.   Component     Latest Ref Rng 02/22/2015 02/22/2015 02/23/2015 02/24/2015         7:02 AM  4:00 PM    BUN     6 - 20 mg/dL 29 (H)  32 (H) 39 (H)  Creatinine     0.61 - 1.24 mg/dL 1.34 (H) 1.33 (H) 1.11 1.20  EGFR (Non-African Amer.)     >60 mL/min 49 (L) 49 (L) >60 56 (L)    Query 2 of 2 Possible Clinical Conditions:  - Arteriosclerotic Dementia  - Senile Dementia  - Alzheimer's Dementia  - Other type of dementia or condition  - Unable to Clinically Determine  Clinical Information: Patient has a known history of dementia per current medical record. Medications prior to admission include - Aricept.  Please exercise your independent, professional judgment when responding. A specific answer is not anticipated or expected.   Thank You, Erling Conte  RN BSN CCDS [phone] Health Information Management [place]

## 2015-02-24 NOTE — Evaluation (Signed)
Occupational Therapy Evaluation Patient Details Name: Jay Guzman MRN: [id number] DOB: 1932/11/22 Today's Date: 02/24/2015    History of Present Illness Patient is a 81 yo male admitted 02/22/15 with SOB and AMS.  Patient with COPD exacerbation, dehydration.   PMH:  Dementia, COPD, CHF, Afib, CAD, MI, HTN   Clinical Impression   This 81 yo male admitted with above presents to acute OT with decreased mobility, decreased balance, and generalized weakness all affecting his ability to care for himself. He will benefit from acute OT with follow up OT at SNF to return home with family.    Follow Up Recommendations  SNF    Equipment Recommendations   (TBD at next venue)       Precautions / Restrictions Precautions Precautions: Fall Restrictions Weight Bearing Restrictions: No      Mobility Bed Mobility Overal bed mobility: Needs Assistance Bed Mobility: Supine to Sit     Supine to sit: Min guard        Transfers Overall transfer level: Needs assistance Equipment used: Rolling walker (2 wheeled) Transfers: Sit to/from Stand Sit to Stand: Min guard         General transfer comment: Verbal cues for hand placement.      Balance Overall balance assessment: Needs assistance Sitting-balance support: No upper extremity supported;Feet supported Sitting balance-Leahy Scale: Good     Standing balance support: Bilateral upper extremity supported;During functional activity Standing balance-Leahy Scale: Poor                              ADL Overall ADL's : Needs assistance/impaired Eating/Feeding: Independent;Sitting   Grooming: Wash/dry hands;Min guard;Standing   Upper Body Bathing: Set up;Supervision/ safety;Sitting   Lower Body Bathing: Minimal assistance (with min guard A sit<>stand)   Upper Body Dressing : Set up;Supervision/safety;Sitting   Lower Body Dressing: Moderate assistance (with min guard A sit<>stand)   Toilet Transfer: Min  guard;Ambulation;RW;Comfort height toilet;Grab bars   Toileting- Clothing Manipulation and Hygiene: Minimal assistance;Sit to/from stand               Vision Additional Comments: No change from baseline          Pertinent Vitals/Pain Pain Assessment: No/denies pain     Hand Dominance Right   Extremity/Trunk Assessment Upper Extremity Assessment Upper Extremity Assessment: Generalized weakness           Communication Communication Communication: No difficulties   Cognition Arousal/Alertness: Awake/alert Behavior During Therapy: Flat affect Overall Cognitive Status: History of cognitive impairments - at baseline                                Home Living Family/patient expects to be discharged to:: Skilled nursing facility                                 Additional Comments: Patient is at [facility].  His wife who also has dementia is living with son with a son      Prior Functioning/Environment Level of Independence: Needs assistance             OT Diagnosis: Generalized weakness   OT Problem List: Decreased strength;Impaired balance (sitting and/or standing);Decreased knowledge of use of DME or AE   OT Treatment/Interventions: Self-care/ADL training;DME and/or AE instruction;Balance training;Patient/family education    OT Goals(Current goals  can be found in the care plan section) Acute Rehab OT Goals OT Goal Formulation: With patient Time For Goal Achievement: 03/03/15 Potential to Achieve Goals: Good  OT Frequency: Min 2X/week   Barriers to D/C: Decreased caregiver support             End of Session Equipment Utilized During Treatment: Gait belt;Rolling walker;Oxygen (2 liters)  Activity Tolerance: Patient tolerated treatment well Patient left: in chair;with call bell/phone within reach;with chair alarm set;with family/visitor present   Time: 0936-1010 OT Time Calculation (min): 34 min Charges:  OT General  Charges $OT Visit: 1 Procedure OT Evaluation $Initial OT Evaluation Tier I: 1 Procedure OT Treatments $Self Care/Home Management : 8-22 mins  Jay Guzman [phone] 02/24/2015, 1:13 PM

## 2015-02-24 NOTE — Progress Notes (Signed)
  ANTICOAGULATION CONSULT NOTE - Follow Up Consult  Pharmacy Consult for Coumadin Indication: atrial fibrillation and hx mural thrombus  Allergies  Allergen Reactions  . Codeine Other (See Comments)    Reaction during surgical  Procedure-unknown  . Morphine And Related Other (See Comments)    Reaction unknown-during surgical procedure    Patient Measurements: Height: 5\' 7"  (170.2 cm) Weight: 167 lb 15.8 oz (76.198 kg) IBW/kg (Calculated) : 66.1  Vital Signs: Temp: 97.6 F (36.4 C) (10/10 1351) Temp Source: Oral (10/10 1351) BP: 111/92 mmHg (10/10 1351) Pulse Rate: 75 (10/10 1351)  Labs:  Recent Labs  02/22/15 0702 02/22/15 1600 02/22/15 2111 02/23/15 0300 02/24/15 0419  HGB 15.9  --   --   --  14.4  HCT 48.5  --   --   --  44.7  PLT 111*  --   --   --  89*  LABPROT  --  35.0*  --  36.6* 24.0*  INR  --  3.58*  --  3.80* 2.17*  CREATININE 1.34* 1.33*  --  1.11 1.20  TROPONINI 0.09* 0.07* 0.06* 0.08*  --     Estimated Creatinine Clearance: 46.7 mL/min (by C-G formula based on Cr of 1.2).  Assessment:    INR is down to 2.17 today, therapeutic.  INR 3.58 on admit 10/8 and up to 3.80 on 10/9.  Last Coumadin dose prior to admission.  INR may drop again by 10/11.    Most recent Coumadin regimen = 7.5 mg daily, but seems to have just recently increased. Previously on 7.5 mg MWF and 6 mg TTSS with INR of 2.16.  Goal of Therapy:  INR 2-3 Monitor platelets by anticoagulation protocol: Yes   Plan:   Resume Coumadin 7.5 mg daily at 6pm for now.  Continue daily PT/INR.  Arty Baumgartner, [place] Pager: [phone] 02/24/2015,3:17 PM

## 2015-02-24 NOTE — Progress Notes (Signed)
[facility] PROGRESS NOTE  Jay Guzman OJJ:[id number] DOB: [birth year] DOA: 02/22/2015  PCP: Odette Fraction, MD  Brief HPI: 81 year old Caucasian male with a past medical history of COPD, dementia, chronic systolic congestive heart failure, atrial fibrillation, presented with shortness of breath and altered mental status. Patient was found to have acute COPD exacerbation. He was hospitalized for further management.  Past medical history:  Past Medical History  Diagnosis Date  . Coronary disease     WITH REMOTE ANTERIOR MYOCARDIAL INFARCTION  . Mural thrombus of heart (HCC)     CHRONIC  . Hypertension   . Hyperlipidemia   . CHF (congestive heart failure) ([place])   . Dementia   . A-fib ([place])   . COPD (chronic obstructive pulmonary disease) ([place])   . Shortness of breath dyspnea     Consultants: None  Procedures: None  Antibiotics: Ceftriaxone and azithromycin 10/8  Subjective: Patient feels much better this morning. Cough is improved. His short of breath. Denies chest pain. Does admit to some coughing spells especially when he is eating and drinking. His son is at bedside.   Objective: Vital Signs  Filed Vitals:   02/23/15 2013 02/23/15 2105 02/24/15 0000 02/24/15 0406  BP:  105/60 116/68 108/59  Pulse:  55 68 68  Temp:  97.4 F (36.3 C) 97.3 F (36.3 C) 97.4 F (36.3 C)  TempSrc:  Oral Oral Oral  Resp:  17 16 17   Height:      Weight:    76.198 kg (167 lb 15.8 oz)  SpO2: 96% 98% 98% 99%    Intake/Output Summary (Last 24 hours) at 02/24/15 0920 Last data filed at 02/24/15 0849  Gross per 24 hour  Intake    698 ml  Output    650 ml  Net     48 ml   Filed Weights   02/22/15 1500 02/23/15 0519 02/24/15 0406  Weight: 75.025 kg (165 lb 6.4 oz) 76.204 kg (168 lb) 76.198 kg (167 lb 15.8 oz)    General appearance: alert, cooperative, appears stated age, distracted and no distress Resp: Much improved air entry bilaterally. Still with Coarse Breath  sounds with few scattered wheezes. No definite crackles. No rhonchi Cardio: regular rate and rhythm, S1, S2 normal, no murmur, click, rub or gallop GI: soft, non-tender; bowel sounds normal; no masses,  no organomegaly Extremities: extremities normal, atraumatic, no cyanosis or edema Neurologic: Alert. Oriented to place, person, month. Slightly confused about year. No facial asymmetry. Motor strength is equal bilateral upper and lower extremities.   Lab Results:  Basic Metabolic Panel:  Recent Labs Lab 02/22/15 0702 02/22/15 1600 02/23/15 0300 02/24/15 0419  NA 149*  --  144 141  K 4.1  --  4.4 4.4  CL 106  --  104 104  CO2 34*  --  30 31  GLUCOSE 110*  --  153* 158*  BUN 29*  --  32* 39*  CREATININE 1.34* 1.33* 1.11 1.20  CALCIUM 9.1  --  8.6* 8.4*   Liver Function Tests:  Recent Labs Lab 02/22/15 1037  AST 22  ALT 19  ALKPHOS 49  BILITOT 1.9*  PROT 5.3*  ALBUMIN 3.2*    Recent Labs Lab 02/22/15 1038  AMMONIA 18   CBC:  Recent Labs Lab 02/22/15 0702 02/24/15 0419  WBC 9.3 9.7  NEUTROABS 6.7  --   HGB 15.9 14.4  HCT 48.5 44.7  MCV 102.1* 101.6*  PLT 111* 89*   Cardiac Enzymes:  Recent  Labs Lab 02/22/15 0702 02/22/15 1600 02/22/15 2111 02/23/15 0300  TROPONINI 0.09* 0.07* 0.06* 0.08*   BNP (last 3 results)  Recent Labs  08/26/14 1810 01/23/15 1943 02/22/15 0702  BNP 656.0* 1033.2* 690.8*   CBG:  Recent Labs Lab 02/23/15 0645 02/23/15 1133 02/23/15 1614 02/23/15 2108 02/24/15 0534  GLUCAP 137* 165* 139* 108* 151*    Recent Results (from the past 240 hour(s))  MRSA PCR Screening     Status: None   Collection Time: 02/23/15  4:17 PM  Result Value Ref Range Status   MRSA by PCR NEGATIVE NEGATIVE Final    Comment:        The GeneXpert MRSA Assay (FDA approved for NASAL specimens only), is one component of a comprehensive MRSA colonization surveillance program. It is not intended to diagnose MRSA infection nor to guide  or monitor treatment for MRSA infections.       Studies/Results: X-ray Chest Pa And Lateral  02/23/2015   CLINICAL DATA:  Followup pneumonia. Chronic obstructive lung disease.  EXAM: CHEST  2 VIEW  COMPARISON:  02/22/2015 and previous  FINDINGS: The heart is enlarged. There is atherosclerosis of the aorta. There is a worsened appearance of interstitial markings and patchy lung density bilaterally. The findings could go along with fluid overload/early edema, but patchy bilateral bronchopneumonia is possible. There may be a small amount of pleural fluid in the posterior costophrenic angles.  IMPRESSION: Background pattern of chronic lung disease with a worsened appearance of interstitial markings bilaterally that could go along with edema or patchy bilateral bronchopneumonia.   Electronically Signed   By: Nelson Chimes M.D.   On: 02/23/2015 09:09    Medications:  Scheduled: . azithromycin  250 mg Oral Daily  . cefTRIAXone (ROCEPHIN)  IV  1 g Intravenous Q24H  . diclofenac sodium  2 g Topical BID  . digoxin  0.25 mg Oral Daily  . donepezil  5 mg Oral QHS  . doxazosin  2 mg Oral Daily  . ezetimibe-simvastatin  1 tablet Oral QHS  . furosemide  20 mg Oral Daily  . guaiFENesin  1,200 mg Oral BID  . ibuprofen  400 mg Oral Once  . levalbuterol  0.63 mg Nebulization Q6H  . methylPREDNISolone (SOLU-MEDROL) injection  60 mg Intravenous Q12H  . metoprolol succinate  50 mg Oral Daily  . mirtazapine  30 mg Oral QHS  . nicotine  21 mg Transdermal Daily  . pantoprazole  40 mg Oral Q1200  . saccharomyces boulardii  250 mg Oral BID  . sodium chloride  3 mL Intravenous Q12H  . Tiotropium Bromide-Olodaterol  2 puff Inhalation Daily  . Warfarin - Pharmacist Dosing Inpatient   Does not apply q1800   Continuous:  ZOX:[id number] **OR** acetaminophen, alum & mag hydroxide-simeth, bisacodyl, magnesium hydroxide, nitroGLYCERIN, senna-docusate, sodium phosphate  Assessment/Plan:  Principal Problem:    COPD exacerbation (HCC) Active Problems:   Hypertensive heart disease with CHF (HCC)   Hyperlipidemia   Chronic combined systolic and diastolic heart failure, NYHA class 2 (HCC)   Dementia   Persistent atrial fibrillation (HCC)   Dehydration   Elevated troponin   Lethargy   Prolonged Q-T interval on ECG   Acute hypernatremia    Acute COPD exacerbation  Patient is improving. Current treatment for now. Continue steroids, nebulizer treatments and antibiotics. Oxygen as needed. Chest x-ray this morning shows chronic changes. Clinically, he does not have any evidence for pulmonary edema.   Elevated troponin Only minimally elevated. Possibly due to  demand. No complaints of CP. No ischemic changes on EKG.echocardiogram from May showed EF of 45-50%. Do not anticipate any workup at this time.  Chronic Systolic & Diastolic Heart Failure/NSVT Stable. Lasix was held at the time of admission due to Hypernatremia.Resumed today. Continue metoprolol, digoxin. Dig level was normal. Most recent echocardiogram from April showed an ejection fraction of 45-50%. Diffuse wall motion abnormalities noted. Nonsustained ventricular tachycardia noted on telemetry this morning. He was asymptomatic. Potassium is normal. Magnesium is normal. Continue beta blocker. Continue to monitor for now. If he has recurrence, and frequent episodes, may need to cardiology input.  Hypernatremia with dehydration/Elevation in creatinine Likely due to poor PO intake.His Lasix was held. He was given gentle IV hydration. Sodium has improved. Unclear if patient has chronic kidney disease.  Acute encephalopathy in the setting of dementia Possibly progressive dementia combined with no rest night prior to hospitalization and acute illness.Patient's mental status is back to baseline.   Atrial Fibrillation / Mural thrombus Rate Controlled.Continue coumadin and metoprolol. He was seen by cardiology recently in September. Plan is for  DC cardioversion in the near future.  HLD Continue Statin.  Dementia, Possibly Alzheimers Stable. At baseline.   DVT Prophylaxis: On warfarin    Code Status: Partial code  Family Communication: Discussed with the son, Pilar Plate at bedside. Disposition Plan: Patient presents from [place]. Family requesting change to a different facility. Social worker to address. Anticipate discharge in 24-48 hours.    LOS: 2 days   [facility] Pager [phone] 02/24/2015, 9:20 AM  If 7PM-7AM, please contact night-coverage at [email or web address], password [facility]

## 2015-02-25 LAB — CBC
HEMATOCRIT: 43.8 % (ref 39.0–52.0)
HEMOGLOBIN: 14.1 g/dL (ref 13.0–17.0)
MCH: 32.7 pg (ref 26.0–34.0)
MCHC: 32.2 g/dL (ref 30.0–36.0)
MCV: 101.6 fL — AB (ref 78.0–100.0)
Platelets: 87 10*3/uL — ABNORMAL LOW (ref 150–400)
RBC: 4.31 MIL/uL (ref 4.22–5.81)
RDW: 15.8 % — ABNORMAL HIGH (ref 11.5–15.5)
WBC: 11.1 10*3/uL — AB (ref 4.0–10.5)

## 2015-02-25 LAB — PROTIME-INR
INR: 2.01 — ABNORMAL HIGH (ref 0.00–1.49)
Prothrombin Time: 22.6 seconds — ABNORMAL HIGH (ref 11.6–15.2)

## 2015-02-25 LAB — GLUCOSE, CAPILLARY: Glucose-Capillary: 140 mg/dL — ABNORMAL HIGH (ref 65–99)

## 2015-02-25 MED ORDER — TRAZODONE HCL 50 MG PO TABS: 25.0000 mg | ORAL_TABLET | Freq: Every evening | ORAL | Status: DC | PRN

## 2015-02-25 MED ORDER — AZITHROMYCIN 250 MG PO TABS: 250.0000 mg | ORAL_TABLET | Freq: Every day | ORAL | Status: DC

## 2015-02-25 MED ORDER — ALBUTEROL SULFATE (2.5 MG/3ML) 0.083% IN NEBU: INHALATION_SOLUTION | RESPIRATORY_TRACT | Status: DC

## 2015-02-25 MED ORDER — PREDNISONE 20 MG PO TABS: ORAL_TABLET | ORAL | Status: DC

## 2015-02-25 MED ORDER — GUAIFENESIN ER 600 MG PO TB12: 1200.0000 mg | ORAL_TABLET | Freq: Two times a day (BID) | ORAL | Status: AC

## 2015-02-25 NOTE — Discharge Instructions (Signed)
Chronic Obstructive Pulmonary Disease Chronic obstructive pulmonary disease (COPD) is a common lung condition in which airflow from the lungs is limited. COPD is a general term that can be used to describe many different lung problems that limit airflow, including both chronic bronchitis and emphysema. If you have COPD, your lung function will probably never return to normal, but there are measures you can take to improve lung function and make yourself feel better. CAUSES   Smoking (common).  Exposure to secondhand smoke.  Genetic problems.  Chronic inflammatory lung diseases or recurrent infections. SYMPTOMS  Shortness of breath, especially with physical activity.  Deep, persistent (chronic) cough with a large amount of thick mucus.  Wheezing.  Rapid breaths (tachypnea).  Gray or bluish discoloration (cyanosis) of the skin, especially in your fingers, toes, or lips.  Fatigue.  Weight loss.  Frequent infections or episodes when breathing symptoms become much worse (exacerbations).  Chest tightness. DIAGNOSIS Your health care provider will take a medical history and perform a physical examination to diagnose COPD. Additional tests for COPD may include:  Lung (pulmonary) function tests.  Chest X-ray.  CT scan.  Blood tests. TREATMENT  Treatment for COPD may include:  Inhaler and nebulizer medicines. These help manage the symptoms of COPD and make your breathing more comfortable.  Supplemental oxygen. Supplemental oxygen is only helpful if you have a low oxygen level in your blood.  Exercise and physical activity. These are beneficial for nearly all people with COPD.  Lung surgery or transplant.  Nutrition therapy to gain weight, if you are underweight.  Pulmonary rehabilitation. This may involve working with a team of health care providers and specialists, such as respiratory, occupational, and physical therapists. HOME CARE INSTRUCTIONS  Take all medicines  (inhaled or pills) as directed by your health care provider.  Avoid over-the-counter medicines or cough syrups that dry up your airway (such as antihistamines) and slow down the elimination of secretions unless instructed otherwise by your health care provider.  If you are a smoker, the most important thing that you can do is stop smoking. Continuing to smoke will cause further lung damage and breathing trouble. Ask your health care provider for help with quitting smoking. He or she can direct you to community resources or hospitals that provide support.  Avoid exposure to irritants such as smoke, chemicals, and fumes that aggravate your breathing.  Use oxygen therapy and pulmonary rehabilitation if directed by your health care provider. If you require home oxygen therapy, ask your health care provider whether you should purchase a pulse oximeter to measure your oxygen level at home.  Avoid contact with individuals who have a contagious illness.  Avoid extreme temperature and humidity changes.  Eat healthy foods. Eating smaller, more frequent meals and resting before meals may help you maintain your strength.  Stay active, but balance activity with periods of rest. Exercise and physical activity will help you maintain your ability to do things you want to do.  Preventing infection and hospitalization is very important when you have COPD. Make sure to receive all the vaccines your health care provider recommends, especially the pneumococcal and influenza vaccines. Ask your health care provider whether you need a pneumonia vaccine.  Learn and use relaxation techniques to manage stress.  Learn and use controlled breathing techniques as directed by your health care provider. Controlled breathing techniques include:  Pursed lip breathing. Start by breathing in (inhaling) through your nose for 1 second. Then, purse your lips as if you were   going to whistle and breathe out (exhale) through the  pursed lips for 2 seconds.  Diaphragmatic breathing. Start by putting one hand on your abdomen just above your waist. Inhale slowly through your nose. The hand on your abdomen should move out. Then purse your lips and exhale slowly. You should be able to feel the hand on your abdomen moving in as you exhale.  Learn and use controlled coughing to clear mucus from your lungs. Controlled coughing is a series of short, progressive coughs. The steps of controlled coughing are: 1. Lean your head slightly forward. 2. Breathe in deeply using diaphragmatic breathing. 3. Try to hold your breath for 3 seconds. 4. Keep your mouth slightly open while coughing twice. 5. Spit any mucus out into a tissue. 6. Rest and repeat the steps once or twice as needed. SEEK MEDICAL CARE IF:  You are coughing up more mucus than usual.  There is a change in the color or thickness of your mucus.  Your breathing is more labored than usual.  Your breathing is faster than usual. SEEK IMMEDIATE MEDICAL CARE IF:  You have shortness of breath while you are resting.  You have shortness of breath that prevents you from:  Being able to talk.  Performing your usual physical activities.  You have chest pain lasting longer than 5 minutes.  Your skin color is more cyanotic than usual.  You measure low oxygen saturations for longer than 5 minutes with a pulse oximeter. MAKE SURE YOU:  Understand these instructions.  Will watch your condition.  Will get help right away if you are not doing well or get worse.   This information is not intended to replace advice given to you by your health care provider. Make sure you discuss any questions you have with your health care provider.   Document Released: 02/10/2005 Document Revised: 05/24/2014 Document Reviewed: 12/28/2012 Elsevier Interactive Patient Education 2016 [employer]  

## 2015-02-25 NOTE — Progress Notes (Signed)
PT Cancellation Note  Patient Details Name: Jay Guzman MRN: [id number] DOB: 10-30-1932   Cancelled Treatment:    Reason Eval/Treat Not Completed: Other (comment); spoke with RN who reports anticipating ambulance transport to SNF any time now.  Will cancel due to d/c imminent.   Jay Guzman,CYNDI 02/25/2015, 2:18 PM  Magda Kiel, [place] 02/25/2015

## 2015-02-25 NOTE — Care Management Important Message (Signed)
Important Message  Patient Details  Name: Jay Guzman MRN: [id number] Date of Birth: 12-Mar-1933   Medicare Important Message Given:  Yes-second notification given    Delorse Lek 02/25/2015, 11:19 AM

## 2015-02-25 NOTE — Progress Notes (Signed)
Patient was transported to [facility] via [facility]. IV removed. Report given to receiving facility. Family aware and updated.

## 2015-02-25 NOTE — Discharge Summary (Signed)
[facility]  Physician Discharge Summary   Patient ID: Jay Guzman MRN: [id number] DOB/AGE: 1932-07-27 81 y.o.  Admit date: 02/22/2015 Discharge date: 02/25/2015  PCP: Jenna Luo TOM, MD  DISCHARGE DIAGNOSES:  Principal Problem:   COPD exacerbation ([place]) Active Problems:   Hypertensive heart disease with CHF ([place])   Hyperlipidemia   Chronic combined systolic and diastolic heart failure, NYHA class 2 (H[facility])   Dementia   Persistent atrial fibrillation ([facility])   Dehydration   Elevated troponin   Lethargy   Prolonged Q-T interval on ECG   Acute hypernatremia   RECOMMENDATIONS FOR OUTPATIENT FOLLOW UP: 1. CBC and BMET next week. 2. PT/INR per SNF protocol. INR to be 2-3. 3. He will need follow up with Dr. Martinique with cardiology for further management of Afib.   DISCHARGE CONDITION: fair  Diet recommendation: Dysphagia 3 with Thin liquids  Filed Weights   02/23/15 0519 02/24/15 0406 02/25/15 0405  Weight: 76.204 kg (168 lb) 76.198 kg (167 lb 15.8 oz) 77.928 kg (171 lb 12.8 oz)    INITIAL HISTORY: 81 year old Caucasian male with a past medical history of COPD, dementia, chronic systolic congestive heart failure, atrial fibrillation, presented with shortness of breath and altered mental status. Patient was found to have acute COPD exacerbation. He was hospitalized for further management.  Consultations:  None  Procedures:  None  HOSPITAL COURSE:   Acute COPD exacerbation  Patient was admitted and started on nebs, antibiotics and steroids. He started improving. Continue steroids, nebulizer treatments and antibiotics. He is chronically on Oxygen at 2lpm. Chest x-ray showed chronic changes. Clinically, he did not have any evidence for pulmonary edema. He is much improved.  Elevated troponin Was only minimally elevated. Possibly due to demand ischemia.No complaints of CP. No ischemic changes on EKG.Echocardiogram from May showed EF of 45-50%. Does not  need further work up.   Chronic Systolic & Diastolic Heart Failure/NSVT Stable. Lasix was held at the time of admission due to Hypernatremia.It was resumed subsequently. Continue metoprolol, digoxin. Dig level was normal. Most recent echocardiogram from April showed an ejection fraction of 45-50%. Diffuse wall motion abnormalities noted. Nonsustained ventricular tachycardia noted on telemetry 10/10 morning. He was asymptomatic. Potassium is normal. Magnesium is normal. Continue beta blocker. No further recurrence.   Hypernatremia with dehydration/Elevation in creatinine Likely due to poor PO intake.His Lasix was held. He was given gentle IV hydration. Sodium has improved. Unclear if patient has chronic kidney disease.  Acute encephalopathy in the setting of dementia Possibly progressive dementia combined with no rest night prior to hospitalization and acute illness was likely responsible.Patient's mental status is back to baseline.   Atrial Fibrillation / [place]ural thrombus Rate is well controlled.Continue coumadin and metoprolol. He was seen by cardiology recently in September. Plan is for DC cardioversion in the near future. Monitor PT/INR.  Hyperlipidemia Continue Statin.  Dementia, Possibly Alzheimers Stable. At baseline.  Overall improved. [place] for discharge.  PERTINENT LABS:  The results of significant diagnostics from this hospitalization (including imaging, microbiology, ancillary and laboratory) are listed below for reference.    Microbiology: Recent Results (from the past 240 hour(s))  Urine culture     Status: None   Collection Time: 02/23/15  5:04 AM  Result Value Ref Range Status   Specimen Description URINE, RANDOM  Final   Special Requests NONE  Final   Culture MULTIPLE SPECIES PRESENT, SUGGEST RECOLLECTION  Final   Report Status 02/24/2015 FINAL  Final  MRSA PCR Screening     Status:  None   Collection Time: 02/23/15  4:17 PM  Result Value Ref Range Status   MRSA by  PCR NEGATIVE NEGATIVE Final    Comment:        The GeneXpert MRSA Assay (FDA approved for NASAL specimens only), is one component of a comprehensive MRSA colonization surveillance program. It is not intended to diagnose MRSA infection nor to guide or monitor treatment for MRSA infections.      Labs: Basic Metabolic Panel:  Recent Labs Lab 02/22/15 0702 02/22/15 1600 02/23/15 0300 02/24/15 0419  NA 149*  --  144 141  K 4.1  --  4.4 4.4  CL 106  --  104 104  CO2 34*  --  30 31  GLUCOSE 110*  --  153* 158*  BUN 29*  --  32* 39*  CREATININE 1.34* 1.33* 1.11 1.20  CALCIUM 9.1  --  8.6* 8.4*  MG  --   --   --  2.2   Liver Function Tests:  Recent Labs Lab 02/22/15 1037  AST 22  ALT 19  ALKPHOS 49  BILITOT 1.9*  PROT 5.3*  ALBUMIN 3.2*    Recent Labs Lab 02/22/15 1038  AMMONIA 18   CBC:  Recent Labs Lab 02/22/15 0702 02/24/15 0419 02/25/15 0411  WBC 9.3 9.7 11.1*  NEUTROABS 6.7  --   --   HGB 15.9 14.4 14.1  HCT 48.5 44.7 43.8  MCV 102.1* 101.6* 101.6*  PLT 111* 89* 87*   Cardiac Enzymes:  Recent Labs Lab 02/22/15 0702 02/22/15 1600 02/22/15 2111 02/23/15 0300 02/24/15 1433  TROPONINI 0.09* 0.07* 0.06* 0.08* 0.05*   BNP: BNP (last 3 results)  Recent Labs  08/26/14 1810 01/23/15 1943 02/22/15 0702  BNP 656.0* 1033.2* 690.8*   CBG:  Recent Labs Lab 02/23/15 2108 02/24/15 0534 02/24/15 1127 02/24/15 1626 02/24/15 2119  GLUCAP 108* 151* 184* 205* 164*     IMAGING STUDIES X-ray Chest Pa And Lateral  02/23/2015   CLINICAL DATA:  Followup pneumonia. Chronic obstructive lung disease.  EXAM: CHEST  2 VIEW  COMPARISON:  02/22/2015 and previous  FINDINGS: The heart is enlarged. There is atherosclerosis of the aorta. There is a worsened appearance of interstitial markings and patchy lung density bilaterally. The findings could go along with fluid overload/early edema, but patchy bilateral bronchopneumonia is possible. There may be a  small amount of pleural fluid in the posterior costophrenic angles.  IMPRESSION: Background pattern of chronic lung disease with a worsened appearance of interstitial markings bilaterally that could go along with edema or patchy bilateral bronchopneumonia.   Electronically Signed   By: Nelson Chimes M.D.   On: 02/23/2015 09:09   Dg Chest Port 1 View  02/22/2015   CLINICAL DATA:  Dyspnea.  EXAM: PORTABLE CHEST 1 VIEW  COMPARISON:  01/23/2015 chest radiograph  FINDINGS: Stable cardiomediastinal silhouette with mild cardiomegaly. No pneumothorax. No pleural effusion. There is cephalization of the pulmonary vasculature without overt pulmonary edema. Curvilinear opacities at the lung bases likely represent atelectasis.  IMPRESSION: 1. Stable mild cardiomegaly without overt pulmonary edema. 2. Curvilinear bibasilar lung opacities, favor mild atelectasis.   Electronically Signed   By: Ilona Sorrel M.D.   On: 02/22/2015 07:27    DISCHARGE EXAMINATION: Filed Vitals:   02/24/15 1946 02/24/15 2010 02/25/15 0405 02/25/15 0803  BP:  100/49 114/90   Pulse: 81 82 52   Temp:  98.2 F (36.8 C) 98 F (36.7 C)   TempSrc:  Oral Axillary  Resp: 16 18 18    Height:      Weight:   77.928 kg (171 lb 12.8 oz)   SpO2: 96% 98% 97% 95%   General appearance: alert, cooperative and no distress Resp: coarse breath sounds. no wheezing. no crackles. Cardio: s1s2 is irregularly irregular. no s3s4. no pedal edema GI: soft, non-tender; bowel sounds normal; no masses,  no organomegaly Extremities: extremities normal, atraumatic, no cyanosis or edema  DISPOSITION: SNF  Discharge Instructions    Call MD for:  difficulty breathing, headache or visual disturbances    Complete by:  As directed      Call MD for:  extreme fatigue    Complete by:  As directed      Call MD for:  persistant dizziness or light-headedness    Complete by:  As directed      Call MD for:  persistant nausea and vomiting    Complete by:  As directed       Call MD for:  severe uncontrolled pain    Complete by:  As directed      Call MD for:  temperature >100.4    Complete by:  As directed      Discharge instructions    Complete by:  As directed   CBC and Bmet next week. PT/INR per SNF protocol.   You were cared for by a hospitalist during your hospital stay. If you have any questions about your discharge medications or the care you received while you were in the hospital after you are discharged, you can call the unit and asked to speak with the hospitalist on call if the hospitalist that took care of you is not available. Once you are discharged, your primary care physician will handle any further medical issues. Please note that NO REFILLS for any discharge medications will be authorized once you are discharged, as it is imperative that you return to your primary care physician (or establish a relationship with a primary care physician if you do not have one) for your aftercare needs so that they can reassess your need for medications and monitor your lab values. If you do not have a primary care physician, you can call [phone] for a physician referral.     Increase activity slowly    Complete by:  As directed            ALLERGIES:  Allergies  Allergen Reactions  . Codeine Other (See Comments)    Reaction during surgical  Procedure-unknown  . Morphine And Related Other (See Comments)    Reaction unknown-during surgical procedure     Current Discharge Medication List    START taking these medications   Details  albuterol (PROVENTIL) (2.5 MG/3ML) 0.083% nebulizer solution Three times day scheduled and then as needed every 4hrs for shortness of breath Qty: 75 mL, Refills: 12    azithromycin (ZITHROMAX) 250 MG tablet Take 1 tablet (250 mg total) by mouth daily. For 3 more days Qty: 3 each, Refills: 0    guaiFENesin (MUCINEX) 600 MG 12 hr tablet Take 2 tablets (1,200 mg total) by mouth 2 (two) times daily.    predniSONE  (DELTASONE) 20 MG tablet Take 3 tablets once daily for 4 days, then take 2 tablets once daily for 4 days, then take 1 tablet once daily for 4 days, then STOP      CONTINUE these medications which have CHANGED   Details  traZODone (DESYREL) 50 MG tablet Take 0.5 tablets (25 mg total) by  mouth at bedtime as needed for sleep.      CONTINUE these medications which have NOT CHANGED   Details  Asenapine Maleate (SAPHRIS) 10 MG SUBL Place 10 mg under the tongue 2 (two) times daily.    bisacodyl (DULCOLAX) 10 MG suppository Place 10 mg rectally as needed for moderate constipation.    diclofenac sodium (VOLTAREN) 1 % GEL Apply 2 g topically 2 (two) times daily.    digoxin (LANOXIN) 0.25 MG tablet Take 1 tablet (0.25 mg total) by mouth daily. Qty: 30 tablet, Refills: 0    donepezil (ARICEPT) 5 MG tablet Take 5 mg by mouth at bedtime.    doxazosin (CARDURA) 2 MG tablet Take 1 tablet (2 mg total) by mouth daily. Qty: 30 tablet, Refills: 2   Associated Diagnoses: Essential hypertension    ezetimibe-simvastatin (VYTORIN) 10-40 MG per tablet Take 1 tablet by mouth at bedtime. Qty: 30 tablet, Refills: 2   Associated Diagnoses: Coronary artery disease involving native coronary artery of native heart without angina pectoris    furosemide (LASIX) 20 MG tablet Take 20 mg by mouth daily.  Qty: 30 tablet, Refills: 6   Associated Diagnoses: Hyperlipidemia; Chronic systolic CHF (congestive heart failure), NYHA class 2 ([place]); Paroxysmal atrial fibrillation (HCC)    magnesium hydroxide (MILK OF MAGNESIA) 400 MG/5ML suspension Take 30 mLs by mouth daily as needed for mild constipation.    metoprolol succinate (TOPROL-XL) 50 MG 24 hr tablet Take 1 tablet by mouth  daily Qty: 90 tablet, Refills: 4    mirtazapine (REMERON) 30 MG tablet Take 30 mg by mouth at bedtime.    nicotine (NICODERM CQ - DOSED IN MG/24 HOURS) 21 mg/24hr patch Place 21 mg onto the skin daily.    nitroGLYCERIN (NITROSTAT) 0.4 MG SL  tablet Place 1 tablet (0.4 mg total) under the tongue every 5 (five) minutes as needed. Qty: 25 tablet, Refills: 6    ondansetron (ZOFRAN) 4 MG tablet Take 4 mg by mouth every 8 (eight) hours as needed for nausea or vomiting.    PROAIR HFA 108 (90 BASE) MCG/ACT inhaler INHALE 2 PUFFS INTO THE LUNGS EVERY 6 HOURS AS NEEDED FOR WHEEZING OR SHORTNESS OF BREATH. Qty: 8.5 Inhaler, Refills: 3    saccharomyces boulardii (FLORASTOR) 250 MG capsule Take 250 mg by mouth 2 (two) times daily. Start 02/21/15, stop 03/03/15    Sodium Phosphates (RA SALINE ENEMA) 19-7 GM/118ML ENEM Place 1 each rectally as needed (for constipation).    Tiotropium Bromide-Olodaterol (STIOLTO RESPIMAT) 2.5-2.5 MCG/ACT AERS Inhale 2 puffs into the lungs daily. Qty: 4 g, Refills: 5    !! warfarin (COUMADIN) 7.5 MG tablet Take 7.5 mg (one tablet) on Monday, Wednesday, Friday Qty: 15 tablet, Refills: 0    !! warfarin (COUMADIN) 6 MG tablet Take 6 mg (one tablet) daily on Sunday, Tuesday, Thursday, Saturday Qty: 15 tablet, Refills: 0     !! - Potential duplicate medications found. Please discuss with provider.     Follow-up Information    Follow up with [place], MD. Schedule an appointment as soon as possible for a visit on 03/06/2015.   Specialty:  Family Medicine   Why:  post hospitalization follow up @ 10:15am confirmed w/ Felicita Gage information:   [address] [place] [address] [phone]       Follow up with Peter Martinique, MD. Schedule an appointment as soon as possible for a visit in 2 weeks.   Specialty:  Cardiology  Why:  afib management. possible cardioversion   Contact information:   [address] [place] [address] [phone]       TOTAL DISCHARGE TIME: 35 mins  [facility] Pager [phone]  02/25/2015, 10:46 AM

## 2015-02-26 ENCOUNTER — Non-Acute Institutional Stay (SKILLED_NURSING_FACILITY): Payer: Medicare Other | Admitting: Nurse Practitioner

## 2015-02-26 DIAGNOSIS — I5042 Chronic combined systolic (congestive) and diastolic (congestive) heart failure: Secondary | ICD-10-CM

## 2015-02-26 DIAGNOSIS — F0390 Unspecified dementia without behavioral disturbance: Secondary | ICD-10-CM | POA: Diagnosis not present

## 2015-02-26 DIAGNOSIS — I4819 Other persistent atrial fibrillation: Secondary | ICD-10-CM

## 2015-02-26 DIAGNOSIS — E870 Hyperosmolality and hypernatremia: Secondary | ICD-10-CM | POA: Diagnosis not present

## 2015-02-26 DIAGNOSIS — I481 Persistent atrial fibrillation: Secondary | ICD-10-CM | POA: Diagnosis not present

## 2015-02-26 DIAGNOSIS — I110 Hypertensive heart disease with heart failure: Secondary | ICD-10-CM | POA: Diagnosis not present

## 2015-02-26 DIAGNOSIS — J441 Chronic obstructive pulmonary disease with (acute) exacerbation: Secondary | ICD-10-CM | POA: Diagnosis not present

## 2015-02-26 LAB — POCT INR: INR: 2.5 — AB (ref <=1.1)

## 2015-02-26 NOTE — Progress Notes (Signed)
Patient ID: Jay Guzman, male   DOB: May 30, 1932, 81 y.o.   MRN: [id number]      Nursing Home Location:  [facility]  Full Code  Place of Service: SNF (31)  PCP: Odette Fraction, MD   Allergies  Allergen Reactions  . Codeine Other (See Comments)    Reaction during surgical  Procedure-unknown  . Morphine And Related Other (See Comments)    Reaction unknown-during surgical procedure    Chief Complaint  Patient presents with  . Hospitalization Follow-up    Hospital Follow up    HPI:  Patient is a 81 y.o. male seen today at [facility] for hospital follow up. Pt with a hx of dementia, COPD, CHF, afib, CAD, HTN. Pt was found to have COPD exacerbation and hospitalized. Treated with nebs, antibiotics and steroids. He is now at [facility] for ongoing treatment of COPD exacerbation and for strength training. Nursing without acute issues at this time. Son very involved in pts care. Pt a poor historian due to dementia. However denies shortness of breath, cough congestion. Denies trouble with urination and moving bowels without difficulty. Denies pain.   Review of Systems:  Review of Systems  Unable to perform ROS: Dementia  All other systems reviewed and are negative.   Past Medical History  Diagnosis Date  . Coronary disease     WITH REMOTE ANTERIOR MYOCARDIAL INFARCTION  . Mural thrombus of heart (HCC)     CHRONIC  . Hypertension   . Hyperlipidemia   . CHF (congestive heart failure) ([place])   . Dementia   . A-fib ([place])   . COPD (chronic obstructive pulmonary disease) ([place])   . Shortness of breath dyspnea    Past Surgical History  Procedure Laterality Date  . Cardiac catheterization  2000  . Hernia repair      STATUS POST BILATERAL  . Abdominal aortic aneurysm repair  2008   Social History:   reports that he has quit smoking. His smoking use included Cigarettes. He has a 60 pack-year smoking history. He does not have any  smokeless tobacco history on file. He reports that he does not drink alcohol or use illicit drugs.  Family History  Problem Relation Age of Onset  . Cerebral aneurysm Father   . Cancer Sister   . Cancer Mother     Medications: Patient's Medications  New Prescriptions   No medications on file  Previous Medications   ALBUTEROL (PROVENTIL) (2.5 MG/3ML) 0.083% NEBULIZER SOLUTION    Three times day scheduled and then as needed every 4hrs for shortness of breath   ASENAPINE MALEATE (SAPHRIS) 10 MG SUBL    Place 10 mg under the tongue 2 (two) times daily.   AZITHROMYCIN (ZITHROMAX) 250 MG TABLET    Take 1 tablet (250 mg total) by mouth daily. For 3 more days   BISACODYL (DULCOLAX) 10 MG SUPPOSITORY    Place 10 mg rectally as needed for moderate constipation.   DICLOFENAC SODIUM (VOLTAREN) 1 % GEL    Apply 2 g topically 2 (two) times daily.   DIGOXIN (LANOXIN) 0.25 MG TABLET    Take 1 tablet (0.25 mg total) by mouth daily.   DONEPEZIL (ARICEPT) 5 MG TABLET    Take one tablet by mouth once daily at bedtime for dementia   DOXAZOSIN (CARDURA) 2 MG TABLET    Take 1 tablet (2 mg total) by mouth daily.   EZETIMIBE-SIMVASTATIN (VYTORIN) 10-40 MG PER TABLET  Take 1 tablet by mouth at bedtime.   FUROSEMIDE (LASIX) 20 MG TABLET    Take one tablet by mouth once daily for CHF   GUAIFENESIN (MUCINEX) 600 MG 12 HR TABLET    Take 2 tablets (1,200 mg total) by mouth 2 (two) times daily.   MAGNESIUM HYDROXIDE (MILK OF MAGNESIA) 400 MG/5ML SUSPENSION    Take 30 mLs by mouth daily as needed for mild constipation.   METOPROLOL SUCCINATE (TOPROL-XL) 50 MG 24 HR TABLET    Take 1 tablet by mouth  daily   MIRTAZAPINE (REMERON) 30 MG TABLET    Take one tablet by mouth once daily at bedtime for sleep   NICOTINE (NICODERM CQ - DOSED IN MG/24 HOURS) 21 MG/24HR PATCH    Place 21 mg onto the skin daily.   NITROGLYCERIN (NITROSTAT) 0.4 MG SL TABLET    Place 1 tablet (0.4 mg total) under the tongue every 5 (five) minutes as  needed.   ONDANSETRON (ZOFRAN) 4 MG TABLET    Take 4 mg by mouth every 8 (eight) hours as needed for nausea or vomiting.   PREDNISONE (DELTASONE) 20 MG TABLET    Take 3 tablets once daily for 4 days, then take 2 tablets once daily for 4 days, then take 1 tablet once daily for 4 days, then STOP   SACCHAROMYCES BOULARDII (FLORASTOR) 250 MG CAPSULE    Take 250 mg by mouth 2 (two) times daily. Start 02/21/15, stop 03/03/15   SODIUM PHOSPHATES (RA SALINE ENEMA) 19-7 GM/118ML ENEM    Place 1 each rectally as needed (for constipation).   TIOTROPIUM BROMIDE-OLODATEROL (STIOLTO RESPIMAT) 2.5-2.5 MCG/ACT AERS    Inhale 2 puffs into the lungs daily.   TRAZODONE (DESYREL) 50 MG TABLET    Take 0.5 tablets (25 mg total) by mouth at bedtime as needed for sleep.   WARFARIN (COUMADIN) 6 MG TABLET    Take 6 mg (one tablet) daily on Sunday, Tuesday, Thursday, Saturday   WARFARIN (COUMADIN) 7.5 MG TABLET    Take 7.5 mg (one tablet) on Monday, Wednesday, Friday  Modified Medications   No medications on file  Discontinued Medications   PROAIR HFA 108 (90 BASE) MCG/ACT INHALER    INHALE 2 PUFFS INTO THE LUNGS EVERY 6 HOURS AS NEEDED FOR WHEEZING OR SHORTNESS OF BREATH.     Physical Exam: Filed Vitals:   02/26/15 1007  BP: 114/72  Pulse: 69  Temp: 97.7 F (36.5 C)  TempSrc: Oral  Resp: 18  Height: 5\' 7"  (1.702 m)  Weight: 169 lb (76.658 kg)  SpO2: 93%    Physical Exam  Constitutional: He is oriented to person, place, and time. No distress.  Frail elderly male  HENT:  Head: Normocephalic and atraumatic.  Mouth/Throat: Oropharynx is clear and moist. No oropharyngeal exudate.  Eyes: Conjunctivae and EOM are normal. Pupils are equal, round, and reactive to light.  Neck: Normal range of motion. Neck supple.  Cardiovascular: Normal rate, regular rhythm and normal heart sounds.   Pulmonary/Chest: Effort normal. He has no wheezes.  diminished in the bases  Abdominal: Soft. Bowel sounds are normal.    Musculoskeletal: He exhibits no edema or tenderness.  Neurological: He is alert and oriented to person, place, and time.  Skin: Skin is warm and dry. He is not diaphoretic.  Psychiatric: He has a normal mood and affect.  Memory loss    Labs reviewed: Basic Metabolic Panel:  Recent Labs  01/26/15 0547 01/29/15 0308  02/22/15 0702 02/22/15 1600  02/23/15 0300 02/24/15 0419  NA 144 139  < > 149*  --  144 141  K 4.5 4.3  < > 4.1  --  4.4 4.4  CL 103 93*  < > 106  --  104 104  CO2 35* 39*  < > 34*  --  30 31  GLUCOSE 112* 105*  < > 110*  --  153* 158*  BUN 28* 38*  < > 29*  --  32* 39*  CREATININE 1.24 1.38*  < > 1.34* 1.33* 1.11 1.20  CALCIUM 8.4* 8.6*  < > 9.1  --  8.6* 8.4*  MG 2.3 2.3  --   --   --   --  2.2  < > = values in this interval not displayed. Liver Function Tests:  Recent Labs  12/25/14 1405 01/10/15 1014 02/22/15 1037  AST 21 33 22  ALT 17 49* 19  ALKPHOS 49 47 49  BILITOT 1.7* 1.2 1.9*  PROT 5.7* 4.9* 5.3*  ALBUMIN 3.5 3.3* 3.2*   No results for input(s): LIPASE, AMYLASE in the last 8760 hours.  Recent Labs  02/22/15 1038  AMMONIA 18   CBC:  Recent Labs  12/25/14 1405 01/10/15 1014  02/22/15 0702 02/24/15 0419 02/25/15 0411  WBC 8.7 7.6  < > 9.3 9.7 11.1*  NEUTROABS 6.7 5.7  --  6.7  --   --   HGB 17.1* 16.1  < > 15.9 14.4 14.1  HCT 51.2 49.5  < > 48.5 44.7 43.8  MCV 101.2* 100.6*  < > 102.1* 101.6* 101.6*  PLT 119* 121*  < > 111* 89* 87*  < > = values in this interval not displayed. TSH: No results for input(s): TSH in the last 8760 hours. A1C: No results found for: HGBA1C Lipid Panel: No results for input(s): CHOL, HDL, LDLCALC, TRIG, CHOLHDL, LDLDIRECT in the last 8760 hours.  Radiological Exams: X-ray Chest Pa And Lateral  02/23/2015  CLINICAL DATA:  Followup pneumonia. Chronic obstructive lung disease. EXAM: CHEST  2 VIEW COMPARISON:  02/22/2015 and previous FINDINGS: The heart is enlarged. There is atherosclerosis of the  aorta. There is a worsened appearance of interstitial markings and patchy lung density bilaterally. The findings could go along with fluid overload/early edema, but patchy bilateral bronchopneumonia is possible. There may be a small amount of pleural fluid in the posterior costophrenic angles. IMPRESSION: Background pattern of chronic lung disease with a worsened appearance of interstitial markings bilaterally that could go along with edema or patchy bilateral bronchopneumonia. Electronically Signed   By: Nelson Chimes M.D.   On: 02/23/2015 09:09   Dg Chest Port 1 View  02/22/2015  CLINICAL DATA:  Dyspnea. EXAM: PORTABLE CHEST 1 VIEW COMPARISON:  01/23/2015 chest radiograph FINDINGS: Stable cardiomediastinal silhouette with mild cardiomegaly. No pneumothorax. No pleural effusion. There is cephalization of the pulmonary vasculature without overt pulmonary edema. Curvilinear opacities at the lung bases likely represent atelectasis. IMPRESSION: 1. Stable mild cardiomegaly without overt pulmonary edema. 2. Curvilinear bibasilar lung opacities, favor mild atelectasis. Electronically Signed   By: Ilona Sorrel M.D.   On: 02/22/2015 07:27    Assessment/Plan 1. COPD exacerbation ([place]) Improved after treatment of nebs, antibiotics and steroids in hospital. conts on Zithromax for 2 more days with prednisone taper  2. Persistent atrial fibrillation (HCC) Rate controlled, maintained on coumadin dig, and metoprolol. INR 2.5 today, will cont coumadin 7.4 mg M, W,F and coumadin 6 mg all other days  3. Chronic combined systolic and diastolic  heart failure, NYHA class 2 (HCC) -stable, euvolemic at this time. Continue metoprolol, digoxin. Most recent echocardiogram from April showed an ejection fraction of 45-50%.  4. Hypertensive heart disease with CHF ([place]) Remains stable, cont current regimen and will monitor  5. Acute hypernatremia -lasix held during hospitalization, thought to be due to poor PO intake,  improved on discharge will follow up BMP  6. Dementia, without behavioral disturbance -at baseline currently. Sons provides most of the information to therapy and staff. Not in room during visit but pt does live with son.    Carlos American. Harle Battiest  [facility] [phone] 8 am - 5 pm) [phone] (after hours)

## 2015-02-27 ENCOUNTER — Non-Acute Institutional Stay (SKILLED_NURSING_FACILITY): Payer: Medicare Other | Admitting: Internal Medicine

## 2015-02-27 ENCOUNTER — Encounter: Payer: Self-pay | Admitting: Internal Medicine

## 2015-02-27 DIAGNOSIS — R5381 Other malaise: Secondary | ICD-10-CM | POA: Diagnosis not present

## 2015-02-27 DIAGNOSIS — I2510 Atherosclerotic heart disease of native coronary artery without angina pectoris: Secondary | ICD-10-CM | POA: Diagnosis not present

## 2015-02-27 DIAGNOSIS — J441 Chronic obstructive pulmonary disease with (acute) exacerbation: Secondary | ICD-10-CM

## 2015-02-27 DIAGNOSIS — R400 Somnolence: Secondary | ICD-10-CM

## 2015-02-27 DIAGNOSIS — E870 Hyperosmolality and hypernatremia: Secondary | ICD-10-CM

## 2015-02-27 DIAGNOSIS — F3170 Bipolar disorder, currently in remission, most recent episode unspecified: Secondary | ICD-10-CM | POA: Diagnosis not present

## 2015-02-27 DIAGNOSIS — I10 Essential (primary) hypertension: Secondary | ICD-10-CM | POA: Diagnosis not present

## 2015-02-27 DIAGNOSIS — I481 Persistent atrial fibrillation: Secondary | ICD-10-CM | POA: Diagnosis not present

## 2015-02-27 DIAGNOSIS — F0390 Unspecified dementia without behavioral disturbance: Secondary | ICD-10-CM

## 2015-02-27 DIAGNOSIS — D72829 Elevated white blood cell count, unspecified: Secondary | ICD-10-CM

## 2015-02-27 DIAGNOSIS — M159 Polyosteoarthritis, unspecified: Secondary | ICD-10-CM

## 2015-02-27 DIAGNOSIS — I4819 Other persistent atrial fibrillation: Secondary | ICD-10-CM

## 2015-02-27 NOTE — Progress Notes (Signed)
Patient ID: Jay Guzman, male   DOB: Nov 01, 1932, 81 y.o.   MRN: [id number]     [facility]  PCP: Odette Fraction, MD  Code Status: Full Code   Allergies  Allergen Reactions  . Codeine Other (See Comments)    Reaction during surgical  Procedure-unknown  . Morphine And Related Other (See Comments)    Reaction unknown-during surgical procedure    Chief Complaint  Patient presents with  . New Admit To SNF    New Admission      HPI:  81 y.o. patient is here for short term rehabilitation post hospital admission from 02/22/15-02/25/15 with copd exacerbation. He received antibiotics, steroid treatment and nebulizer treatment. He was also noted to have troponin leak thought to be from demand ischemia and hypernatremia. He is on chronic oxygen. He is seen in his room today. He is falling asleep in between conversation and is unable to fully participate in hpi and ros. As per staff, was more interactive yesterday. This am had his breakfast by himself without any assistance and since then has been sleeping per staff. He has PMH of dementia, cipd, chf, afib among others.   Review of Systems:  Unable to obtain  Past Medical History  Diagnosis Date  . Coronary disease     WITH REMOTE ANTERIOR MYOCARDIAL INFARCTION  . Mural thrombus of heart (HCC)     CHRONIC  . Hypertension   . Hyperlipidemia   . CHF (congestive heart failure) ([place])   . Dementia   . A-fib ([place])   . COPD (chronic obstructive pulmonary disease) ([place])   . Shortness of breath dyspnea    Past Surgical History  Procedure Laterality Date  . Cardiac catheterization  2000  . Hernia repair      STATUS POST BILATERAL  . Abdominal aortic aneurysm repair  2008   Social History:   reports that he has quit smoking. His smoking use included Cigarettes. He has a 60 pack-year smoking history. He does not have any smokeless tobacco history on file. He reports that he does not drink alcohol or use illicit  drugs.  Family History  Problem Relation Age of Onset  . Cerebral aneurysm Father   . Cancer Sister   . Cancer Mother     Medications:   Medication List       This list is accurate as of: 02/27/15 10:59 AM.  Always use your most recent med list.               albuterol (2.5 MG/3ML) 0.083% nebulizer solution  Commonly known as:  PROVENTIL  Three times day scheduled and then as needed every 4hrs for shortness of breath     azithromycin 250 MG tablet  Commonly known as:  ZITHROMAX  Take 1 tablet (250 mg total) by mouth daily. For 3 more days     bisacodyl 10 MG suppository  Commonly known as:  DULCOLAX  Place 10 mg rectally as needed for moderate constipation.     CARDURA 2 MG tablet  Generic drug:  doxazosin  1 by mouth once daily for hypertension     diclofenac sodium 1 % Gel  Commonly known as:  VOLTAREN  Apply 2 g topically 2 (two) times daily.     digoxin 0.25 MG tablet  Commonly known as:  LANOXIN  Take 1 tablet (0.25 mg total) by mouth daily.     donepezil 5 MG tablet  Commonly known as:  ARICEPT  Take  one tablet by mouth once daily at bedtime for dementia     ezetimibe-simvastatin 10-40 MG tablet  Commonly known as:  VYTORIN  Take one tablet by mouth once daily at bedtime for cholesterol     ezetimibe-simvastatin 10-40 MG tablet  Commonly known as:  VYTORIN  1 by mouth daily at bedtime for cholesterol     furosemide 20 MG tablet  Commonly known as:  LASIX  Take one tablet by mouth once daily for CHF     guaiFENesin 600 MG 12 hr tablet  Commonly known as:  MUCINEX  Take 2 tablets (1,200 mg total) by mouth 2 (two) times daily.     magnesium hydroxide 400 MG/5ML suspension  Commonly known as:  MILK OF MAGNESIA  Take 30 mLs by mouth daily as needed for mild constipation.     metoprolol succinate 50 MG 24 hr tablet  Commonly known as:  TOPROL-XL  Take 1 tablet by mouth  daily for hypertension     mirtazapine 30 MG tablet  Commonly known as:   REMERON  Take one tablet by mouth once daily at bedtime for sleep     nicotine 21 mg/24hr patch  Commonly known as:  NICODERM CQ - dosed in mg/24 hours  Place 21 mg onto the skin daily.     nitroGLYCERIN 0.4 MG SL tablet  Commonly known as:  NITROSTAT  Place 1 tablet (0.4 mg total) under the tongue every 5 (five) minutes as needed.     ondansetron 4 MG tablet  Commonly known as:  ZOFRAN  Take 4 mg by mouth every 8 (eight) hours as needed for nausea or vomiting.     predniSONE 20 MG tablet  Commonly known as:  DELTASONE  Take 3 tablets once daily for 4 days, then take 2 tablets once daily for 4 days, then take 1 tablet once daily for 4 days, then STOP     RA SALINE ENEMA 19-7 GM/118ML Enem  Place 1 each rectally as needed (for constipation).     saccharomyces boulardii 250 MG capsule  Commonly known as:  FLORASTOR  Take 250 mg by mouth 2 (two) times daily. Start 02/21/15, stop 03/03/15     SAPHRIS 10 MG Subl  Generic drug:  Asenapine Maleate  Place 10 mg under the tongue 2 (two) times daily.     Tiotropium Bromide-Olodaterol 2.5-2.5 MCG/ACT Aers  Commonly known as:  STIOLTO RESPIMAT  Inhale 2 puffs into the lungs daily.     traZODone 50 MG tablet  Commonly known as:  DESYREL  Take 0.5 tablets (25 mg total) by mouth at bedtime as needed for sleep.     warfarin 6 MG tablet  Commonly known as:  COUMADIN  Take 6 mg (one tablet) daily on Sunday, Tuesday, Thursday, Saturday     warfarin 7.5 MG tablet  Commonly known as:  COUMADIN  Take 7.5 mg (one tablet) on Monday, Wednesday, Friday         Physical Exam: Filed Vitals:   02/27/15 1048  BP: 126/71  Pulse: 66  Temp: 98.5 F (36.9 C)  TempSrc: Oral  Resp: 18  SpO2: 94%    General- elderly male, well built, in no acute distress Head- normocephalic, atraumatic Nose- normal nasal mucosa, no maxillary or frontal sinus tenderness, no nasal discharge Throat- moist mucus membrane Eyes- PERRLA, EOMI, no pallor, no  icterus, no discharge, normal conjunctiva, normal sclera Neck- no cervical lymphadenopathy, no jugular vein distension Cardiovascular- irrergular heart rate, 1+ leg  edema Respiratory- bilateral poor air entry, no wheeze, no rhonchi, no crackles, no use of accessory muscles, on o2 Abdomen- bowel sounds present, soft, non tender Musculoskeletal- able to move all 4 extremities, generalized weakness Neurological- somnolent, unable to assess Skin- warm and dry Psychiatry- unable to assess   Labs reviewed: Basic Metabolic Panel:  Recent Labs  01/26/15 0547 01/29/15 0308  02/22/15 0702 02/22/15 1600 02/23/15 0300 02/24/15 0419  NA 144 139  < > 149*  --  144 141  K 4.5 4.3  < > 4.1  --  4.4 4.4  CL 103 93*  < > 106  --  104 104  CO2 35* 39*  < > 34*  --  30 31  GLUCOSE 112* 105*  < > 110*  --  153* 158*  BUN 28* 38*  < > 29*  --  32* 39*  CREATININE 1.24 1.38*  < > 1.34* 1.33* 1.11 1.20  CALCIUM 8.4* 8.6*  < > 9.1  --  8.6* 8.4*  MG 2.3 2.3  --   --   --   --  2.2  < > = values in this interval not displayed. Liver Function Tests:  Recent Labs  12/25/14 1405 01/10/15 1014 02/22/15 1037  AST 21 33 22  ALT 17 49* 19  ALKPHOS 49 47 49  BILITOT 1.7* 1.2 1.9*  PROT 5.7* 4.9* 5.3*  ALBUMIN 3.5 3.3* 3.2*   No results for input(s): LIPASE, AMYLASE in the last 8760 hours.  Recent Labs  02/22/15 1038  AMMONIA 18   CBC:  Recent Labs  12/25/14 1405 01/10/15 1014  02/22/15 0702 02/24/15 0419 02/25/15 0411  WBC 8.7 7.6  < > 9.3 9.7 11.1*  NEUTROABS 6.7 5.7  --  6.7  --   --   HGB 17.1* 16.1  < > 15.9 14.4 14.1  HCT 51.2 49.5  < > 48.5 44.7 43.8  MCV 101.2* 100.6*  < > 102.1* 101.6* 101.6*  PLT 119* 121*  < > 111* 89* 87*  < > = values in this interval not displayed. Cardiac Enzymes:  Recent Labs  02/22/15 2111 02/23/15 0300 02/24/15 1433  TROPONINI 0.06* 0.08* 0.05*   BNP: Invalid input(s): POCBNP CBG:  Recent Labs  02/24/15 1626 02/24/15 2119  02/25/15 1145  GLUCAP 205* 164* 140*    Radiological Exams: X-ray Chest Pa And Lateral  02/23/2015  CLINICAL DATA:  Followup pneumonia. Chronic obstructive lung disease. EXAM: CHEST  2 VIEW COMPARISON:  02/22/2015 and previous FINDINGS: The heart is enlarged. There is atherosclerosis of the aorta. There is a worsened appearance of interstitial markings and patchy lung density bilaterally. The findings could go along with fluid overload/early edema, but patchy bilateral bronchopneumonia is possible. There may be a small amount of pleural fluid in the posterior costophrenic angles. IMPRESSION: Background pattern of chronic lung disease with a worsened appearance of interstitial markings bilaterally that could go along with edema or patchy bilateral bronchopneumonia. Electronically Signed   By: Nelson Chimes M.D.   On: 02/23/2015 09:09   Dg Chest Port 1 View  02/22/2015  CLINICAL DATA:  Dyspnea. EXAM: PORTABLE CHEST 1 VIEW COMPARISON:  01/23/2015 chest radiograph FINDINGS: Stable cardiomediastinal silhouette with mild cardiomegaly. No pneumothorax. No pleural effusion. There is cephalization of the pulmonary vasculature without overt pulmonary edema. Curvilinear opacities at the lung bases likely represent atelectasis. IMPRESSION: 1. Stable mild cardiomegaly without overt pulmonary edema. 2. Curvilinear bibasilar lung opacities, favor mild atelectasis. Electronically Signed   By:  Ilona Sorrel M.D.   On: 02/22/2015 07:27     Assessment/Plan  Physical deconditioning Will have patient work with PT/OT as tolerated to regain strength and restore function.  Fall precautions are in place.  Copd exacerbation Continue and complete course of azithromycin on 03/01/15. Continue tiotropium olodaterol daily with proair and albuterol neb q4h prn. Continue mucinex bid. Continue and complete tapering course of prednisone. Continue o2 for now.  Leukocytosis Recent copd exacerbation and being on prednsione could be  contributing to this. Monitor cbc with diff  afib Rate currently controlled. Continue digoxin 0.25 mg daily and toprol xl 50 mg daily with coumadin for anticoagulation with goal inr 2-3.Will need to follow up with cardiology dr [place] for possible cardioversion assessment. inr 02/26/15 2.5. Next inr check 02/28/15  Somnolence D/c trazodone for now. Monitor o2 sat. Avoid sedative/ hypnotics. Check cbc with diff and cmp with digoxin level  OA Continue diclofenac gel  Dementia Continue aricept 5 mg daily  HTN Stable bp, continue cardura 2 mg daily, lasix 20 mg daily, toprol 50 mg daily and monitor bp  Bipolar disorder Continue remeron 30 mg daily and saphris 10 mg sublingual bid  CAD Continue prn NTG with b blocker and statin for now  Hypernatremia Appears resolved in labs, check bmp   Goals of care: short term rehabilitation   Labs/tests ordered: cbc with diff, cmp, digoxin level 02/28/15  Family/ staff Communication: reviewed care plan with patient and nursing supervisor    Blanchie Serve, MD  [facility] [phone] (Monday-Friday 8 am - 5 pm) [phone] (afterhours)

## 2015-03-01 ENCOUNTER — Encounter: Payer: Self-pay | Admitting: Internal Medicine

## 2015-03-01 DIAGNOSIS — L03115 Cellulitis of right lower limb: Secondary | ICD-10-CM | POA: Insufficient documentation

## 2015-03-01 NOTE — Assessment & Plan Note (Signed)
Early - start doxycycline 100 mg BID for 7 days

## 2015-03-04 LAB — CBC AND DIFFERENTIAL
HCT: 44 % (ref 41–53)
HEMOGLOBIN: 14.1 g/dL (ref 13.5–17.5)
Platelets: 106 10*3/uL — AB (ref 150–399)
WBC: 8 10*3/mL

## 2015-03-04 LAB — BASIC METABOLIC PANEL
BUN: 29 mg/dL — AB (ref 4–21)
Creatinine: 0.9 mg/dL (ref 0.6–1.3)
GLUCOSE: 71 mg/dL
POTASSIUM: 4 mmol/L (ref 3.4–5.3)
SODIUM: 142 mmol/L (ref 137–147)

## 2015-03-06 ENCOUNTER — Inpatient Hospital Stay: Payer: Medicare Other | Admitting: Family Medicine

## 2015-03-11 ENCOUNTER — Encounter: Payer: Self-pay | Admitting: Cardiology

## 2015-03-11 ENCOUNTER — Ambulatory Visit (INDEPENDENT_AMBULATORY_CARE_PROVIDER_SITE_OTHER): Payer: Medicare Other | Admitting: Cardiology

## 2015-03-11 VITALS — BP 100/60 | HR 79 | Ht 67.0 in | Wt 171.0 lb

## 2015-03-11 DIAGNOSIS — I5043 Acute on chronic combined systolic (congestive) and diastolic (congestive) heart failure: Secondary | ICD-10-CM

## 2015-03-11 DIAGNOSIS — I5022 Chronic systolic (congestive) heart failure: Secondary | ICD-10-CM | POA: Diagnosis not present

## 2015-03-11 DIAGNOSIS — I481 Persistent atrial fibrillation: Secondary | ICD-10-CM | POA: Diagnosis not present

## 2015-03-11 DIAGNOSIS — I480 Paroxysmal atrial fibrillation: Secondary | ICD-10-CM

## 2015-03-11 DIAGNOSIS — E785 Hyperlipidemia, unspecified: Secondary | ICD-10-CM | POA: Diagnosis not present

## 2015-03-11 DIAGNOSIS — I4819 Other persistent atrial fibrillation: Secondary | ICD-10-CM

## 2015-03-11 LAB — BASIC METABOLIC PANEL
BUN: 25 mg/dL (ref 7–25)
CO2: 30 mmol/L (ref 20–31)
Calcium: 8.5 mg/dL — ABNORMAL LOW (ref 8.6–10.3)
Chloride: 103 mmol/L (ref 98–110)
Creat: 1.14 mg/dL — ABNORMAL HIGH (ref 0.70–1.11)
Glucose, Bld: 94 mg/dL (ref 65–99)
POTASSIUM: 4 mmol/L (ref 3.5–5.3)
SODIUM: 141 mmol/L (ref 135–146)

## 2015-03-11 MED ORDER — FUROSEMIDE 40 MG PO TABS: 40.0000 mg | ORAL_TABLET | Freq: Every day | ORAL | Status: DC

## 2015-03-11 NOTE — Progress Notes (Signed)
03/11/2015 Jay Guzman   05/06/1933  [id number]  Primary Physician Jenna Luo TOM, MD Primary Cardiologist: Dr. Martinique  Reason for Visit/CC: F/U for Atrial Fibrillation  HPI:  The patient is a 81 y/o male with a past medical history significant for dementia, COPD, hypertension, dyslipidemia and coronary artery disease with remote anterior MI. He had a catheterization in 2000 which showed an occluded LAD. He also has a history of apical thrombus and is on chronic or coumadin therapy for this.  He is  known to have chronic systolic congestive heart failure and moderate LV dysfunction with an EF of 45-50% with akinesis of the mid apical anteroseptal, anterior, inferior and apical myocardium on echo in April 2016. Chronic LV mural thrombus was noted and there is moderate pulmonary hypertension with an RVSP of 53 mmHg.  He also has a history of atrial fibrillation which was recently diagnosed in September of this year.  Initial plans were to attempt direct current cardioversion after 4 weeks of therapeutic INRs with Coumadin.  However since that time, he has required hospitalization.  He was recently admitted to [facility] by internal medicine 02/22/15 -02/25/2015.  He presented with shortness of breath and was found to have an acute COPD exacerbation. He was in atrial fibrillation but HR was well controlled. He was also noted to have minimally elevated troponin levels however this was felt to be due to demand ischemia. He denied any chest pain. There were no ischemic changes on his EKG. He did not undergo any ischemic workup. He was instructed to f/u in office for further management of his afib.   His EKG today shows atrial fibrillation with a CVR in the low 60s. BP is 100/60. His main complaint is weight gain and increased LE since leaving the hospital. He is currently on low dose lasix, 20 mg daily. Review of labs 02/24/15 shows Scr was 1.20. K was 4.4. He denies dyspnea.    Current Outpatient  Prescriptions  Medication Sig Dispense Refill  . albuterol (PROVENTIL) (2.5 MG/3ML) 0.083% nebulizer solution Three times day scheduled and then as needed every 4hrs for shortness of breath 75 mL 12  . Asenapine Maleate (SAPHRIS) 10 MG SUBL Place 10 mg under the tongue 2 (two) times daily.    Marland Kitchen azithromycin (ZITHROMAX) 250 MG tablet Take 1 tablet (250 mg total) by mouth daily. For 3 more days 3 each 0  . bisacodyl (DULCOLAX) 10 MG suppository Place 10 mg rectally as needed for moderate constipation.    . diclofenac sodium (VOLTAREN) 1 % GEL Apply 2 g topically 2 (two) times daily.    . digoxin (LANOXIN) 0.25 MG tablet Take 1 tablet (0.25 mg total) by mouth daily. 30 tablet 0  . donepezil (ARICEPT) 5 MG tablet Take one tablet by mouth once daily at bedtime for dementia    . doxazosin (CARDURA) 2 MG tablet 1 by mouth once daily for hypertension    . ezetimibe-simvastatin (VYTORIN) 10-40 MG tablet Take one tablet by mouth once daily at bedtime for cholesterol    . furosemide (LASIX) 40 MG tablet Take 1 tablet (40 mg total) by mouth daily. Take one tablet by mouth once daily for CHF 30 tablet 6  . guaiFENesin (MUCINEX) 600 MG 12 hr tablet Take 2 tablets (1,200 mg total) by mouth 2 (two) times daily.    . magnesium hydroxide (MILK OF MAGNESIA) 400 MG/5ML suspension Take 30 mLs by mouth daily as needed for mild constipation.    . metoprolol  succinate (TOPROL-XL) 50 MG 24 hr tablet Take 1 tablet by mouth  daily for hypertension    . mirtazapine (REMERON) 30 MG tablet Take one tablet by mouth once daily at bedtime for sleep    . nicotine (NICODERM CQ - DOSED IN MG/24 HOURS) 21 mg/24hr patch Place 21 mg onto the skin daily.    . nitroGLYCERIN (NITROSTAT) 0.4 MG SL tablet Place 0.4 mg under the tongue every 5 (five) minutes as needed for chest pain (3X WEEKLY).    Marland Kitchen ondansetron (ZOFRAN) 4 MG tablet Take 4 mg by mouth every 8 (eight) hours as needed for nausea or vomiting.    . Sodium Phosphates (RA SALINE  ENEMA) 19-7 GM/118ML ENEM Place 1 each rectally as needed (for constipation).    . STIOLTO RESPIMAT 2.5-2.5 MCG/ACT AERS INHALE 2 PUFFS INTO THE LUNGS DAILY.  5  . traZODone (DESYREL) 50 MG tablet Take 0.5 tablets (25 mg total) by mouth at bedtime as needed for sleep.    Marland Kitchen tuberculin 5 UNIT/0.1ML injection Inject 0.1 mLs into the skin once.    . warfarin (COUMADIN) 6 MG tablet Take 6 mg (one tablet) daily on Sunday, Tuesday, Thursday, Saturday 15 tablet 0  . warfarin (COUMADIN) 7.5 MG tablet Take 7.5 mg (one tablet) on Monday, Wednesday, Friday (Patient taking differently: Take 7.5 mg by mouth daily at 6 PM. ) 15 tablet 0   No current facility-administered medications for this visit.    Allergies  Allergen Reactions  . Codeine Other (See Comments)    Reaction during surgical  Procedure-unknown  . Morphine And Related Other (See Comments)    Reaction unknown-during surgical procedure    Social History   Social History  . Marital Status: Married    Spouse Name: N/A  . Number of Children: 2  . 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 Smoking status: Former Smoker -- 1.00 packs/day for 60 years    Types: Cigarettes  . Smokeless tobacco: Not on file  . Alcohol Use: No  . Drug Use: No  . Sexual Activity: No   Other Topics Concern  . Not on file   Social History Narrative     Review of Systems: General: negative for chills, fever, night sweats or weight changes.  Cardiovascular: negative for chest pain, dyspnea on exertion, edema, orthopnea, palpitations, paroxysmal nocturnal dyspnea or shortness of breath Dermatological: negative for rash Respiratory: negative for cough or wheezing Urologic: negative for hematuria Abdominal: negative for nausea, vomiting, diarrhea, bright red blood per rectum, melena, or hematemesis Neurologic: negative for visual changes, syncope, or dizziness All other systems reviewed and are  otherwise negative except as noted above.    Blood pressure 100/60, pulse 79, height 5\' 7"  (1.702 m), weight 171 lb (77.565 kg), SpO2 97 %.  General appearance: alert, cooperative and no distress Neck: no carotid bruit and no JVD Lungs: diffuse expiratory wheezing bilaterally Heart: irregularly irregular rhythm and regular rate Extremities: 2+ bilateral LEE Pulses: 2+ and symmetric Skin: warm and dry Neurologic: Grossly normal  EKG atrial fibrillation. 62 bpm   ASSESSMENT AND PLAN:   1. Persistent Atrial Fibrillation: rate is controlled. Plan is for DCCV however his INRs have not been followed weekly. He will need at least 4 consecutive weeks of documented therapeutic INRs prior to undergoing cardioversion. We will have his INRs checked weekly at SNF. I have ordered for results to be faxed weekly for  pharmacist to monitor and make dose adjustments. For now continue metoprolol for rate control.   2. Chronic Combined Systolic + Diastolic CHF: with volume overload with 2+ bilateral LEE. No pulmonic rales. Renal function was stable on 02/24/15. We will recheck a BMP today and will increase lasix to 40 mg daily. Will get f/u BMP in 3 days and will plan on repeat f/u  with APP early next week. Low sodium diet ordered.    3. COPD: recent hospitalization for acute flare. Symptoms improved.   4. CAD: stable without angina.   5. HTN: BP is controlled.   6. DLD: on Vytorin.    PLAN  F/u next week with an APP for reassessment for a/c CHF.   Lyda Jester PA-C 03/11/2015 1:14 PM

## 2015-03-11 NOTE — Patient Instructions (Signed)
Medication Instructions:  Your physician has recommended you make the following change in your medication:  1. INCREASE Furosemide to 40mg  take one by mouth daily  Labwork: Your physician recommends that you have lab work today: [facility]   Your physician recommends that you have lab work Thursday (03/13/15) at faciltiy: BMP  The pt needs weekly INR checked for 4 WEEKS as he is pending possible cardioversion.   Testing/Procedures: No new orders.  Follow-Up: Your physician recommends that you schedule a follow-up appointment with PA/NP on 03/14/15 or 03/17/15.   Any Other Special Instructions Will Be Listed Below (If Applicable).     If you need a refill on your cardiac medications before your next appointment, please call your pharmacy.

## 2015-03-13 ENCOUNTER — Inpatient Hospital Stay: Payer: Medicare Other | Admitting: Family Medicine

## 2015-03-14 ENCOUNTER — Ambulatory Visit: Payer: Medicare Other | Admitting: Nurse Practitioner

## 2015-03-14 ENCOUNTER — Ambulatory Visit (INDEPENDENT_AMBULATORY_CARE_PROVIDER_SITE_OTHER): Payer: Medicare Other | Admitting: Nurse Practitioner

## 2015-03-14 ENCOUNTER — Encounter: Payer: Self-pay | Admitting: Nurse Practitioner

## 2015-03-14 VITALS — BP 112/60 | HR 78 | Ht 71.5 in | Wt 170.6 lb

## 2015-03-14 DIAGNOSIS — E785 Hyperlipidemia, unspecified: Secondary | ICD-10-CM

## 2015-03-14 DIAGNOSIS — I480 Paroxysmal atrial fibrillation: Secondary | ICD-10-CM

## 2015-03-14 DIAGNOSIS — I5022 Chronic systolic (congestive) heart failure: Secondary | ICD-10-CM

## 2015-03-14 LAB — POCT INR: INR: 1.4 — AB (ref <=1.1)

## 2015-03-14 MED ORDER — FUROSEMIDE 40 MG PO TABS: 60.0000 mg | ORAL_TABLET | Freq: Every day | ORAL | Status: DC

## 2015-03-14 NOTE — Progress Notes (Signed)
CARDIOLOGY OFFICE NOTE  Date:  03/14/2015    Jay Guzman Date of Birth: 08/22/1932 Medical Record #[id number]  PCP:  Odette Fraction, MD  Cardiologist:    Martinique  Chief Complaint  Patient presents with  . Congestive Heart Failure    3 day check - seen for Dr. Martinique    History of Present Illness: Jay Guzman is a 81 y.o. male who presents today for a follow up visit. Seen for Dr. Martinique. This is a 3 day check. He has a past medical history significant for dementia, COPD, hypertension, dyslipidemia and coronary artery disease with remote anterior MI. He had a catheterization in 2000 which showed an occluded LAD. He also has a history of apical thrombus and is on chronic or coumadin therapy for this. He is known to have chronic systolic congestive heart failure and moderate LV dysfunction with an EF of 45-50% with akinesis of the mid apical anteroseptal, anterior, inferior and apical myocardium on echo in April 2016. Chronic LV mural thrombus was noted and there is moderate pulmonary hypertension with an RVSP of 53 mmHg. He also has a history of atrial fibrillation which was recently diagnosed in September of this year. Initial plans were to attempt direct current cardioversion after 4 weeks of therapeutic INRs with Coumadin. However since that time, he has required hospitalization.  He was recently admitted to [facility] by internal medicine 02/22/15 -02/25/2015. He presented with shortness of breath and was found to have an acute COPD exacerbation. He was in atrial fibrillation but HR was well controlled. He was also noted to have minimally elevated troponin levels however this was felt to be due to demand ischemia. He denied any chest pain. There were no ischemic changes on his EKG. He did not undergo any ischemic workup. He was instructed to f/u in office for further management of his afib.   Saw Lyda Jester, PA just 3 days ago - he remained in AF with controlled  VR. Had gained weight. More swelling. Has not had weekly protimes and the facility was made aware. Lasix was increased to 40 mg a day. He was to be seen next week.  Comes back today. Here with his son. Sleepy but arousable. Son notes that his feet remain swollen. Weight is down a pound. Not smoking. Some shortness of breath but not able to quantify. Hard to say how much he gets his legs up. Plan is to return home next Friday. Home health to come - they typically do his INR's. Son is questioning the need to proceed on with cardioversion and may be leaning more towards a conservative approach.   Past Medical History  Diagnosis Date  . Coronary disease     WITH REMOTE ANTERIOR MYOCARDIAL INFARCTION  . Mural thrombus of heart (HCC)     CHRONIC  . Hypertension   . Hyperlipidemia   . CHF (congestive heart failure) ([place])   . Dementia   . A-fib ([place])   . COPD (chronic obstructive pulmonary disease) ([place])   . Shortness of breath dyspnea     Past Surgical History  Procedure Laterality Date  . Cardiac catheterization  2000  . Hernia repair      STATUS POST BILATERAL  . Abdominal aortic aneurysm repair  2008     Medications: Current Outpatient Prescriptions  Medication Sig Dispense Refill  . albuterol (PROVENTIL HFA;VENTOLIN HFA) 108 (90 BASE) MCG/ACT inhaler Inhale 2 puffs into the lungs every 6 (six) hours as needed  for wheezing or shortness of breath.    . Asenapine Maleate (SAPHRIS) 10 MG SUBL Place 10 mg under the tongue 2 (two) times daily.    . bisacodyl (DULCOLAX) 10 MG suppository Place 10 mg rectally as needed for moderate constipation.    . diclofenac sodium (VOLTAREN) 1 % GEL Apply 2 g topically 2 (two) times daily.    . digoxin (LANOXIN) 0.25 MG tablet Take 1 tablet (0.25 mg total) by mouth daily. 30 tablet 0  . donepezil (ARICEPT) 5 MG tablet Take one tablet by mouth once daily at bedtime for dementia    . doxazosin (CARDURA) 2 MG tablet 1 by mouth once daily for hypertension     . ezetimibe-simvastatin (VYTORIN) 10-40 MG tablet Take one tablet by mouth once daily at bedtime for cholesterol    . furosemide (LASIX) 40 MG tablet Take 1.5 tablets (60 mg total) by mouth daily. Take one tablet by mouth once daily for CHF 30 tablet 6  . guaiFENesin (MUCINEX) 600 MG 12 hr tablet Take 2 tablets (1,200 mg total) by mouth 2 (two) times daily.    . magnesium hydroxide (MILK OF MAGNESIA) 400 MG/5ML suspension Take 30 mLs by mouth daily as needed for mild constipation.    . metoprolol succinate (TOPROL-XL) 50 MG 24 hr tablet Take 1 tablet by mouth  daily for hypertension    . mirtazapine (REMERON) 30 MG tablet Take one tablet by mouth once daily at bedtime for sleep    . nicotine (NICODERM CQ - DOSED IN MG/24 HOURS) 21 mg/24hr patch Place 21 mg onto the skin daily.    . nitroGLYCERIN (NITROSTAT) 0.4 MG SL tablet Place 0.4 mg under the tongue every 5 (five) minutes as needed for chest pain (3X WEEKLY).    . NON FORMULARY Place 2 L into the nose continuous.    . ondansetron (ZOFRAN) 4 MG tablet Take 4 mg by mouth every 8 (eight) hours as needed for nausea or vomiting.    . Sodium Phosphates (RA SALINE ENEMA) 19-7 GM/118ML ENEM Place 1 each rectally as needed (for constipation).    . STIOLTO RESPIMAT 2.5-2.5 MCG/ACT AERS INHALE 2 PUFFS INTO THE LUNGS DAILY.  5  . traZODone (DESYREL) 50 MG tablet Take 0.5 tablets (25 mg total) by mouth at bedtime as needed for sleep.    Marland Kitchen warfarin (COUMADIN) 6 MG tablet Take 6 mg (one tablet) daily on Sunday, Tuesday, Thursday, Saturday 15 tablet 0  . warfarin (COUMADIN) 7.5 MG tablet Take 7.5 mg (one tablet) on Monday, Wednesday, Friday (Patient taking differently: Take 7.5 mg by mouth daily at 6 PM. ) 15 tablet 0   No current facility-administered medications for this visit.    Allergies: Allergies  Allergen Reactions  . Codeine Other (See Comments)    Reaction during surgical  Procedure-unknown  . Morphine And Related Other (See Comments)     Reaction unknown-during surgical procedure    Social History: The patient  reports that he has quit smoking. His smoking use included Cigarettes. He has a 60 pack-year smoking history. He does not have any smokeless tobacco history on file. He reports that he does not drink alcohol or use illicit drugs.   Family History: The patient's  family history includes Cancer in his mother and sister; Cerebral aneurysm in his father; Heart attack in his father and mother; Hypertension in his brother, father, mother, and sister; Stroke in his paternal grandfather and paternal grandmother.   Review of Systems: Please see the  history of present illness.   Otherwise, the review of systems is positive for none.   All other systems are reviewed and negative.   Physical Exam: VS:  BP 112/60 mmHg  Pulse 78  Ht 5' 11.5" (1.816 m)  Wt 170 lb 9.6 oz (77.384 kg)  BMI 23.46 kg/m2  SpO2 96% .  BMI Body mass index is 23.46 kg/(m^2).  Wt Readings from Last 3 Encounters:  03/14/15 170 lb 9.6 oz (77.384 kg)  03/11/15 171 lb (77.565 kg)  02/26/15 169 lb (76.658 kg)    General: Sleeping. He is arousable. Chronically ill. He is in no acute distress. Weight is just down one pound.  HEENT: Normal. Neck: Supple, no JVD, carotid bruits, or masses noted.  Cardiac: Irregular irregular rhythm. Rate is ok.  Heart tones are distant.  +2 edema.  Respiratory:  Lungs are coarse. GI: Soft and nontender.  MS: No deformity or atrophy. Gait and ROM intact. Skin: Warm and dry. Color is normal.  Neuro:  Strength and sensation are intact and no gross focal deficits noted.  Psych: Alert, appropriate and with normal affect.   LABORATORY DATA:  EKG:  EKG is not ordered today.  Lab Results  Component Value Date   WBC 11.1* 02/25/2015   HGB 14.1 02/25/2015   HCT 43.8 02/25/2015   PLT 87* 02/25/2015   GLUCOSE 94 03/11/2015   CHOL 124 01/01/2014   TRIG 75 01/01/2014   HDL 39* 01/01/2014   LDLCALC 70 01/01/2014   ALT 19  02/22/2015   AST 22 02/22/2015   NA 141 03/11/2015   K 4.0 03/11/2015   CL 103 03/11/2015   CREATININE 1.14* 03/11/2015   BUN 25 03/11/2015   CO2 30 03/11/2015   TSH 4.14 09/13/2013   PSA 0.89 01/01/2014   INR 2.5* 02/26/2015    BNP (last 3 results)  Recent Labs  08/26/14 1810 01/23/15 1943 02/22/15 0702  BNP 656.0* 1033.2* 690.8*    ProBNP (last 3 results) No results for input(s): PROBNP in the last 8760 hours.   Other Studies Reviewed Today:  Echo Study Conclusions from 08/2014  - Left ventricle: The cavity size was mildly dilated. Wall thickness was increased in a pattern of mild LVH. Systolic function was mildly reduced. The estimated ejection fraction was in the range of 45% to 50%. There is akinesis of the mid-apicalanteroseptal, anterior, inferior, and apical myocardium. Doppler parameters are consistent with abnormal left ventricular relaxation (grade 1 diastolic dysfunction). - Aortic valve: Mildly to moderately calcified annulus. Trileaflet; moderately calcified leaflets. There was mild regurgitation. - Mitral valve: Calcified annulus. There was mild regurgitation. - Left atrium: The atrium was moderately dilated. - Right atrium: The atrium was mildly dilated. Central venous pressure (est): 8 mm Hg. - Tricuspid valve: There was mild regurgitation. - Pulmonary arteries: PA peak pressure: 53 mm Hg (S). - Pericardium, extracardiac: There was no pericardial effusion.  Impressions:  - Mild LVH with LVEF approximately 45%, akinesis noted of the mid to distal anteroseptal, anterior, and inferior walls as well as apex consistent with ischemic cardiomyopathy. History of chronic LV mural thrombus described - not well visualized with this study (could consider microbubble contrast study if further assessment needed). Grade 1 diastolic dysfunction. MAC with mild mitral regurgitation. Sclerotic aortic valve with mild  aortic regurgitation. Mild tricuspid regurgitation with PASP 53 mmHg.  Assessment/Plan:  1. Persistent Atrial Fibrillation: rate is controlled. Plan is for DCCV however his INRs have not been followed weekly. He will need at  least 4 consecutive weeks of documented therapeutic INRs prior to undergoing cardioversion. Protimes are checked at SNF and were to be faxed here (this was ordered at his last visit here 3 days ago). Has moderate LAE on prior echo which may impact success of restoring/maintaining NSR. Son is unsure about proceeding and may be opting for rate control only with conservative measures. Going home in one week.   2. Chronic Combined Systolic + Diastolic CHF: still with edema. Weight down just a pound. Will increase his Lasix. Hard to get a sense of just how symptomatic he is. Renal function was ok earlier this week. Will recheck in one week.   3. COPD: recent hospitalization for acute flare. Symptoms improved.   4. CAD: stable without angina.   5. HTN: BP is controlled.   6. HLD: on Vytorin.   Current medicines are reviewed with the patient today.  The patient does not have concerns regarding medicines other than what has been noted above.  The following changes have been made:  See above.  Labs/ tests ordered today include:   No orders of the defined types were placed in this encounter.     Disposition:   FU with Dr. Martinique as planned in one month - would hope to arrange cardioversion at that time.    Patient is agreeable to this plan and will call if any problems develop in the interim.   Signed: Burtis Junes, RN, ANP-C 03/14/2015 11:31 AM  [facility] [address] [place], [place]  [address] Phone: [phone] Fax: [phone]

## 2015-03-14 NOTE — Patient Instructions (Addendum)
We will be checking the following labs today - NONE  Needs BMET and BMET in one week - faxed to Dr. Doug Sou attention  Weekly INRS - faxed to coumadin clinic for monitoring   Medication Instructions:    Continue with your current medicines. But  I am increasing the Lasix to 60 mg each day    Testing/Procedures To Be Arranged:  N/A  Follow-Up:   See Dr. Martinique as planned in November   Other Special Instructions:   Restrict sodium as much as possible    If you need a refill on your cardiac medications before your next appointment, please call your pharmacy.   Call the [place] office at [phone] if you have any questions, problems or concerns.

## 2015-03-15 ENCOUNTER — Emergency Department (HOSPITAL_COMMUNITY)
Admission: EM | Admit: 2015-03-15 | Discharge: 2015-03-15 | Disposition: A | Discharge status: Skilled Nursing Facility | Payer: Medicare Other | Attending: Emergency Medicine | Admitting: Emergency Medicine

## 2015-03-15 ENCOUNTER — Emergency Department (HOSPITAL_COMMUNITY): Payer: Medicare Other

## 2015-03-15 ENCOUNTER — Encounter (HOSPITAL_COMMUNITY): Payer: Self-pay | Admitting: Emergency Medicine

## 2015-03-15 DIAGNOSIS — I4891 Unspecified atrial fibrillation: Secondary | ICD-10-CM | POA: Diagnosis not present

## 2015-03-15 DIAGNOSIS — Z79899 Other long term (current) drug therapy: Secondary | ICD-10-CM | POA: Insufficient documentation

## 2015-03-15 DIAGNOSIS — Z8639 Personal history of other endocrine, nutritional and metabolic disease: Secondary | ICD-10-CM | POA: Insufficient documentation

## 2015-03-15 DIAGNOSIS — I10 Essential (primary) hypertension: Secondary | ICD-10-CM | POA: Diagnosis not present

## 2015-03-15 DIAGNOSIS — Z7901 Long term (current) use of anticoagulants: Secondary | ICD-10-CM | POA: Insufficient documentation

## 2015-03-15 DIAGNOSIS — Y998 Other external cause status: Secondary | ICD-10-CM | POA: Insufficient documentation

## 2015-03-15 DIAGNOSIS — N390 Urinary tract infection, site not specified: Secondary | ICD-10-CM | POA: Diagnosis not present

## 2015-03-15 DIAGNOSIS — J449 Chronic obstructive pulmonary disease, unspecified: Secondary | ICD-10-CM | POA: Insufficient documentation

## 2015-03-15 DIAGNOSIS — I509 Heart failure, unspecified: Secondary | ICD-10-CM | POA: Insufficient documentation

## 2015-03-15 DIAGNOSIS — S199XXA Unspecified injury of neck, initial encounter: Secondary | ICD-10-CM | POA: Diagnosis not present

## 2015-03-15 DIAGNOSIS — W01198A Fall on same level from slipping, tripping and stumbling with subsequent striking against other object, initial encounter: Secondary | ICD-10-CM | POA: Diagnosis not present

## 2015-03-15 DIAGNOSIS — Z792 Long term (current) use of antibiotics: Secondary | ICD-10-CM | POA: Diagnosis not present

## 2015-03-15 DIAGNOSIS — Z9889 Other specified postprocedural states: Secondary | ICD-10-CM | POA: Insufficient documentation

## 2015-03-15 DIAGNOSIS — Y9389 Activity, other specified: Secondary | ICD-10-CM | POA: Diagnosis not present

## 2015-03-15 DIAGNOSIS — S50311A Abrasion of right elbow, initial encounter: Secondary | ICD-10-CM | POA: Insufficient documentation

## 2015-03-15 DIAGNOSIS — S0990XA Unspecified injury of head, initial encounter: Secondary | ICD-10-CM | POA: Insufficient documentation

## 2015-03-15 DIAGNOSIS — S59901A Unspecified injury of right elbow, initial encounter: Secondary | ICD-10-CM | POA: Diagnosis present

## 2015-03-15 DIAGNOSIS — Z87891 Personal history of nicotine dependence: Secondary | ICD-10-CM | POA: Insufficient documentation

## 2015-03-15 DIAGNOSIS — S3992XA Unspecified injury of lower back, initial encounter: Secondary | ICD-10-CM | POA: Insufficient documentation

## 2015-03-15 DIAGNOSIS — F0390 Unspecified dementia without behavioral disturbance: Secondary | ICD-10-CM | POA: Diagnosis not present

## 2015-03-15 DIAGNOSIS — Y9289 Other specified places as the place of occurrence of the external cause: Secondary | ICD-10-CM | POA: Insufficient documentation

## 2015-03-15 DIAGNOSIS — W19XXXA Unspecified fall, initial encounter: Secondary | ICD-10-CM

## 2015-03-15 DIAGNOSIS — R52 Pain, unspecified: Secondary | ICD-10-CM

## 2015-03-15 LAB — URINE MICROSCOPIC-ADD ON

## 2015-03-15 LAB — URINALYSIS, ROUTINE W REFLEX MICROSCOPIC
Bilirubin Urine: NEGATIVE
Glucose, UA: NEGATIVE mg/dL
Hgb urine dipstick: NEGATIVE
Ketones, ur: NEGATIVE mg/dL
Nitrite: POSITIVE — AB
Protein, ur: NEGATIVE mg/dL
Specific Gravity, Urine: 1.017 (ref 1.005–1.030)
Urobilinogen, UA: 1 mg/dL (ref 0.0–1.0)
pH: 5 (ref 5.0–8.0)

## 2015-03-15 MED ORDER — CEPHALEXIN 500 MG PO CAPS
500.0000 mg | ORAL_CAPSULE | Freq: Once | ORAL | Status: AC
  Administered 2015-03-15: 500 mg via ORAL
  Filled 2015-03-15: qty 1

## 2015-03-15 MED ORDER — CEPHALEXIN 500 MG PO CAPS: 500.0000 mg | ORAL_CAPSULE | Freq: Three times a day (TID) | ORAL | Status: DC

## 2015-03-15 NOTE — ED Notes (Signed)
[facility] called for transport.  

## 2015-03-15 NOTE — ED Notes (Signed)
Bed: XI33 Expected date:  Expected time:  Means of arrival:  Comments: Witnessed fall, no LOC

## 2015-03-15 NOTE — ED Notes (Signed)
MD at bedside. 

## 2015-03-15 NOTE — ED Provider Notes (Signed)
CSN: [id number]     Arrival date & time 03/15/15  1642 History   First MD Initiated Contact with Patient 03/15/15 1659     Chief Complaint  Patient presents with  . Fall     (Consider location/radiation/quality/duration/timing/severity/associated sxs/prior Treatment) HPI  Mechanical fall without walker, witnessed, no LOC, right elbow pain, on blood thinners, c collar already placed. Some back pain as well. No other symptoms. Son thinks his UTI might have come back because he falls when it happens.   Past Medical History  Diagnosis Date  . Coronary disease     WITH REMOTE ANTERIOR MYOCARDIAL INFARCTION  . [place] thrombus of heart (HCC)     CHRONIC  . Hypertension   . Hyperlipidemia   . CHF (congestive heart failure) ([place])   . Dementia   . A-fib ([place])   . COPD (chronic obstructive pulmonary disease) ([place])   . Shortness of breath dyspnea    Past Surgical History  Procedure Laterality Date  . Cardiac catheterization  2000  . Hernia repair      STATUS POST BILATERAL  . Abdominal aortic aneurysm repair  2008   Family History  Problem Relation Age of Onset  . Cerebral aneurysm Father   . Cancer Sister   . Cancer Mother   . Heart attack Mother   . Heart attack Father   . Stroke Paternal Grandmother   . Stroke Paternal Grandfather   . Hypertension Mother   . Hypertension Father   . Hypertension Sister   . Hypertension Brother    Social History  Substance Use Topics  . Smoking status: Former Smoker -- 1.00 packs/day for 60 years    Types: Cigarettes  . Smokeless tobacco: None  . Alcohol Use: No    Review of Systems  Constitutional: Negative for fever, chills and activity change.  HENT: Negative for congestion and rhinorrhea.   Eyes: Negative for visual disturbance.  Respiratory: Negative for cough and shortness of breath.   Cardiovascular: Negative for chest pain.  Gastrointestinal: Negative for vomiting, abdominal pain, diarrhea and constipation.  Endocrine:  Negative for polyuria.  Genitourinary: Negative for dysuria and flank pain.  Musculoskeletal: Positive for back pain and neck pain.  Skin: Negative for wound.  Neurological: Positive for headaches.  All other systems reviewed and are negative.     Allergies  Codeine and Morphine and related  Home Medications   Prior to Admission medications   Medication Sig Start Date End Date Taking? Authorizing Provider  albuterol (PROVENTIL HFA;VENTOLIN HFA) 108 (90 BASE) MCG/ACT inhaler Inhale 2 puffs into the lungs every 6 (six) hours as needed for wheezing or shortness of breath.   Yes Historical Provider, MD  albuterol (PROVENTIL) (2.5 MG/3ML) 0.083% nebulizer solution Take 2.5 mg by nebulization 3 (three) times daily.   Yes Historical Provider, MD  albuterol (PROVENTIL) (2.5 MG/3ML) 0.083% nebulizer solution Take 2.5 mg by nebulization every 4 (four) hours as needed for wheezing or shortness of breath.   Yes Historical Provider, MD  Asenapine Maleate (SAPHRIS) 10 MG SUBL Place 10 mg under the tongue 2 (two) times daily.   Yes Historical Provider, MD  bisacodyl (DULCOLAX) 10 MG suppository Place 10 mg rectally as needed for moderate constipation.   Yes Historical Provider, MD  digoxin (LANOXIN) 0.25 MG tablet Take 1 tablet (0.25 mg total) by mouth daily. 01/30/15  Yes Orson Eva, MD  donepezil (ARICEPT) 5 MG tablet Take 5 mg by mouth at bedtime.    Yes Historical Provider, MD  doxazosin (CARDURA) 2 MG tablet Take 2 mg by mouth daily.  02/27/15  Yes Mahima Pandey, MD  ezetimibe-simvastatin (VYTORIN) 10-40 MG tablet Take 1 tablet by mouth at bedtime.  01/10/15  Yes Peter M Martinique, MD  furosemide (LASIX) 20 MG tablet Take 60 mg by mouth daily.   Yes Historical Provider, MD  guaiFENesin (MUCINEX) 600 MG 12 hr tablet Take 2 tablets (1,200 mg total) by mouth 2 (two) times daily. 02/25/15  Yes Bonnielee Haff, MD  magnesium hydroxide (MILK OF MAGNESIA) 400 MG/5ML suspension Take 30 mLs by mouth daily as  needed for mild constipation.   Yes Historical Provider, MD  metoprolol succinate (TOPROL-XL) 50 MG 24 hr tablet Take 50 mg by mouth daily.  02/27/15  Yes Mahima Pandey, MD  mirtazapine (REMERON) 30 MG tablet Take 30 mg by mouth at bedtime.    Yes Historical Provider, MD  nicotine (NICODERM CQ - DOSED IN MG/24 HOURS) 21 mg/24hr patch Place 21 mg onto the skin daily.   Yes Historical Provider, MD  nitroGLYCERIN (NITROSTAT) 0.4 MG SL tablet Place 0.4 mg under the tongue every 5 (five) minutes as needed for chest pain.    Yes Historical Provider, MD  ondansetron (ZOFRAN) 4 MG tablet Take 4 mg by mouth every 8 (eight) hours as needed for nausea or vomiting.   Yes Historical Provider, MD  OXYGEN Inhale 2 L into the lungs continuous.   Yes Historical Provider, MD  Sodium Phosphates (RA SALINE ENEMA) 19-7 GM/118ML ENEM Place 1 each rectally as needed (for constipation).   Yes Historical Provider, MD  STIOLTO RESPIMAT 2.5-2.5 MCG/ACT AERS INHALE 2 PUFFS INTO THE LUNGS DAILY. 01/07/15  Yes Historical Provider, MD  tuberculin (TUBERSOL) 5 UNIT/0.1ML injection Inject 0.1 mLs into the skin once.   Yes Historical Provider, MD  warfarin (COUMADIN) 1 MG tablet Take 1 mg by mouth daily. Take along with 6mg  to equal a total of 7mg  per day   Yes Historical Provider, MD  warfarin (COUMADIN) 6 MG tablet Take 6 mg (one tablet) daily on Sunday, Tuesday, Thursday, Saturday Patient taking differently: Take 6 mg by mouth daily. Take along with 1mg  to equal a total of 7mg  per day 01/30/15  Yes Orson Eva, MD  zinc oxide (BALMEX) 11.3 % CREA cream Apply 1 application topically 2 (two) times daily.   Yes Historical Provider, MD  cephALEXin (KEFLEX) 500 MG capsule Take 1 capsule (500 mg total) by mouth 3 (three) times daily. 03/15/15   Merrily Pew, MD  furosemide (LASIX) 40 MG tablet Take 1.5 tablets (60 mg total) by mouth daily. Take one tablet by mouth once daily for CHF Patient not taking: Reported on 03/15/2015 03/14/15   Burtis Junes, NP  traZODone (DESYREL) 50 MG tablet Take 0.5 tablets (25 mg total) by mouth at bedtime as needed for sleep. Patient not taking: Reported on 03/15/2015 02/25/15   Bonnielee Haff, MD  warfarin (COUMADIN) 7.5 MG tablet Take 7.5 mg (one tablet) on Monday, Wednesday, Friday Patient not taking: Reported on 03/15/2015 01/30/15   Orson Eva, MD   BP 108/52 mmHg  Pulse 108  Temp(Src) 99.4 F (37.4 C) (Oral)  Resp 17  SpO2 97% Physical Exam  Constitutional: He appears well-developed and well-nourished.  HENT:  Head: Normocephalic and atraumatic.  Neck: Normal range of motion.  Cardiovascular: Normal rate.   Pulmonary/Chest: Effort normal. No respiratory distress.  Abdominal: He exhibits no distension.  Musculoskeletal: Normal range of motion. He exhibits tenderness (upper thoracic, lower cervical).  Neurological: He is alert.  Skin:  Abrasion over right elbow  Nursing note and vitals reviewed.   ED Course  Procedures (including critical care time) Labs Review Labs Reviewed  URINALYSIS, ROUTINE W REFLEX MICROSCOPIC (NOT AT [facility]) - Abnormal; Notable for the following:    Color, Urine AMBER (*)    APPearance CLOUDY (*)    Nitrite POSITIVE (*)    Leukocytes, UA MODERATE (*)    All other components within normal limits  URINE MICROSCOPIC-ADD ON - Abnormal; Notable for the following:    Bacteria, UA FEW (*)    Casts HYALINE CASTS (*)    All other components within normal limits  URINE CULTURE    Imaging Review Dg Thoracic Spine W/swimmers  03/15/2015  CLINICAL DATA:  81 year old male with a history of fall. Thoracic pain EXAM: THORACIC SPINE - 3 VIEWS COMPARISON:  Chest x-ray 02/23/2015, 01/23/2015 FINDINGS: Thoracic Spine: Thoracic vertebral elements maintain normal anatomic alignment, with no evidence of anterolisthesis, retrolisthesis, or subluxation. No acute fracture line identified. Vertebral body heights maintained. Disc space narrowing with endplate changes  throughout the thoracic spine. Calcifications of the thoracic aorta. Chronic changes of the lungs again visualized. IMPRESSION: Negative for acute fracture or malalignment of the thoracic spine. Atherosclerosis Signed, Dulcy Fanny. Earleen Newport, DO [facility] Electronically Signed   By: Corrie Mckusick D.O.   On: 03/15/2015 19:07   Dg Elbow Complete Right  03/15/2015  CLINICAL DATA:  Fall today, pain right elbow and upper back. EXAM: RIGHT ELBOW - COMPLETE 3+ VIEW COMPARISON:  None. FINDINGS: Osseous alignment is normal. No fracture line or displaced fracture fragment seen. Degenerative spurring is present along the posterior margin of the olecranon and at the lateral femoral condyle. Lateral view positioning is suboptimal but there is no obvious evidence of joint effusion. Superficial soft tissues about the right elbow are unremarkable. IMPRESSION: Chronic/degenerative findings described above. No acute findings.  No fracture or dislocation. Electronically Signed   By: Franki Cabot M.D.   On: 03/15/2015 19:07   Ct Head Wo Contrast  03/15/2015  CLINICAL DATA:  Fall with head injury in a patient on Coumadin therapy. Patient complains of neck pain. EXAM: CT HEAD WITHOUT CONTRAST CT CERVICAL SPINE WITHOUT CONTRAST TECHNIQUE: Multidetector CT imaging of the head and cervical spine was performed following the standard protocol without intravenous contrast. Multiplanar CT image reconstructions of the cervical spine were also generated. COMPARISON:  03/05/2015 FINDINGS: CT HEAD FINDINGS No mass effect or midline shift. No evidence of acute intracranial hemorrhage, or infarction. No abnormal extra-axial fluid collections. There is moderate brain parenchymal atrophy and chronic small vessel disease changes. Vascular calcifications are noted at the skullbase. Basal cisterns are preserved. No depressed skull fractures. Visualized paranasal sinuses and mastoid air cells  are not opacified. CT CERVICAL SPINE FINDINGS There is straightening of the cervical lordosis, which may be positional. There is no evidence for acute fracture or dislocation. Moderate to severe osteoarthritic changes are seen at all levels of the cervical spine, worse at C3-C4 and C5-C6, with disc space narrowing, discogenic endplate sclerosis and anterior and posterior osteophytes. Prevertebral soft tissues have a normal appearance. Lung apices have a normal appearance. Carotid artery calcifications are seen. IMPRESSION: No evidence of acute traumatic injury to the head. Moderate brain parenchymal atrophy and changes of chronic microvascular disease. No evidence of acute traumatic injury to the cervical spine. Multilevel osteoarthritic changes of the cervical spine. Calcified atherosclerotic disease. Electronically Signed   By:  Fidela Salisbury M.D.   On: 03/15/2015 18:39   Ct Cervical Spine Wo Contrast  03/15/2015  CLINICAL DATA:  Fall with head injury in a patient on Coumadin therapy. Patient complains of neck pain. EXAM: CT HEAD WITHOUT CONTRAST CT CERVICAL SPINE WITHOUT CONTRAST TECHNIQUE: Multidetector CT imaging of the head and cervical spine was performed following the standard protocol without intravenous contrast. Multiplanar CT image reconstructions of the cervical spine were also generated. COMPARISON:  03/05/2015 FINDINGS: CT HEAD FINDINGS No mass effect or midline shift. No evidence of acute intracranial hemorrhage, or infarction. No abnormal extra-axial fluid collections. There is moderate brain parenchymal atrophy and chronic small vessel disease changes. Vascular calcifications are noted at the skullbase. Basal cisterns are preserved. No depressed skull fractures. Visualized paranasal sinuses and mastoid air cells are not opacified. CT CERVICAL SPINE FINDINGS There is straightening of the cervical lordosis, which may be positional. There is no evidence for acute fracture or dislocation.  Moderate to severe osteoarthritic changes are seen at all levels of the cervical spine, worse at C3-C4 and C5-C6, with disc space narrowing, discogenic endplate sclerosis and anterior and posterior osteophytes. Prevertebral soft tissues have a normal appearance. Lung apices have a normal appearance. Carotid artery calcifications are seen. IMPRESSION: No evidence of acute traumatic injury to the head. Moderate brain parenchymal atrophy and changes of chronic microvascular disease. No evidence of acute traumatic injury to the cervical spine. Multilevel osteoarthritic changes of the cervical spine. Calcified atherosclerotic disease. Electronically Signed   By: Fidela Salisbury M.D.   On: 03/15/2015 18:39   I have personally reviewed and evaluated these images and lab results as part of my medical decision-making.   EKG Interpretation None      MDM   Final diagnoses:  Fall, initial encounter  UTI (lower urinary tract infection)   Mechanical fall. No LOC. Will ct and xr affected body parts.   XR's and CT's negative. Does have UTI. Will start keflex and ask for pcp follow up in 3 days. otherwise stable for dc and transfer back to facility.   I have personally and contemperaneously reviewed labs and imaging and used in my decision making as above.   A medical screening exam was performed and I feel the patient has had an appropriate workup for their chief complaint at this time and likelihood of emergent condition existing is low. They have been counseled on decision, discharge, follow up and which symptoms necessitate immediate return to the emergency department. They or their family verbally stated understanding and agreement with plan and discharged in stable condition.      Merrily Pew, MD 03/16/15 [phone]

## 2015-03-15 NOTE — ED Notes (Signed)
Pt arrived via EMS from [facility] [place] with report of witnessed fall. Pt tripped and fell hitting head but no LOC, dizziness/lightheadedness, visual disturbances, or headache. Pt reported lower neck and back pain. No hematoma noted. Pt with c-collar on. Pt currently taking Coumadin.

## 2015-03-17 ENCOUNTER — Non-Acute Institutional Stay (SKILLED_NURSING_FACILITY): Payer: Medicare Other | Admitting: Nurse Practitioner

## 2015-03-17 ENCOUNTER — Encounter: Payer: Self-pay | Admitting: Nurse Practitioner

## 2015-03-17 DIAGNOSIS — I481 Persistent atrial fibrillation: Secondary | ICD-10-CM | POA: Diagnosis not present

## 2015-03-17 DIAGNOSIS — N390 Urinary tract infection, site not specified: Secondary | ICD-10-CM | POA: Diagnosis not present

## 2015-03-17 DIAGNOSIS — Z7901 Long term (current) use of anticoagulants: Secondary | ICD-10-CM

## 2015-03-17 DIAGNOSIS — M545 Low back pain: Secondary | ICD-10-CM

## 2015-03-17 DIAGNOSIS — Z5181 Encounter for therapeutic drug level monitoring: Secondary | ICD-10-CM

## 2015-03-17 DIAGNOSIS — I4819 Other persistent atrial fibrillation: Secondary | ICD-10-CM

## 2015-03-17 NOTE — Progress Notes (Signed)
Patient ID: Jay Guzman, male   DOB: Feb 28, 1933, 81 y.o.   MRN: [id number]    Nursing Home Location:  [place] of Service: SNF (31)  PCP: Odette Fraction, MD  Allergies  Allergen Reactions  . Codeine Other (See Comments)    Reaction during surgical  Procedure-unknown  . Morphine And Related Other (See Comments)    Reaction unknown-during surgical procedure    Chief Complaint  Patient presents with  . Acute Visit    Acute concerns     HPI:  Patient is a 81 y.o. male seen today at [facility] due to acute concerns from staff. Pt with a pmh of He has PMH of dementia, COPD, chf, afib. Over the weekend pt had a fall. Was sent to the ED for evaluation. Negative for fracture or acute finding on CT. Pt was found to have UTI and placed on keflex. Staff notes that patient in a lot of pain with movement and dressing. Did not participate in therapy today. Pt with dementia therefore a poor historian. No abnormal bruising or swelling noted.  Pt in bed resting at the time of visit.  Review of Systems:  Review of Systems  Unable to perform ROS: Dementia    Past Medical History  Diagnosis Date  . Coronary disease     WITH REMOTE ANTERIOR MYOCARDIAL INFARCTION  . [place] thrombus of heart (HCC)     CHRONIC  . Hypertension   . Hyperlipidemia   . CHF (congestive heart failure) ([place])   . Dementia   . A-fib ([place])   . COPD (chronic obstructive pulmonary disease) ([place])   . Shortness of breath dyspnea    Past Surgical History  Procedure Laterality Date  . Cardiac catheterization  2000  . Hernia repair      STATUS POST BILATERAL  . Abdominal aortic aneurysm repair  2008   Social History:   reports that he has quit smoking. His smoking use included Cigarettes. He has a 60 pack-year smoking history. He does not have any smokeless tobacco history on file. He reports that he does not drink alcohol or use illicit drugs.  Family History    Problem Relation Age of Onset  . Cerebral aneurysm Father   . Cancer Sister   . Cancer Mother   . Heart attack Mother   . Heart attack Father   . Stroke Paternal Grandmother   . Stroke Paternal Grandfather   . Hypertension Mother   . Hypertension Father   . Hypertension Sister   . Hypertension Brother     Medications: Patient's Medications  New Prescriptions   No medications on file  Previous Medications   ALBUTEROL (PROVENTIL) (2.5 MG/3ML) 0.083% NEBULIZER SOLUTION    Take 2.5 mg by nebulization every 4 (four) hours as needed (and three times daily scheduled).    ASENAPINE MALEATE (SAPHRIS) 10 MG SUBL    Place 10 mg under the tongue 2 (two) times daily.   BISACODYL (DULCOLAX) 10 MG SUPPOSITORY    Place 10 mg rectally as needed for moderate constipation.   CEPHALEXIN (KEFLEX) 500 MG CAPSULE    Take 1 capsule (500 mg total) by mouth 3 (three) times daily.   DICLOFENAC SODIUM (VOLTAREN) 1 % GEL    Apply 2 g topically 2 (two) times daily.   DIGOXIN (LANOXIN) 0.25 MG TABLET    Take 1 tablet (0.25 mg total) by mouth daily.   DONEPEZIL (ARICEPT) 5 MG TABLET  Take 5 mg by mouth at bedtime.    DOXAZOSIN (CARDURA) 2 MG TABLET    Take 2 mg by mouth daily.    EZETIMIBE-SIMVASTATIN (VYTORIN) 10-40 MG TABLET    Take 1 tablet by mouth at bedtime.    FUROSEMIDE (LASIX) 40 MG TABLET    Take 1.5 tablets (60 mg total) by mouth daily. Take one tablet by mouth once daily for CHF   GUAIFENESIN (MUCINEX) 600 MG 12 HR TABLET    Take 2 tablets (1,200 mg total) by mouth 2 (two) times daily.   MAGNESIUM HYDROXIDE (MILK OF MAGNESIA) 400 MG/5ML SUSPENSION    Take 30 mLs by mouth daily as needed for mild constipation.   METOPROLOL SUCCINATE (TOPROL-XL) 50 MG 24 HR TABLET    Take 50 mg by mouth daily.    MIRTAZAPINE (REMERON) 30 MG TABLET    Take 30 mg by mouth at bedtime.    NICOTINE (NICODERM CQ - DOSED IN MG/24 HOURS) 21 MG/24HR PATCH    Place 21 mg onto the skin daily.   NITROGLYCERIN (NITROSTAT) 0.4 MG  SL TABLET    Place 0.4 mg under the tongue every 5 (five) minutes as needed for chest pain.    ONDANSETRON (ZOFRAN) 4 MG TABLET    Take 4 mg by mouth every 8 (eight) hours as needed for nausea or vomiting.   OXYGEN    Inhale 2 L into the lungs continuous.   SODIUM PHOSPHATES (RA SALINE ENEMA) 19-7 GM/118ML ENEM    Place 1 each rectally as needed (for constipation).   STIOLTO RESPIMAT 2.5-2.5 MCG/ACT AERS    INHALE 2 PUFFS INTO THE LUNGS DAILY.   WARFARIN (COUMADIN) 1 MG TABLET    Take 1 mg by mouth daily. Take along with 6mg  to equal a total of 7mg  per day   WARFARIN (COUMADIN) 6 MG TABLET    Take 6 mg by mouth daily. Take with 1 mg for a total of 7 mg daily  Modified Medications   No medications on file  Discontinued Medications   ALBUTEROL (PROVENTIL HFA;VENTOLIN HFA) 108 (90 BASE) MCG/ACT INHALER    Inhale 2 puffs into the lungs every 6 (six) hours as needed for wheezing or shortness of breath.   ALBUTEROL (PROVENTIL) (2.5 MG/3ML) 0.083% NEBULIZER SOLUTION    Take 2.5 mg by nebulization every 4 (four) hours as needed for wheezing or shortness of breath.   FUROSEMIDE (LASIX) 20 MG TABLET    Take 60 mg by mouth daily.   TRAZODONE (DESYREL) 50 MG TABLET    Take 0.5 tablets (25 mg total) by mouth at bedtime as needed for sleep.   TUBERCULIN (TUBERSOL) 5 UNIT/0.1ML INJECTION    Inject 0.1 mLs into the skin once.   WARFARIN (COUMADIN) 6 MG TABLET    Take 6 mg (one tablet) daily on Sunday, Tuesday, Thursday, Saturday   WARFARIN (COUMADIN) 7.5 MG TABLET    Take 7.5 mg (one tablet) on Monday, Wednesday, Friday   ZINC OXIDE (BALMEX) 11.3 % CREA CREAM    Apply 1 application topically 2 (two) times daily.     Physical Exam: Filed Vitals:   03/17/15 1458  BP: 147/88  Pulse: 70  Temp: 97.4 F (36.3 C)  Resp: 20  SpO2: 94%    Physical Exam  Constitutional: He is oriented to person, place, and time. No distress.  Frail elderly male  HENT:  Head: Normocephalic and atraumatic.  Mouth/Throat:  Oropharynx is clear and moist. No oropharyngeal exudate.  Eyes:  Conjunctivae and EOM are normal. Pupils are equal, round, and reactive to light.  Neck: Normal range of motion. Neck supple.  Cardiovascular: Normal rate, regular rhythm and normal heart sounds.   Pulmonary/Chest: Effort normal. He has no wheezes.  diminished in the bases  Abdominal: Soft. Bowel sounds are normal.  Musculoskeletal: He exhibits tenderness (noted to lumbar spine and paraspinal muscle bilaterally). He exhibits no edema.  Neurological: He is alert and oriented to person, place, and time.  Skin: Skin is warm and dry. He is not diaphoretic.  Psychiatric: He has a normal mood and affect.  Memory loss    Labs reviewed: Basic Metabolic Panel:  Recent Labs  01/26/15 0547 01/29/15 0308  02/23/15 0300 02/24/15 0419 03/04/15 03/11/15 1302  NA 144 139  < > 144 141 142 141  K 4.5 4.3  < > 4.4 4.4 4.0 4.0  CL 103 93*  < > 104 104  --  103  CO2 35* 39*  < > 30 31  --  30  GLUCOSE 112* 105*  < > 153* 158*  --  94  BUN 28* 38*  < > 32* 39* 29* 25  CREATININE 1.24 1.38*  < > 1.11 1.20 0.9 1.14*  CALCIUM 8.4* 8.6*  < > 8.6* 8.4*  --  8.5*  MG 2.3 2.3  --   --  2.2  --   --   < > = values in this interval not displayed. Liver Function Tests:  Recent Labs  12/25/14 1405 01/10/15 1014 02/22/15 1037  AST 21 33 22  ALT 17 49* 19  ALKPHOS 49 47 49  BILITOT 1.7* 1.2 1.9*  PROT 5.7* 4.9* 5.3*  ALBUMIN 3.5 3.3* 3.2*   No results for input(s): LIPASE, AMYLASE in the last 8760 hours.  Recent Labs  02/22/15 1038  AMMONIA 18   CBC:  Recent Labs  12/25/14 1405 01/10/15 1014  02/22/15 0702 02/24/15 0419 02/25/15 0411 03/04/15  WBC 8.7 7.6  < > 9.3 9.7 11.1* 8.0  NEUTROABS 6.7 5.7  --  6.7  --   --   --   HGB 17.1* 16.1  < > 15.9 14.4 14.1 14.1  HCT 51.2 49.5  < > 48.5 44.7 43.8 44  MCV 101.2* 100.6*  < > 102.1* 101.6* 101.6*  --   PLT 119* 121*  < > 111* 89* 87* 106*  < > = values in this interval not  displayed. TSH: No results for input(s): TSH in the last 8760 hours. A1C: No results found for: HGBA1C Lipid Panel: No results for input(s): CHOL, HDL, LDLCALC, TRIG, CHOLHDL, LDLDIRECT in the last 8760 hours.  Radiological Exams: Dg Thoracic Spine W/swimmers  03/15/2015  CLINICAL DATA:  81 year old male with a history of fall. Thoracic pain EXAM: THORACIC SPINE - 3 VIEWS COMPARISON:  Chest x-ray 02/23/2015, 01/23/2015 FINDINGS: Thoracic Spine: Thoracic vertebral elements maintain normal anatomic alignment, with no evidence of anterolisthesis, retrolisthesis, or subluxation. No acute fracture line identified. Vertebral body heights maintained. Disc space narrowing with endplate changes throughout the thoracic spine. Calcifications of the thoracic aorta. Chronic changes of the lungs again visualized. IMPRESSION: Negative for acute fracture or malalignment of the thoracic spine. Atherosclerosis Signed, Dulcy Fanny. Earleen Newport, DO [facility] Electronically Signed   By: Corrie Mckusick D.O.   On: 03/15/2015 19:07   Dg Elbow Complete Right  03/15/2015  CLINICAL DATA:  Fall today, pain right elbow and upper back. EXAM: RIGHT ELBOW - COMPLETE  3+ VIEW COMPARISON:  None. FINDINGS: Osseous alignment is normal. No fracture line or displaced fracture fragment seen. Degenerative spurring is present along the posterior margin of the olecranon and at the lateral femoral condyle. Lateral view positioning is suboptimal but there is no obvious evidence of joint effusion. Superficial soft tissues about the right elbow are unremarkable. IMPRESSION: Chronic/degenerative findings described above. No acute findings.  No fracture or dislocation. Electronically Signed   By: Franki Cabot M.D.   On: 03/15/2015 19:07   Ct Head Wo Contrast  03/15/2015  CLINICAL DATA:  Fall with head injury in a patient on Coumadin therapy. Patient complains of neck pain. EXAM: CT HEAD WITHOUT  CONTRAST CT CERVICAL SPINE WITHOUT CONTRAST TECHNIQUE: Multidetector CT imaging of the head and cervical spine was performed following the standard protocol without intravenous contrast. Multiplanar CT image reconstructions of the cervical spine were also generated. COMPARISON:  03/05/2015 FINDINGS: CT HEAD FINDINGS No mass effect or midline shift. No evidence of acute intracranial hemorrhage, or infarction. No abnormal extra-axial fluid collections. There is moderate brain parenchymal atrophy and chronic small vessel disease changes. Vascular calcifications are noted at the skullbase. Basal cisterns are preserved. No depressed skull fractures. Visualized paranasal sinuses and mastoid air cells are not opacified. CT CERVICAL SPINE FINDINGS There is straightening of the cervical lordosis, which may be positional. There is no evidence for acute fracture or dislocation. Moderate to severe osteoarthritic changes are seen at all levels of the cervical spine, worse at C3-C4 and C5-C6, with disc space narrowing, discogenic endplate sclerosis and anterior and posterior osteophytes. Prevertebral soft tissues have a normal appearance. Lung apices have a normal appearance. Carotid artery calcifications are seen. IMPRESSION: No evidence of acute traumatic injury to the head. Moderate brain parenchymal atrophy and changes of chronic microvascular disease. No evidence of acute traumatic injury to the cervical spine. Multilevel osteoarthritic changes of the cervical spine. Calcified atherosclerotic disease. Electronically Signed   By: Fidela Salisbury M.D.   On: 03/15/2015 18:39   Ct Cervical Spine Wo Contrast  03/15/2015  CLINICAL DATA:  Fall with head injury in a patient on Coumadin therapy. Patient complains of neck pain. EXAM: CT HEAD WITHOUT CONTRAST CT CERVICAL SPINE WITHOUT CONTRAST TECHNIQUE: Multidetector CT imaging of the head and cervical spine was performed following the standard protocol without intravenous  contrast. Multiplanar CT image reconstructions of the cervical spine were also generated. COMPARISON:  03/05/2015 FINDINGS: CT HEAD FINDINGS No mass effect or midline shift. No evidence of acute intracranial hemorrhage, or infarction. No abnormal extra-axial fluid collections. There is moderate brain parenchymal atrophy and chronic small vessel disease changes. Vascular calcifications are noted at the skullbase. Basal cisterns are preserved. No depressed skull fractures. Visualized paranasal sinuses and mastoid air cells are not opacified. CT CERVICAL SPINE FINDINGS There is straightening of the cervical lordosis, which may be positional. There is no evidence for acute fracture or dislocation. Moderate to severe osteoarthritic changes are seen at all levels of the cervical spine, worse at C3-C4 and C5-C6, with disc space narrowing, discogenic endplate sclerosis and anterior and posterior osteophytes. Prevertebral soft tissues have a normal appearance. Lung apices have a normal appearance. Carotid artery calcifications are seen. IMPRESSION: No evidence of acute traumatic injury to the head. Moderate brain parenchymal atrophy and changes of chronic microvascular disease. No evidence of acute traumatic injury to the cervical spine. Multilevel osteoarthritic changes of the cervical spine. Calcified atherosclerotic disease. Electronically Signed   By: Fidela Salisbury M.D.   On:  03/15/2015 18:39    Assessment/Plan 1. UTI (lower urinary tract infection) Placed on keflex for 7 days from ED, awaiting culture report  2. Midline low back pain, with sciatica presence unspecified Worsening pain to lumbar spine. xrays not obtained during visit to ED of lumbar spine. Will order xray lumbar spine at this time.  -to use tylenol 650 mg q 6 hours as needed for pain  3. Persistent atrial fibrillation (HCC) Rate controlled, remains on coumadin for anticoagulation  4. Anticoagulation goal of INR 2 to 3 No signs of  abnormal bleeding. INR of 1.5 today, currently on coumadin 7 mg will increased to 8 mg and recheck in 1 week     Tatayana Beshears K. Harle Battiest  [facility] [phone] 8 am - 5 pm) [phone] (after hours)

## 2015-03-18 LAB — URINE CULTURE

## 2015-03-19 ENCOUNTER — Telehealth (HOSPITAL_COMMUNITY): Payer: Self-pay

## 2015-03-19 ENCOUNTER — Non-Acute Institutional Stay (SKILLED_NURSING_FACILITY): Payer: Medicare Other | Admitting: Nurse Practitioner

## 2015-03-19 DIAGNOSIS — N390 Urinary tract infection, site not specified: Secondary | ICD-10-CM | POA: Diagnosis not present

## 2015-03-19 DIAGNOSIS — R0989 Other specified symptoms and signs involving the circulatory and respiratory systems: Secondary | ICD-10-CM

## 2015-03-19 DIAGNOSIS — R5383 Other fatigue: Secondary | ICD-10-CM | POA: Diagnosis not present

## 2015-03-19 DIAGNOSIS — M545 Low back pain: Secondary | ICD-10-CM

## 2015-03-19 NOTE — Progress Notes (Signed)
ED Antimicrobial Stewardship Positive Culture Follow Up   Jay Guzman is an 81 y.o. male who presented to [facility] on 03/15/2015 with a chief complaint of  Chief Complaint  Patient presents with  . Fall    Recent Results (from the past 62 hour(s))  Urine culture     Status: None   Collection Time: 02/23/15  5:04 AM  Result Value Ref Range Status   Specimen Description URINE, RANDOM  Final   Special Requests NONE  Final   Culture MULTIPLE SPECIES PRESENT, SUGGEST RECOLLECTION  Final   Report Status 02/24/2015 FINAL  Final  MRSA PCR Screening     Status: None   Collection Time: 02/23/15  4:17 PM  Result Value Ref Range Status   MRSA by PCR NEGATIVE NEGATIVE Final    Comment:        The GeneXpert MRSA Assay (FDA approved for NASAL specimens only), is one component of a comprehensive MRSA colonization surveillance program. It is not intended to diagnose MRSA infection nor to guide or monitor treatment for MRSA infections.   Urine culture     Status: None   Collection Time: 03/15/15  7:40 PM  Result Value Ref Range Status   Specimen Description URINE, RANDOM  Final   Special Requests NONE  Final   Culture   Final    >=100,000 COLONIES/mL PSEUDOMONAS AERUGINOSA Performed at [facility]    Report Status 03/18/2015 FINAL  Final   Organism ID, Bacteria PSEUDOMONAS AERUGINOSA  Final      Susceptibility   Pseudomonas aeruginosa - MIC*    CEFTAZIDIME 4 SENSITIVE Sensitive     CIPROFLOXACIN <=0.25 SENSITIVE Sensitive     GENTAMICIN 8 INTERMEDIATE Intermediate     IMIPENEM 2 SENSITIVE Sensitive     PIP/TAZO 16 SENSITIVE Sensitive     CEFEPIME 4 SENSITIVE Sensitive     * >=100,000 COLONIES/mL PSEUDOMONAS AERUGINOSA    [x]  Treated with cephalexin, organism resistant to prescribed antimicrobial  New antibiotic prescription: ciprofloxacin 500mg  BID x7 days  Follow up with patient's SNF facility in regard to initiation of ciprofloxacin while patient is on  warfarin. Expect to see changes in INR and manage accordingly  ED Provider: Delsa Grana, PA-C   Judieth Keens, PharmD 03/19/2015, 2:28 PM PGY1 Resident Phone# [phone]

## 2015-03-19 NOTE — Telephone Encounter (Signed)
Post ED Visit - Positive Culture Follow-up: Successful Patient Follow-Up  Culture assessed and recommendations reviewed by: []  Elenor Quinones, Pharm.D. []  Heide Guile, Pharm.D., BCPS []  Parks Neptune, Pharm.D. []  Alycia Rossetti, Pharm.D., BCPS []  [place], [place].D., BCPS, AAHIVP []  Legrand Como, Pharm.D., BCPS, AAHIVP []  Milus Glazier, Pharm.D. [x]  Stephens November, [place].D.  Positive Urine culture, >/= 100,000 colonies -> Pseudomonas aeruginosa  []  Patient discharged without antimicrobial prescription and treatment is now indicated [x]  Organism is resistant to prescribed ED discharge antimicrobial Cephalexin []  Patient with positive blood cultures  Changes discussed with ED provider: Verline Lema PA-C New antibiotic prescription "Cipro 500 mg BID x 7 days"  Cx report faxed to [place] [phone] attn Herbert Spires.  Pt seen by [facility] and placed on Cipro 03/19/15.     Dortha Kern 03/19/2015, 5:43 PM

## 2015-03-19 NOTE — Progress Notes (Signed)
Patient ID: Jay Guzman, male   DOB: [birth year], 81 y.o.   MRN: [id number]    Nursing Home Location:  [facility] Point of Service: SNF (31)  PCP: Odette Fraction, MD  Allergies  Allergen Reactions  . Codeine Other (See Comments)    Reaction during surgical  Procedure-unknown  . Morphine And Related Other (See Comments)    Reaction unknown-during surgical procedure    Chief Complaint  Patient presents with  . Acute Visit    HPI:  Patient is a 81 y.o. male seen today at [facility] at the request of nursing.  Patient recently had a fall on 03/15/15 and was sent to the ED. Xrays were negative for any fracture, and CT was negative for bleed.  He was found to have a UTI and started on Cephalexin.  Culture is back now and shows UTI is from Pseudomonas.  The patients son reports that he has been more short of breath. Nursing reports that he is more somnolent. No shortness of breath on exam. Pt also has been complaining of back pain. Lumbar images not obtained in ED therefore obtained in facility and were negative for acute findings.  Today he has been up to his wheelchair and ate lunch, however nursing reports that he was very sleepy.    Review of Systems: Review of SystemsUnable to obtain due to dementia and lethargy    Past Medical History  Diagnosis Date  . Coronary disease     WITH REMOTE ANTERIOR MYOCARDIAL INFARCTION  . [place] thrombus of heart (HCC)     CHRONIC  . Hypertension   . Hyperlipidemia   . CHF (congestive heart failure) ([place])   . Dementia   . A-fib ([place])   . COPD (chronic obstructive pulmonary disease) ([place])   . Shortness of breath dyspnea    Past Surgical History  Procedure Laterality Date  . Cardiac catheterization  2000  . Hernia repair      STATUS POST BILATERAL  . Abdominal aortic aneurysm repair  2008   Social History:   reports that he has quit smoking. His smoking use included Cigarettes. He has a 60  pack-year smoking history. He does not have any smokeless tobacco history on file. He reports that he does not drink alcohol or use illicit drugs.  Family History  Problem Relation Age of Onset  . Cerebral aneurysm Father   . Cancer Sister   . Cancer Mother   . Heart attack Mother   . Heart attack Father   . Stroke Paternal Grandmother   . Stroke Paternal Grandfather   . Hypertension Mother   . Hypertension Father   . Hypertension Sister   . Hypertension Brother     Medications: Patient's Medications  New Prescriptions   No medications on file  Previous Medications   ALBUTEROL (PROVENTIL) (2.5 MG/3ML) 0.083% NEBULIZER SOLUTION    Take 2.5 mg by nebulization every 4 (four) hours as needed (and three times daily scheduled).    ASENAPINE MALEATE (SAPHRIS) 10 MG SUBL    Place 10 mg under the tongue 2 (two) times daily.   BISACODYL (DULCOLAX) 10 MG SUPPOSITORY    Place 10 mg rectally as needed for moderate constipation.   CEPHALEXIN (KEFLEX) 500 MG CAPSULE    Take 1 capsule (500 mg total) by mouth 3 (three) times daily.   DICLOFENAC SODIUM (VOLTAREN) 1 % GEL    Apply 2 g topically 2 (two) times daily.  DIGOXIN (LANOXIN) 0.25 MG TABLET    Take 1 tablet (0.25 mg total) by mouth daily.   DONEPEZIL (ARICEPT) 5 MG TABLET    Take 5 mg by mouth at bedtime.    DOXAZOSIN (CARDURA) 2 MG TABLET    Take 2 mg by mouth daily.    EZETIMIBE-SIMVASTATIN (VYTORIN) 10-40 MG TABLET    Take 1 tablet by mouth at bedtime.    FUROSEMIDE (LASIX) 40 MG TABLET    Take 1.5 tablets (60 mg total) by mouth daily. Take one tablet by mouth once daily for CHF   GUAIFENESIN (MUCINEX) 600 MG 12 HR TABLET    Take 2 tablets (1,200 mg total) by mouth 2 (two) times daily.   MAGNESIUM HYDROXIDE (MILK OF MAGNESIA) 400 MG/5ML SUSPENSION    Take 30 mLs by mouth daily as needed for mild constipation.   METOPROLOL SUCCINATE (TOPROL-XL) 50 MG 24 HR TABLET    Take 50 mg by mouth daily.    MIRTAZAPINE (REMERON) 30 MG TABLET    Take  30 mg by mouth at bedtime.    NICOTINE (NICODERM CQ - DOSED IN MG/24 HOURS) 21 MG/24HR PATCH    Place 21 mg onto the skin daily.   NITROGLYCERIN (NITROSTAT) 0.4 MG SL TABLET    Place 0.4 mg under the tongue every 5 (five) minutes as needed for chest pain.    ONDANSETRON (ZOFRAN) 4 MG TABLET    Take 4 mg by mouth every 8 (eight) hours as needed for nausea or vomiting.   OXYGEN    Inhale 2 L into the lungs continuous.   SODIUM PHOSPHATES (RA SALINE ENEMA) 19-7 GM/118ML ENEM    Place 1 each rectally as needed (for constipation).   STIOLTO RESPIMAT 2.5-2.5 MCG/ACT AERS    INHALE 2 PUFFS INTO THE LUNGS DAILY.   WARFARIN (COUMADIN) 1 MG TABLET    Take 1 mg by mouth daily. Take along with 6mg  to equal a total of 7mg  per day   WARFARIN (COUMADIN) 6 MG TABLET    Take 6 mg by mouth daily. Take with 1 mg for a total of 7 mg daily  Modified Medications   No medications on file  Discontinued Medications   No medications on file     Physical Exam: Filed Vitals:   03/19/15 1402  BP: 121/61  Pulse: 96  Temp: 98.6 F (37 C)  Resp: 20  SpO2: 94%    Physical Exam  Constitutional: He appears lethargic. No distress.  Frail elderly male.   HENT:  Head: Normocephalic.  Eyes: Pupils are equal, round, and reactive to light.  Neck: Normal range of motion. Neck supple. No JVD present.  Cardiovascular: Normal rate, regular rhythm and normal heart sounds.   No murmur heard. Pulmonary/Chest: Effort normal. He has rhonchi in the right upper field, the right middle field, the left upper field and the left middle field.  Abdominal: Soft. Bowel sounds are normal. He exhibits no distension. There is no tenderness.  Musculoskeletal: Normal range of motion. He exhibits no edema or tenderness.  Lymphadenopathy:    He has no cervical adenopathy.  Neurological: He appears lethargic.  Skin: He is not diaphoretic.    Labs reviewed: Basic Metabolic Panel:  Recent Labs  01/26/15 0547 01/29/15 0308   02/23/15 0300 02/24/15 0419 03/04/15 03/11/15 1302  NA 144 139  < > 144 141 142 141  K 4.5 4.3  < > 4.4 4.4 4.0 4.0  CL 103 93*  < > 104 104  --  103  CO2 35* 39*  < > 30 31  --  30  GLUCOSE 112* 105*  < > 153* 158*  --  94  BUN 28* 38*  < > 32* 39* 29* 25  CREATININE 1.24 1.38*  < > 1.11 1.20 0.9 1.14*  CALCIUM 8.4* 8.6*  < > 8.6* 8.4*  --  8.5*  MG 2.3 2.3  --   --  2.2  --   --   < > = values in this interval not displayed. Liver Function Tests:  Recent Labs  12/25/14 1405 01/10/15 1014 02/22/15 1037  AST 21 33 22  ALT 17 49* 19  ALKPHOS 49 47 49  BILITOT 1.7* 1.2 1.9*  PROT 5.7* 4.9* 5.3*  ALBUMIN 3.5 3.3* 3.2*   No results for input(s): LIPASE, AMYLASE in the last 8760 hours.  Recent Labs  02/22/15 1038  AMMONIA 18   CBC:  Recent Labs  12/25/14 1405 01/10/15 1014  02/22/15 0702 02/24/15 0419 02/25/15 0411 03/04/15  WBC 8.7 7.6  < > 9.3 9.7 11.1* 8.0  NEUTROABS 6.7 5.7  --  6.7  --   --   --   HGB 17.1* 16.1  < > 15.9 14.4 14.1 14.1  HCT 51.2 49.5  < > 48.5 44.7 43.8 44  MCV 101.2* 100.6*  < > 102.1* 101.6* 101.6*  --   PLT 119* 121*  < > 111* 89* 87* 106*  < > = values in this interval not displayed. TSH: No results for input(s): TSH in the last 8760 hours. A1C: No results found for: HGBA1C Lipid Panel: No results for input(s): CHOL, HDL, LDLCALC, TRIG, CHOLHDL, LDLDIRECT in the last 8760 hours.   Dg Thoracic Spine W/swimmers  03/15/2015  CLINICAL DATA:  81 year old male with a history of fall. Thoracic pain EXAM: THORACIC SPINE - 3 VIEWS COMPARISON:  Chest x-ray 02/23/2015, 01/23/2015 FINDINGS: Thoracic Spine: Thoracic vertebral elements maintain normal anatomic alignment, with no evidence of anterolisthesis, retrolisthesis, or subluxation. No acute fracture line identified. Vertebral body heights maintained. Disc space narrowing with endplate changes throughout the thoracic spine. Calcifications of the thoracic aorta. Chronic changes of the lungs  again visualized. IMPRESSION: Negative for acute fracture or malalignment of the thoracic spine. Atherosclerosis Signed, Dulcy Fanny. Earleen Newport, DO [facility] Electronically Signed   By: Corrie Mckusick D.O.   On: 03/15/2015 19:07   Dg Elbow Complete Right  03/15/2015  CLINICAL DATA:  Fall today, pain right elbow and upper back. EXAM: RIGHT ELBOW - COMPLETE 3+ VIEW COMPARISON:  None. FINDINGS: Osseous alignment is normal. No fracture line or displaced fracture fragment seen. Degenerative spurring is present along the posterior margin of the olecranon and at the lateral femoral condyle. Lateral view positioning is suboptimal but there is no obvious evidence of joint effusion. Superficial soft tissues about the right elbow are unremarkable. IMPRESSION: Chronic/degenerative findings described above. No acute findings.  No fracture or dislocation. Electronically Signed   By: Franki Cabot M.D.   On: 03/15/2015 19:07   Ct Head Wo Contrast  03/15/2015  CLINICAL DATA:  Fall with head injury in a patient on Coumadin therapy. Patient complains of neck pain. EXAM: CT HEAD WITHOUT CONTRAST CT CERVICAL SPINE WITHOUT CONTRAST TECHNIQUE: Multidetector CT imaging of the head and cervical spine was performed following the standard protocol without intravenous contrast. Multiplanar CT image reconstructions of the cervical spine were also generated. COMPARISON:  03/05/2015 FINDINGS: CT HEAD FINDINGS No mass effect or  midline shift. No evidence of acute intracranial hemorrhage, or infarction. No abnormal extra-axial fluid collections. There is moderate brain parenchymal atrophy and chronic small vessel disease changes. Vascular calcifications are noted at the skullbase. Basal cisterns are preserved. No depressed skull fractures. Visualized paranasal sinuses and mastoid air cells are not opacified. CT CERVICAL SPINE FINDINGS There is straightening of the cervical lordosis,  which may be positional. There is no evidence for acute fracture or dislocation. Moderate to severe osteoarthritic changes are seen at all levels of the cervical spine, worse at C3-C4 and C5-C6, with disc space narrowing, discogenic endplate sclerosis and anterior and posterior osteophytes. Prevertebral soft tissues have a normal appearance. Lung apices have a normal appearance. Carotid artery calcifications are seen. IMPRESSION: No evidence of acute traumatic injury to the head. Moderate brain parenchymal atrophy and changes of chronic microvascular disease. No evidence of acute traumatic injury to the cervical spine. Multilevel osteoarthritic changes of the cervical spine. Calcified atherosclerotic disease. Electronically Signed   By: Fidela Salisbury M.D.   On: 03/15/2015 18:39   Ct Cervical Spine Wo Contrast  03/15/2015  CLINICAL DATA:  Fall with head injury in a patient on Coumadin therapy. Patient complains of neck pain. EXAM: CT HEAD WITHOUT CONTRAST CT CERVICAL SPINE WITHOUT CONTRAST TECHNIQUE: Multidetector CT imaging of the head and cervical spine was performed following the standard protocol without intravenous contrast. Multiplanar CT image reconstructions of the cervical spine were also generated. COMPARISON:  03/05/2015 FINDINGS: CT HEAD FINDINGS No mass effect or midline shift. No evidence of acute intracranial hemorrhage, or infarction. No abnormal extra-axial fluid collections. There is moderate brain parenchymal atrophy and chronic small vessel disease changes. Vascular calcifications are noted at the skullbase. Basal cisterns are preserved. No depressed skull fractures. Visualized paranasal sinuses and mastoid air cells are not opacified. CT CERVICAL SPINE FINDINGS There is straightening of the cervical lordosis, which may be positional. There is no evidence for acute fracture or dislocation. Moderate to severe osteoarthritic changes are seen at all levels of the cervical spine, worse at  C3-C4 and C5-C6, with disc space narrowing, discogenic endplate sclerosis and anterior and posterior osteophytes. Prevertebral soft tissues have a normal appearance. Lung apices have a normal appearance. Carotid artery calcifications are seen. IMPRESSION: No evidence of acute traumatic injury to the head. Moderate brain parenchymal atrophy and changes of chronic microvascular disease. No evidence of acute traumatic injury to the cervical spine. Multilevel osteoarthritic changes of the cervical spine. Calcified atherosclerotic disease. Electronically Signed   By: Fidela Salisbury M.D.   On: 03/15/2015 18:39    Assessment/Plan 1. UTI (lower urinary tract infection) Antibiotic changed per sensitivity report to Cipro 250mg  po bid for 7 weeks.  To DC keflex  2. Lethargy Most likely due to patient's UTI. Expect that this will improve when UTI is treated.  Patient is arousable and still getting up to the wheelchair.  -will follow up CBC and BMP as well.   3. Abnormal lung sounds -pt with hx of COPD however due to increased lethargy, Will obtain chest x ray.   4. Back pain -increased pain to back after fall, lumbar spine xray done by facility without acute findings. Will schedule tylenol 650 mg q 6 hours for better pain control.      Carlos American. Harle Battiest  [facility] [phone] 8 am - 5 pm) [phone] (after hours)

## 2015-03-25 ENCOUNTER — Encounter (HOSPITAL_COMMUNITY): Payer: Self-pay

## 2015-03-25 ENCOUNTER — Emergency Department (HOSPITAL_COMMUNITY)
Admission: EM | Admit: 2015-03-25 | Discharge: 2015-03-25 | Disposition: A | Discharge status: Home / Self Care | Payer: Medicare Other | Attending: Emergency Medicine | Admitting: Emergency Medicine

## 2015-03-25 ENCOUNTER — Emergency Department (HOSPITAL_COMMUNITY): Payer: Medicare Other

## 2015-03-25 ENCOUNTER — Non-Acute Institutional Stay (SKILLED_NURSING_FACILITY): Payer: Medicare Other | Admitting: Internal Medicine

## 2015-03-25 DIAGNOSIS — Z7901 Long term (current) use of anticoagulants: Secondary | ICD-10-CM | POA: Insufficient documentation

## 2015-03-25 DIAGNOSIS — R400 Somnolence: Secondary | ICD-10-CM | POA: Diagnosis not present

## 2015-03-25 DIAGNOSIS — R4182 Altered mental status, unspecified: Secondary | ICD-10-CM | POA: Diagnosis not present

## 2015-03-25 DIAGNOSIS — F0390 Unspecified dementia without behavioral disturbance: Secondary | ICD-10-CM | POA: Insufficient documentation

## 2015-03-25 DIAGNOSIS — Z8639 Personal history of other endocrine, nutritional and metabolic disease: Secondary | ICD-10-CM | POA: Insufficient documentation

## 2015-03-25 DIAGNOSIS — I2510 Atherosclerotic heart disease of native coronary artery without angina pectoris: Secondary | ICD-10-CM | POA: Insufficient documentation

## 2015-03-25 DIAGNOSIS — Y9289 Other specified places as the place of occurrence of the external cause: Secondary | ICD-10-CM | POA: Diagnosis not present

## 2015-03-25 DIAGNOSIS — Y998 Other external cause status: Secondary | ICD-10-CM | POA: Insufficient documentation

## 2015-03-25 DIAGNOSIS — I481 Persistent atrial fibrillation: Secondary | ICD-10-CM | POA: Diagnosis not present

## 2015-03-25 DIAGNOSIS — M545 Low back pain, unspecified: Secondary | ICD-10-CM

## 2015-03-25 DIAGNOSIS — I10 Essential (primary) hypertension: Secondary | ICD-10-CM | POA: Insufficient documentation

## 2015-03-25 DIAGNOSIS — S32018A Other fracture of first lumbar vertebra, initial encounter for closed fracture: Secondary | ICD-10-CM

## 2015-03-25 DIAGNOSIS — Z791 Long term (current) use of non-steroidal anti-inflammatories (NSAID): Secondary | ICD-10-CM | POA: Insufficient documentation

## 2015-03-25 DIAGNOSIS — Z79899 Other long term (current) drug therapy: Secondary | ICD-10-CM | POA: Diagnosis not present

## 2015-03-25 DIAGNOSIS — N3000 Acute cystitis without hematuria: Secondary | ICD-10-CM

## 2015-03-25 DIAGNOSIS — I5042 Chronic combined systolic (congestive) and diastolic (congestive) heart failure: Secondary | ICD-10-CM | POA: Diagnosis not present

## 2015-03-25 DIAGNOSIS — I509 Heart failure, unspecified: Secondary | ICD-10-CM | POA: Diagnosis not present

## 2015-03-25 DIAGNOSIS — I4819 Other persistent atrial fibrillation: Secondary | ICD-10-CM

## 2015-03-25 DIAGNOSIS — W1839XA Other fall on same level, initial encounter: Secondary | ICD-10-CM | POA: Diagnosis not present

## 2015-03-25 DIAGNOSIS — K5900 Constipation, unspecified: Secondary | ICD-10-CM | POA: Diagnosis not present

## 2015-03-25 DIAGNOSIS — J449 Chronic obstructive pulmonary disease, unspecified: Secondary | ICD-10-CM | POA: Insufficient documentation

## 2015-03-25 DIAGNOSIS — Z87891 Personal history of nicotine dependence: Secondary | ICD-10-CM | POA: Diagnosis not present

## 2015-03-25 DIAGNOSIS — Y9389 Activity, other specified: Secondary | ICD-10-CM | POA: Insufficient documentation

## 2015-03-25 DIAGNOSIS — S3992XA Unspecified injury of lower back, initial encounter: Secondary | ICD-10-CM | POA: Diagnosis present

## 2015-03-25 DIAGNOSIS — S32019A Unspecified fracture of first lumbar vertebra, initial encounter for closed fracture: Secondary | ICD-10-CM | POA: Insufficient documentation

## 2015-03-25 LAB — BLOOD GAS, VENOUS
Acid-Base Excess: 1 mmol/L (ref 0.0–2.0)
BICARBONATE: 27.6 meq/L — AB (ref 20.0–24.0)
DRAWN BY: 295031
O2 Content: 4 L/min
O2 Saturation: 60 %
PCO2 VEN: 55.5 mmHg — AB (ref 45.0–50.0)
PH VEN: 7.317 — AB (ref 7.250–7.300)
PO2 VEN: 36 mmHg (ref 30.0–45.0)
Patient temperature: 98.6
TCO2: 25.4 mmol/L (ref 0–100)

## 2015-03-25 LAB — I-STAT CHEM 8, ED
BUN: 40 mg/dL — ABNORMAL HIGH (ref 6–20)
CALCIUM ION: 1.11 mmol/L — AB (ref 1.13–1.30)
CHLORIDE: 101 mmol/L (ref 101–111)
Creatinine, Ser: 1.2 mg/dL (ref 0.61–1.24)
GLUCOSE: 92 mg/dL (ref 65–99)
HCT: 48 % (ref 39.0–52.0)
Hemoglobin: 16.3 g/dL (ref 13.0–17.0)
Potassium: 3.8 mmol/L (ref 3.5–5.1)
Sodium: 146 mmol/L — ABNORMAL HIGH (ref 135–145)
TCO2: 33 mmol/L (ref 0–100)

## 2015-03-25 LAB — URINALYSIS, ROUTINE W REFLEX MICROSCOPIC
GLUCOSE, UA: NEGATIVE mg/dL
HGB URINE DIPSTICK: NEGATIVE
KETONES UR: NEGATIVE mg/dL
Nitrite: NEGATIVE
PH: 5 (ref 5.0–8.0)
PROTEIN: NEGATIVE mg/dL
Specific Gravity, Urine: 1.026 (ref 1.005–1.030)
Urobilinogen, UA: 1 mg/dL (ref 0.0–1.0)

## 2015-03-25 LAB — COMPREHENSIVE METABOLIC PANEL
ALK PHOS: 62 U/L (ref 38–126)
ALT: 20 U/L (ref 17–63)
ANION GAP: 7 (ref 5–15)
AST: 22 U/L (ref 15–41)
Albumin: 3.4 g/dL — ABNORMAL LOW (ref 3.5–5.0)
BILIRUBIN TOTAL: 1.8 mg/dL — AB (ref 0.3–1.2)
BUN: 42 mg/dL — ABNORMAL HIGH (ref 6–20)
CALCIUM: 8.5 mg/dL — AB (ref 8.9–10.3)
CO2: 34 mmol/L — AB (ref 22–32)
Chloride: 105 mmol/L (ref 101–111)
Creatinine, Ser: 1.29 mg/dL — ABNORMAL HIGH (ref 0.61–1.24)
GFR, EST AFRICAN AMERICAN: 59 mL/min — AB (ref >60)
GFR, EST NON AFRICAN AMERICAN: 51 mL/min — AB (ref >60)
Glucose, Bld: 58 mg/dL — ABNORMAL LOW (ref 65–99)
Potassium: 3.9 mmol/L (ref 3.5–5.1)
SODIUM: 146 mmol/L — AB (ref 135–145)
TOTAL PROTEIN: 5.7 g/dL — AB (ref 6.5–8.1)

## 2015-03-25 LAB — CBC WITH DIFFERENTIAL/PLATELET
BASOS ABS: 0 10*3/uL (ref 0.0–0.1)
BASOS PCT: 0 %
Eosinophils Absolute: 0.3 10*3/uL (ref 0.0–0.7)
Eosinophils Relative: 3 %
HEMATOCRIT: 48.1 % (ref 39.0–52.0)
HEMOGLOBIN: 15.1 g/dL (ref 13.0–17.0)
Lymphocytes Relative: 17 %
Lymphs Abs: 1.2 10*3/uL (ref 0.7–4.0)
MCH: 33 pg (ref 26.0–34.0)
MCHC: 31.4 g/dL (ref 30.0–36.0)
MCV: 105 fL — ABNORMAL HIGH (ref 78.0–100.0)
Monocytes Absolute: 0.7 10*3/uL (ref 0.1–1.0)
Monocytes Relative: 10 %
NEUTROS ABS: 5.2 10*3/uL (ref 1.7–7.7)
NEUTROS PCT: 70 %
Platelets: 161 10*3/uL (ref 150–400)
RBC: 4.58 MIL/uL (ref 4.22–5.81)
RDW: 15.4 % (ref 11.5–15.5)
WBC: 7.5 10*3/uL (ref 4.0–10.5)

## 2015-03-25 LAB — URINE MICROSCOPIC-ADD ON

## 2015-03-25 LAB — CBG MONITORING, ED: GLUCOSE-CAPILLARY: 83 mg/dL (ref 65–99)

## 2015-03-25 LAB — AMMONIA: AMMONIA: 23 umol/L (ref 9–35)

## 2015-03-25 LAB — PROTIME-INR
INR: 1.72 — AB (ref 0.00–1.49)
Prothrombin Time: 20.2 seconds — ABNORMAL HIGH (ref 11.6–15.2)

## 2015-03-25 LAB — I-STAT CG4 LACTIC ACID, ED: LACTIC ACID, VENOUS: 1.03 mmol/L (ref 0.5–2.0)

## 2015-03-25 LAB — ETHANOL

## 2015-03-25 MED ORDER — OXYCODONE HCL 5 MG PO TABS: 2.5000 mg | ORAL_TABLET | ORAL | Status: DC | PRN

## 2015-03-25 MED ORDER — ACETAMINOPHEN 500 MG PO TABS
1000.0000 mg | ORAL_TABLET | Freq: Once | ORAL | Status: AC
  Administered 2015-03-25: 1000 mg via ORAL
  Filled 2015-03-25: qty 2

## 2015-03-25 MED ORDER — DOCUSATE SODIUM 100 MG PO CAPS: 100.0000 mg | ORAL_CAPSULE | Freq: Two times a day (BID) | ORAL | Status: AC

## 2015-03-25 MED ORDER — IOHEXOL 300 MG/ML  SOLN
80.0000 mL | Freq: Once | INTRAMUSCULAR | Status: AC | PRN
  Administered 2015-03-25: 80 mL via INTRAVENOUS

## 2015-03-25 MED ORDER — SODIUM CHLORIDE 0.9 % IV BOLUS (SEPSIS)
500.0000 mL | Freq: Once | INTRAVENOUS | Status: AC
  Administered 2015-03-25: 500 mL via INTRAVENOUS

## 2015-03-25 MED ORDER — HYDROMORPHONE HCL 1 MG/ML IJ SOLN
0.2500 mg | Freq: Once | INTRAMUSCULAR | Status: AC
  Administered 2015-03-25: 0.25 mg via INTRAVENOUS
  Filled 2015-03-25: qty 1

## 2015-03-25 MED ORDER — IOHEXOL 300 MG/ML  SOLN
50.0000 mL | Freq: Once | INTRAMUSCULAR | Status: AC | PRN
  Administered 2015-03-25: 50 mL via ORAL

## 2015-03-25 NOTE — ED Notes (Signed)
Report given to RN at [facility]. Acknowledges understanding of discharge information and of TLSO use.

## 2015-03-25 NOTE — ED Provider Notes (Signed)
CSN: [id number]     Arrival date & time 03/25/15  6295 History   First MD Initiated Contact with Patient 03/25/15 [phone]     Chief Complaint  Patient presents with  . Altered Mental Status     (Consider location/radiation/quality/duration/timing/severity/associated sxs/prior Treatment) Patient is a 81 y.o. male presenting with altered mental status. The history is provided by the patient and a relative. The history is limited by the condition of the patient.  Altered Mental Status Presenting symptoms: disorientation and partial responsiveness   Presenting symptoms: no confusion   Severity:  Moderate Most recent episode:  Today Episode history:  Single Duration:  2 days Timing:  Constant Progression:  Worsening Chronicity:  New Context: dementia   Associated symptoms: no abdominal pain, no fever, no headaches, no palpitations, no rash and no vomiting    81 yo M with a chief complaint of increased sleepiness. This started yesterday and is on on all day. Patient arousable to voice. Some thinks this may be due to a fall that happened on Sunday. Noted witnessed or reported fall patient was complaining of back pain for about a week. Patient having a cough family unsure of fevers or chills.  Past Medical History  Diagnosis Date  . Coronary disease     WITH REMOTE ANTERIOR MYOCARDIAL INFARCTION  . [place] thrombus of heart (HCC)     CHRONIC  . Hypertension   . Hyperlipidemia   . CHF (congestive heart failure) ([place])   . Dementia   . A-fib ([place])   . COPD (chronic obstructive pulmonary disease) ([place])   . Shortness of breath dyspnea    Past Surgical History  Procedure Laterality Date  . Cardiac catheterization  2000  . Hernia repair      STATUS POST BILATERAL  . Abdominal aortic aneurysm repair  2008   Family History  Problem Relation Age of Onset  . Cerebral aneurysm Father   . Cancer Sister   . Cancer Mother   . Heart attack Mother   . Heart attack Father   . Stroke Paternal  Grandmother   . Stroke Paternal Grandfather   . Hypertension Mother   . Hypertension Father   . Hypertension Sister   . Hypertension Brother    Social History  Substance Use Topics  . Smoking status: Former Smoker -- 1.00 packs/day for 60 years    Types: Cigarettes  . Smokeless tobacco: None  . Alcohol Use: No    Review of Systems  Constitutional: Negative for fever and chills.  HENT: Negative for congestion and facial swelling.   Eyes: Negative for discharge and visual disturbance.  Respiratory: Negative for shortness of breath.   Cardiovascular: Negative for chest pain and palpitations.  Gastrointestinal: Negative for vomiting, abdominal pain and diarrhea.  Musculoskeletal: Negative for myalgias and arthralgias.  Skin: Negative for color change and rash.  Neurological: Negative for tremors, syncope and headaches.  Psychiatric/Behavioral: Negative for confusion and dysphoric mood.      Allergies  Codeine and Morphine and related  Home Medications   Prior to Admission medications   Medication Sig Start Date End Date Taking? Authorizing Provider  acetaminophen (TYLENOL) 325 MG tablet Take 650 mg by mouth every 6 (six) hours as needed (for pain).   Yes Historical Provider, MD  albuterol (PROVENTIL) (2.5 MG/3ML) 0.083% nebulizer solution Take 2.5 mg by nebulization every 4 (four) hours as needed for shortness of breath.    Yes Historical Provider, MD  albuterol (PROVENTIL) (2.5 MG/3ML) 0.083% nebulizer solution  Take 2.5 mg by nebulization 3 (three) times daily.   Yes Historical Provider, MD  Asenapine Maleate (SAPHRIS) 10 MG SUBL Place 10 mg under the tongue 2 (two) times daily.   Yes Historical Provider, MD  bisacodyl (DULCOLAX) 10 MG suppository Place 10 mg rectally as needed for moderate constipation.   Yes Historical Provider, MD  ciprofloxacin (CIPRO) 500 MG tablet Take 500 mg by mouth 2 (two) times daily. Started 11/03 for 7 days   Yes Historical Provider, MD  diclofenac  sodium (VOLTAREN) 1 % GEL Apply 4 g topically 2 (two) times daily.    Yes Historical Provider, MD  digoxin (LANOXIN) 0.25 MG tablet Take 1 tablet (0.25 mg total) by mouth daily. 01/30/15  Yes Orson Eva, MD  donepezil (ARICEPT) 5 MG tablet Take 5 mg by mouth at bedtime.    Yes Historical Provider, MD  doxazosin (CARDURA) 2 MG tablet Take 2 mg by mouth daily.  02/27/15  Yes Mahima Pandey, MD  ezetimibe-simvastatin (VYTORIN) 10-40 MG tablet Take 1 tablet by mouth at bedtime.  01/10/15  Yes Peter M Martinique, MD  furosemide (LASIX) 20 MG tablet Take 60 mg by mouth daily.   Yes Historical Provider, MD  guaiFENesin (MUCINEX) 600 MG 12 hr tablet Take 2 tablets (1,200 mg total) by mouth 2 (two) times daily. 02/25/15  Yes Bonnielee Haff, MD  magnesium hydroxide (MILK OF MAGNESIA) 400 MG/5ML suspension Take 30 mLs by mouth daily as needed for mild constipation.   Yes Historical Provider, MD  metoprolol succinate (TOPROL-XL) 50 MG 24 hr tablet Take 50 mg by mouth daily.  02/27/15  Yes Mahima Pandey, MD  mirtazapine (REMERON) 30 MG tablet Take 30 mg by mouth at bedtime.    Yes Historical Provider, MD  nicotine (NICODERM CQ - DOSED IN MG/24 HOURS) 21 mg/24hr patch Place 21 mg onto the skin daily.   Yes Historical Provider, MD  nitroGLYCERIN (NITROSTAT) 0.4 MG SL tablet Place 0.4 mg under the tongue every 5 (five) minutes as needed for chest pain.    Yes Historical Provider, MD  ondansetron (ZOFRAN) 4 MG tablet Take 4 mg by mouth every 8 (eight) hours as needed for nausea or vomiting.   Yes Historical Provider, MD  OXYGEN Inhale 2 L into the lungs continuous.   Yes Historical Provider, MD  saccharomyces boulardii (FLORASTOR) 250 MG capsule Take 250 mg by mouth 2 (two) times daily. Started 11/02 for 10 days   Yes Historical Provider, MD  Sodium Phosphates (RA SALINE ENEMA) 19-7 GM/118ML ENEM Place 1 each rectally as needed (for constipation).   Yes Historical Provider, MD  STIOLTO RESPIMAT 2.5-2.5 MCG/ACT AERS INHALE 2  PUFFS INTO THE LUNGS DAILY. 01/07/15  Yes Historical Provider, MD  warfarin (COUMADIN) 2 MG tablet Take 2 mg by mouth daily. Take with 6 mg for a total of 8 mg daily   Yes Historical Provider, MD  warfarin (COUMADIN) 6 MG tablet Take 6 mg by mouth daily. Take with 2 mg for a total of 8 mg daily   Yes Historical Provider, MD  zinc oxide (BALMEX) 11.3 % CREA cream Apply 1 application topically 2 (two) times daily. Apply to blanchable redness on buttocks after bathing   Yes Historical Provider, MD  docusate sodium (COLACE) 100 MG capsule Take 1 capsule (100 mg total) by mouth every 12 (twelve) hours. 03/25/15   Deno Etienne, DO  furosemide (LASIX) 40 MG tablet Take 1.5 tablets (60 mg total) by mouth daily. Take one tablet by mouth  once daily for CHF Patient not taking: Reported on 03/25/2015 03/14/15   Burtis Junes, NP  oxyCODONE (ROXICODONE) 5 MG immediate release tablet Take 0.5 tablets (2.5 mg total) by mouth every 4 (four) hours as needed for severe pain. 03/25/15   Deno Etienne, DO   BP 108/65 mmHg  Pulse 60  Temp(Src) 97.5 F (36.4 C) (Oral)  Resp 18  SpO2 96% Physical Exam  Constitutional: He is oriented to person, place, and time. He appears well-developed and well-nourished. He appears lethargic.  HENT:  Head: Normocephalic and atraumatic.  Eyes: EOM are normal. Pupils are equal, round, and reactive to light.  Neck: Normal range of motion. Neck supple. No JVD present.  Cardiovascular: Normal rate and regular rhythm.  Exam reveals no gallop and no friction rub.   No murmur heard. Pulmonary/Chest: No respiratory distress. He has no wheezes.  Abdominal: He exhibits no distension. There is tenderness. There is no rebound and no guarding.    Musculoskeletal: Normal range of motion.       Arms: Neurological: He is oriented to person, place, and time. He appears lethargic. GCS eye subscore is 3. GCS verbal subscore is 5. GCS motor subscore is 6. He displays no Babinski's sign on the right side.  He displays no Babinski's sign on the left side.  Reflex Scores:      Tricep reflexes are 2+ on the right side and 2+ on the left side.      Bicep reflexes are 2+ on the right side and 2+ on the left side.      Brachioradialis reflexes are 2+ on the right side and 2+ on the left side.      Patellar reflexes are 2+ on the right side and 2+ on the left side.      Achilles reflexes are 2+ on the right side and 2+ on the left side. Equal strength. Patient has mild left-sided facial droop the family feels is chronic. Patient also has a deviation outward of the right eye but family is not sure that they've noticed before but says that he has a history of a lazy eye  Skin: No rash noted. No pallor.  Psychiatric: He has a normal mood and affect. His behavior is normal.  Nursing note and vitals reviewed.   ED Course  Procedures (including critical care time) Labs Review Labs Reviewed  COMPREHENSIVE METABOLIC PANEL - Abnormal; Notable for the following:    Sodium 146 (*)    CO2 34 (*)    Glucose, Bld 58 (*)    BUN 42 (*)    Creatinine, Ser 1.29 (*)    Calcium 8.5 (*)    Total Protein 5.7 (*)    Albumin 3.4 (*)    Total Bilirubin 1.8 (*)    GFR calc non Af Amer 51 (*)    GFR calc Af Amer 59 (*)    All other components within normal limits  CBC WITH DIFFERENTIAL/PLATELET - Abnormal; Notable for the following:    MCV 105.0 (*)    All other components within normal limits  URINALYSIS, ROUTINE W REFLEX MICROSCOPIC (NOT AT [facility]) - Abnormal; Notable for the following:    Color, Urine AMBER (*)    Bilirubin Urine SMALL (*)    Leukocytes, UA SMALL (*)    All other components within normal limits  BLOOD GAS, VENOUS - Abnormal; Notable for the following:    pH, Ven 7.317 (*)    pCO2, Ven 55.5 (*)  Bicarbonate 27.6 (*)    All other components within normal limits  PROTIME-INR - Abnormal; Notable for the following:    Prothrombin Time 20.2 (*)    INR 1.72 (*)    All other components within  normal limits  I-STAT CHEM 8, ED - Abnormal; Notable for the following:    Sodium 146 (*)    BUN 40 (*)    Calcium, Ion 1.11 (*)    All other components within normal limits  URINE CULTURE  AMMONIA  ETHANOL  URINE MICROSCOPIC-ADD ON  I-STAT CG4 LACTIC ACID, ED  CBG MONITORING, ED    Imaging Review Dg Chest 2 View  03/25/2015  CLINICAL DATA:  Shortness of breath and cough. Altered mental status EXAM: CHEST  2 VIEW COMPARISON:  February 23, 2015 and August 26, 2014 FINDINGS: There is stable scarring throughout the lungs bilaterally with areas of stable interstitial prominence, stable. There is no edema or consolidation. The heart is borderline enlarged with pulmonary vascularity within normal limits. No adenopathy. No bone lesions. There is atherosclerotic calcification in the aorta. There is also calcification in the left anterior descending coronary artery. IMPRESSION: Stable patchy interstitial thickening and scarring, stable. No frank edema or consolidation. Heart prominent but stable. Atherosclerotic calcification noted. Left anterior descending coronary artery calcification also noted. Electronically Signed   By: Lowella Grip III M.D.   On: 03/25/2015 10:57   Ct Head Wo Contrast  03/25/2015  CLINICAL DATA:  81 year old male with generalized weakness and altered mental status. Initial encounter. EXAM: CT HEAD WITHOUT CONTRAST TECHNIQUE: Contiguous axial images were obtained from the base of the skull through the vertex without intravenous contrast. COMPARISON:  03/15/2015. FINDINGS: Intravenous gas incidentally noted about the skullbase including at the left IJ and right cavernous sinus. Visualized paranasal sinuses and mastoids are clear. No acute osseous abnormality identified. Negative orbit and scalp soft tissues. Stable cerebral volume. No midline shift, mass effect, or evidence of intracranial mass lesion. Extensive Calcified atherosclerosis at the skull base. No acute intracranial  hemorrhage identified. No cortically based acute infarct identified. Patchy cerebral white matter hypodensity appears stable and is moderate for age. No suspicious intracranial vascular hyperdensity. IMPRESSION: 1. No acute intracranial abnormality. Stable noncontrast CT appearance of the brain with mild to moderate white matter changes. 2. Intravenous gas at the skullbase most likely related to recent intravenous access. Electronically Signed   By: Genevie Ann M.D.   On: 03/25/2015 09:42   Ct Lumbar Spine Wo Contrast  03/25/2015  CLINICAL DATA:  Low back pain. The patient has become less responsive. EXAM: CT LUMBAR SPINE WITHOUT CONTRAST TECHNIQUE: Multidetector CT imaging of the lumbar spine was performed without intravenous contrast administration. Multiplanar CT image reconstructions were also generated. COMPARISON:  CT of the abdomen and pelvis 07/31/2008. FINDINGS: In age indeterminate superior endplate fracture is present at L1 with approximately 30% loss of height. The fracture appears to be incompletely healed. There is no retropulsion of bone. Vertebral body heights are otherwise maintained. Grade 1 anterolisthesis at L4-5 measures 5 mm. Soft tissues demonstrate extensive atherosclerotic calcifications. There stranding about the kidneys. A posterior exophytic cysts are again noted at both kidneys. T12-L1: Mild facet hypertrophy is present bilaterally without significant stenosis. L1-2: Mild facet hypertrophy is present bilaterally. There is no significant stenosis. L2-3: A broad-based disc protrusion is asymmetric to the left. Mild facet hypertrophy contributes to mild subarticular narrowing bilaterally. L3-4: Left greater than right subarticular stenosis is secondary to a leftward disc protrusion and moderate  facet hypertrophy. Mild left foraminal narrowing is also present. L4-5: Uncovering number broad-based disc protrusion results in mild central canal stenosis with right greater than left subarticular  narrowing. Moderate right and mild left foraminal stenosis is present. L5-S1: Mild leftward disc bulging is present without significant stenosis. IMPRESSION: 1. Acute/subacute superior endplate fracture at L1 with approximately 30% loss of height but no retropulsion of bone. 2. Mild subarticular narrowing bilaterally at L2-3. 3. Left greater than right subarticular stenosis at L3-4. 4. Mild central canal stenosis with moderate right and mild left foraminal narrowing at L4-5. 5. Leftward disc bulging at L5-S1 without significant stenosis. Electronically Signed   By: San Morelle M.D.   On: 03/25/2015 11:15   Ct Abdomen Pelvis W Contrast  03/25/2015  ADDENDUM REPORT: 03/25/2015 13:09 ADDENDUM: 7.  Layering stones or sludge in the gallbladder. Electronically Signed   By: Nolon Nations M.D.   On: 03/25/2015 13:09  03/25/2015  CLINICAL DATA:  Pt from [facility]. Per son's report to EMS: Pt was fine yesterday am then went into a "sleepy mode" and has been that way since. Responsive to verbal stimuli per ems. Hx UTI in past. EXAM: CT ABDOMEN AND PELVIS WITH CONTRAST TECHNIQUE: Multidetector CT imaging of the abdomen and pelvis was performed using the standard protocol following bolus administration of intravenous contrast. CONTRAST:  84mL OMNIPAQUE IOHEXOL 300 MG/ML SOLN, 50mL OMNIPAQUE IOHEXOL 300 MG/ML SOLN COMPARISON:  07/31/2008 FINDINGS: Lower chest: Heart is enlarged. There is significant coronary artery disease. Again noted is left ventricular aneurysm associated with thrombus. The appearance is stable. There is no pericardial effusion. There is scarring at the lung bases. Upper abdomen: No focal abnormality identified within the liver, spleen, pancreas. There is prominence of the adrenal glands bilaterally, which appears symmetric. No focal adrenal mass identified. There are bilateral renal masses. These are low-attenuation, consistent with numerous cysts. The largest is adjacent to the left  kidney, now measuring 7.2 cm. Previously this cyst measured 5.2 cm. A left lateral hyperdense cyst is 1.9 cm in diameter. No hydronephrosis. Gallbladder contains layering hyperdense debris, sludge, or stones. Gastrointestinal tract: The stomach and small bowel loops are normal in appearance. The appendix is well seen and has a normal appearance. Colonic loops are normal in appearance the contain significant amount of stool. There are rare colonic diverticula. Pelvis: The urinary bladder is normal in appearance. Prostatic calcifications are present. Seminal vesicles are normal in appearance. No free pelvic fluid. Retroperitoneum: There is dense atherosclerosis of the abdominal aorta and its branches. Abdominal wall: Unremarkable. Osseous structures: There is a subacute superior endplate fracture of L1, stable in appearance. No further compression since prior studies. There is 5 mm anterolisthesis of L4 on L5, stable in appearance. No suspicious lytic or blastic lesions are identified. IMPRESSION: 1. Cardiomegaly and significant coronary artery disease. Stable left ventricular aneurysm an intraluminal thrombus. 2. Bilateral renal cysts.  No hydronephrosis. 3. Significant stool burden.  Colonic diverticulosis. 4. Normal appendix. 5. Stable appearance of L1 superior endplate fracture without further compression. 6. Significant atherosclerosis of the abdominal aorta and branches. Electronically Signed: By: Nolon Nations M.D. On: 03/25/2015 12:43   I have personally reviewed and evaluated these images and lab results as part of my medical decision-making.   EKG Interpretation   Date/Time:  Tuesday March 25 2015 09:21:53 EST Ventricular Rate:  83 PR Interval:    QRS Duration: 96 QT Interval:  390 QTC Calculation: 458 R Axis:   121 Text Interpretation:  Atrial fibrillation Low voltage, extremity leads  Probable anterolateral infarct, old No significant change since last  tracing Confirmed by Zaquan Duffner MD,  DANIEL [phone]) on 03/25/2015 9:38:00 AM      MDM   Final diagnoses:  Low back pain  L1 vertebral fracture, closed, initial encounter    81 yo M with altered mental status. Will obtain altered mental status workup. Nonfocal neuro exam. Will obtain a CT scan of the abdomen and pelvis as well as the L-spine due to pain. Family feels that this is new pain.  Patient's workup largely unremarkable. CT of the L-spine concerning for acute L1 superior endplate fracture with 95% height loss. Patient's pain is treated in his symptoms spontaneously resolved. Patient was placed in a TLSO with improvement of symptomatology. Will follow with neurosurgery. Return precautions given.   I have discussed the diagnosis/risks/treatment options with the patient and family and believe the pt to be eligible for discharge home to follow-up with Neurosurgery. We also discussed returning to the ED immediately if new or worsening sx occur. We discussed the sx which are most concerning (e.g., sudden worsening pain, fever, cauda equina) that necessitate immediate return. Medications administered to the patient during their visit and any new prescriptions provided to the patient are listed below.  Medications given during this visit Medications  sodium chloride 0.9 % bolus 500 mL (0 mLs Intravenous Stopped 03/25/15 1151)  acetaminophen (TYLENOL) tablet 1,000 mg (1,000 mg Oral Given 03/25/15 1013)  HYDROmorphone (DILAUDID) injection 0.25 mg (0.25 mg Intravenous Given 03/25/15 1152)  iohexol (OMNIPAQUE) 300 MG/ML solution 50 mL (50 mLs Oral Contrast Given 03/25/15 1110)  iohexol (OMNIPAQUE) 300 MG/ML solution 80 mL (80 mLs Intravenous Contrast Given 03/25/15 1205)    Discharge Medication List as of 03/25/2015  1:36 PM    START taking these medications   Details  docusate sodium (COLACE) 100 MG capsule Take 1 capsule (100 mg total) by mouth every 12 (twelve) hours., Starting 03/25/2015, Until Discontinued, Print    oxyCODONE  (ROXICODONE) 5 MG immediate release tablet Take 0.5 tablets (2.5 mg total) by mouth every 4 (four) hours as needed for severe pain., Starting 03/25/2015, Until Discontinued, Print        The patient appears reasonably screen and/or stabilized for discharge and I doubt any other medical condition or other [facility] requiring further screening, evaluation, or treatment in the ED at this time prior to discharge.    Deno Etienne, DO 03/25/15 1601

## 2015-03-25 NOTE — Progress Notes (Signed)
CSW attempted to meet with patient at bedside. However, he was asleep. Son was present. Son states that the patient presents to [facility] due to him being sleep. Son stated " I couldn't get him up yesterday, all day".  Son informed CSW that the patient has an UTI. Also, he states that the patient is from [facility]. He states that the patient was suppose to discharge from the facility last Friday, but is still there due to him falling days before he was scheduled to leave.   Son states that he is interested in private duty nursing agencies. Nurse CM provided the son with a list of facilities.  Willette Brace [phone] ED CSW 03/25/2015 4:10 PM

## 2015-03-25 NOTE — Discharge Instructions (Signed)
Thoracolumbosacral Orthosis Brace A thoracolumbosacral orthosis (TLSO) brace can be worn for different purposes. A TLSO brace can be worn to support the back. Your back may need more support if you had a broken bone in your back (fractured vertebrae), back surgery, or you cannot move (paralysis) your torso muscles. A TLSO brace can also be worn by a child to help prevent curving back problems from getting worse as the child grows. TLSO braces are used to limit bending and twisting. The braces are made from a hard plastic. They are custom fit for each wearer. The TLSO brace covers both the front and back of the torso. On the back, the brace reaches from just above the tailbone to just below the shoulder blades. On the front, the brace covers the ribs and often reaches past the waist and over the hips. The brace can be worn under clothing.  HOME CARE INSTRUCTIONS  Ask your caregiver if you can remove your brace when showering or sleeping.  Follow your caregiver's instructions for putting on your brace.  Follow your caregiver's instructions about how much weight you can lift.  Always wear a T-shirt under the brace.  Make sure the brace is always well-aligned and fits you closely.  Check your skin daily for sores and redness caused by rubbing or pressure.  If you feel unsteady, use a cane or walker for support until you feel more steady.  Sit in high, firm chairs. It may be difficult to stand up from low, soft chairs.  Keep all follow-up appointments as directed by your caregiver. SEEK IMMEDIATE MEDICAL CARE IF:  You have a fever.  You have shortness of breath.  You have chest pain.  You have numbness, tingling, or pain. Developed in conjunction with the [employer] at [facility]   This information is not intended to replace advice given to you by your health care provider. Make sure you discuss any questions you have with your health care  provider.   Document Released: 04/22/2011 Document Revised: 07/26/2011 Document Reviewed: 04/22/2011 Elsevier Interactive Patient Education [employer].

## 2015-03-25 NOTE — Progress Notes (Signed)
Patient ID: Jay Guzman, male   DOB: 18-Jul-1932, 81 y.o.   MRN: [id number]     [facility]  PCP: Odette Fraction, MD  Code Status: Full Code   Allergies  Allergen Reactions  . Codeine Other (See Comments)    Reaction during surgical  Procedure-unknown  . Morphine And Related Other (See Comments)    Reaction unknown-during surgical procedure    Chief Complaint  Patient presents with  . Acute Visit     HPI:  81 y.o. patient is seen for acute concerns. He was sent to ED this am for altered mental status and was noted to have L1 vertebral fracture. TLSO brace was provided and neurosurgery outpatient follow up was recommended. He had a fall 10 days back per son who is present in the room. He is currently on antibiotic treatment for UTI. He has copd and is on oxygen. He has chf and is currently on high dose of lasix. He has dementia and this limits his participation in HPI and ROS. Son is present at bedside.    Review of Systems:  Unable to obtain  Past Medical History  Diagnosis Date  . Coronary disease     WITH REMOTE ANTERIOR MYOCARDIAL INFARCTION  . [place] thrombus of heart (HCC)     CHRONIC  . Hypertension   . Hyperlipidemia   . CHF (congestive heart failure) ([place])   . Dementia   . A-fib ([place])   . COPD (chronic obstructive pulmonary disease) ([place])   . Shortness of breath dyspnea    Past Surgical History  Procedure Laterality Date  . Cardiac catheterization  2000  . Hernia repair      STATUS POST BILATERAL  . Abdominal aortic aneurysm repair  2008   Social History:   reports that he has quit smoking. His smoking use included Cigarettes. He has a 60 pack-year smoking history. He does not have any smokeless tobacco history on file. He reports that he does not drink alcohol or use illicit drugs.  Family History  Problem Relation Age of Onset  . Cerebral aneurysm Father   . Cancer Sister   . Cancer Mother   . Heart attack Mother   . Heart  attack Father   . Stroke Paternal Grandmother   . Stroke Paternal Grandfather   . Hypertension Mother   . Hypertension Father   . Hypertension Sister   . Hypertension Brother     Medications:   Medication List       This list is accurate as of: 03/25/15  5:39 PM.  Always use your most recent med list.               acetaminophen 325 MG tablet  Commonly known as:  TYLENOL  Take 650 mg by mouth every 6 (six) hours as needed (for pain).     albuterol (2.5 MG/3ML) 0.083% nebulizer solution  Commonly known as:  PROVENTIL  Take 2.5 mg by nebulization every 4 (four) hours as needed for shortness of breath.     albuterol (2.5 MG/3ML) 0.083% nebulizer solution  Commonly known as:  PROVENTIL  Take 2.5 mg by nebulization 3 (three) times daily.     BALMEX 11.3 % Crea cream  Generic drug:  zinc oxide  Apply 1 application topically 2 (two) times daily. Apply to blanchable redness on buttocks after bathing     bisacodyl 10 MG suppository  Commonly known as:  DULCOLAX  Place 10 mg rectally as needed  for moderate constipation.     CARDURA 2 MG tablet  Generic drug:  doxazosin  Take 2 mg by mouth daily.     ciprofloxacin 500 MG tablet  Commonly known as:  CIPRO  Take 500 mg by mouth 2 (two) times daily. Started 11/03 for 7 days     diclofenac sodium 1 % Gel  Commonly known as:  VOLTAREN  Apply 4 g topically 2 (two) times daily.     digoxin 0.25 MG tablet  Commonly known as:  LANOXIN  Take 1 tablet (0.25 mg total) by mouth daily.     docusate sodium 100 MG capsule  Commonly known as:  COLACE  Take 1 capsule (100 mg total) by mouth every 12 (twelve) hours.     donepezil 5 MG tablet  Commonly known as:  ARICEPT  Take 5 mg by mouth at bedtime.     ezetimibe-simvastatin 10-40 MG tablet  Commonly known as:  VYTORIN  Take 1 tablet by mouth at bedtime.     furosemide 20 MG tablet  Commonly known as:  LASIX  Take 60 mg by mouth daily.     furosemide 40 MG tablet  Commonly  known as:  LASIX  Take 1.5 tablets (60 mg total) by mouth daily. Take one tablet by mouth once daily for CHF     guaiFENesin 600 MG 12 hr tablet  Commonly known as:  MUCINEX  Take 2 tablets (1,200 mg total) by mouth 2 (two) times daily.     magnesium hydroxide 400 MG/5ML suspension  Commonly known as:  MILK OF MAGNESIA  Take 30 mLs by mouth daily as needed for mild constipation.     metoprolol succinate 50 MG 24 hr tablet  Commonly known as:  TOPROL-XL  Take 50 mg by mouth daily.     mirtazapine 30 MG tablet  Commonly known as:  REMERON  Take 30 mg by mouth at bedtime.     nicotine 21 mg/24hr patch  Commonly known as:  NICODERM CQ - dosed in mg/24 hours  Place 21 mg onto the skin daily.     nitroGLYCERIN 0.4 MG SL tablet  Commonly known as:  NITROSTAT  Place 0.4 mg under the tongue every 5 (five) minutes as needed for chest pain.     ondansetron 4 MG tablet  Commonly known as:  ZOFRAN  Take 4 mg by mouth every 8 (eight) hours as needed for nausea or vomiting.     oxyCODONE 5 MG immediate release tablet  Commonly known as:  ROXICODONE  Take 0.5 tablets (2.5 mg total) by mouth every 4 (four) hours as needed for severe pain.     OXYGEN  Inhale 2 L into the lungs continuous.     RA SALINE ENEMA 19-7 GM/118ML Enem  Place 1 each rectally as needed (for constipation).     saccharomyces boulardii 250 MG capsule  Commonly known as:  FLORASTOR  Take 250 mg by mouth 2 (two) times daily. Started 11/02 for 10 days     SAPHRIS 10 MG Subl  Generic drug:  Asenapine Maleate  Place 10 mg under the tongue 2 (two) times daily.     STIOLTO RESPIMAT 2.5-2.5 MCG/ACT Aers  Generic drug:  Tiotropium Bromide-Olodaterol  INHALE 2 PUFFS INTO THE LUNGS DAILY.     warfarin 6 MG tablet  Commonly known as:  COUMADIN  Take 6 mg by mouth daily. Take with 2 mg for a total of 8 mg daily  warfarin 2 MG tablet  Commonly known as:  COUMADIN  Take 2 mg by mouth daily. Take with 6 mg for a total  of 8 mg daily         Physical Exam: Filed Vitals:   03/25/15 1735  BP: 102/63  Pulse: 72  Temp: 97 F (36.1 C)  Resp: 18  SpO2: 96%    General- elderly male, frail and chronically ill appearing, in no acute distress Head- normocephalic, atraumatic Nose- normal nasal mucosa, no maxillary or frontal sinus tenderness, no nasal discharge Throat- moist mucus membrane Eyes- PERRLA, EOMI, no pallor, no icterus, no discharge, normal conjunctiva, normal sclera Neck- no cervical lymphadenopathy, no jugular vein distension Cardiovascular- irrergular heart rate, 1+ leg edema Respiratory- bilateral poor air entry, no wheeze, no rhonchi, basilar crackles +, no use of accessory muscles, on o2 Abdomen- bowel sounds present, soft, non tender Musculoskeletal- able to move all 4 extremities, generalized weakness Neurological- unable to assess Skin- warm and dry Psychiatry- calm this visit   Labs reviewed: Basic Metabolic Panel:  Recent Labs  01/26/15 0547 01/29/15 0308  02/24/15 0419  03/11/15 1302 03/25/15 1000 03/25/15 1012  NA 144 139  < > 141  < > 141 146* 146*  K 4.5 4.3  < > 4.4  < > 4.0 3.9 3.8  CL 103 93*  < > 104  --  103 105 101  CO2 35* 39*  < > 31  --  30 34*  --   GLUCOSE 112* 105*  < > 158*  --  94 58* 92  BUN 28* 38*  < > 39*  < > 25 42* 40*  CREATININE 1.24 1.38*  < > 1.20  < > 1.14* 1.29* 1.20  CALCIUM 8.4* 8.6*  < > 8.4*  --  8.5* 8.5*  --   MG 2.3 2.3  --  2.2  --   --   --   --   < > = values in this interval not displayed. Liver Function Tests:  Recent Labs  01/10/15 1014 02/22/15 1037 03/25/15 1000  AST 33 22 22  ALT 49* 19 20  ALKPHOS 47 49 62  BILITOT 1.2 1.9* 1.8*  PROT 4.9* 5.3* 5.7*  ALBUMIN 3.3* 3.2* 3.4*   No results for input(s): LIPASE, AMYLASE in the last 8760 hours.  Recent Labs  02/22/15 1038 03/25/15 1000  AMMONIA 18 23   CBC:  Recent Labs  01/10/15 1014  02/22/15 0702 02/24/15 0419 02/25/15 0411 03/04/15  03/25/15 1000 03/25/15 1012  WBC 7.6  < > 9.3 9.7 11.1* 8.0 7.5  --   NEUTROABS 5.7  --  6.7  --   --   --  5.2  --   HGB 16.1  < > 15.9 14.4 14.1 14.1 15.1 16.3  HCT 49.5  < > 48.5 44.7 43.8 44 48.1 48.0  MCV 100.6*  < > 102.1* 101.6* 101.6*  --  105.0*  --   PLT 121*  < > 111* 89* 87* 106* 161  --   < > = values in this interval not displayed. Cardiac Enzymes:  Recent Labs  02/22/15 2111 02/23/15 0300 02/24/15 1433  TROPONINI 0.06* 0.08* 0.05*   BNP: Invalid input(s): POCBNP CBG:  Recent Labs  02/24/15 2119 02/25/15 1145 03/25/15 0912  GLUCAP 164* 140* 83    Radiological Exams: X-ray Chest Pa And Lateral  02/23/2015  CLINICAL DATA:  Followup pneumonia. Chronic obstructive lung disease. EXAM: CHEST  2 VIEW COMPARISON:  02/22/2015 and previous FINDINGS: The heart is enlarged. There is atherosclerosis of the aorta. There is a worsened appearance of interstitial markings and patchy lung density bilaterally. The findings could go along with fluid overload/early edema, but patchy bilateral bronchopneumonia is possible. There may be a small amount of pleural fluid in the posterior costophrenic angles. IMPRESSION: Background pattern of chronic lung disease with a worsened appearance of interstitial markings bilaterally that could go along with edema or patchy bilateral bronchopneumonia. Electronically Signed   By: Nelson Chimes M.D.   On: 02/23/2015 09:09   Dg Chest Port 1 View  02/22/2015  CLINICAL DATA:  Dyspnea. EXAM: PORTABLE CHEST 1 VIEW COMPARISON:  01/23/2015 chest radiograph FINDINGS: Stable cardiomediastinal silhouette with mild cardiomegaly. No pneumothorax. No pleural effusion. There is cephalization of the pulmonary vasculature without overt pulmonary edema. Curvilinear opacities at the lung bases likely represent atelectasis. IMPRESSION: 1. Stable mild cardiomegaly without overt pulmonary edema. 2. Curvilinear bibasilar lung opacities, favor mild atelectasis. Electronically  Signed   By: Ilona Sorrel M.D.   On: 02/22/2015 07:27   03/25/15 ct abdomen/pelvis IMPRESSION: 1. Cardiomegaly and significant coronary artery disease. Stable left ventricular aneurysm an intraluminal thrombus. 2. Bilateral renal cysts.  No hydronephrosis. 3. Significant stool burden.  Colonic diverticulosis. 4. Normal appendix. 5. Stable appearance of L1 superior endplate fracture without further compression. 6. Significant atherosclerosis of the abdominal aorta and branches.  7.  Layering stones or sludge in the gallbladder. Electronically Signed  03/25/15 ct lumbar spine IMPRESSION: 1. Acute/subacute superior endplate fracture at L1 with approximately 30% loss of height but no retropulsion of bone. 2. Mild subarticular narrowing bilaterally at L2-3. 3. Left greater than right subarticular stenosis at L3-4. 4. Mild central canal stenosis with moderate right and mild left foraminal narrowing at L4-5. 5. Leftward disc bulging at L5-S1 without significant stenosis.   03/25/15 ct head IMPRESSION: 1. No acute intracranial abnormality. Stable noncontrast CT appearance of the brain with mild to moderate white matter changes. 2. Intravenous gas at the skullbase most likely related to recent intravenous access.  03/25/15 chest xray IMPRESSION: Stable patchy interstitial thickening and scarring, stable. No frank edema or consolidation. Heart prominent but stable. Atherosclerotic calcification noted. Left anterior descending coronary artery calcification also noted.      Assessment/Plan  Altered mental status Sleepy this visit. His dementia, ongoing UTI, deconditioning could all be contributing to this along with his co-morbidities. Intracranial bleed, acute stroke, chest infection, abdominal infection have been ruled out. continue and complete course of ciprofloxacin. D/c aricept for now as this can add on to somnolence. His saphris will be decreased to 5 mg bid for now as this can  add on to the somnolence as well. Monitor clinically. decline anticipated with his dementia. If no improvement, consider revisiting goals of care with family  UTI Continue and complete course of ciprofloxacin 500 mg bid on 03/27/15. Monitor clinically  L1 vertebral fracture TLSO brace when out of bed, back precautions and neurosurgery referral for now to assess for possible kyphoplasty. Space oxyIR to 5 mg 1 tab q6h prn pain with tylenol 500 mg 2 tab bid for now and reassess. Fall precautions  Constipation Stool burden noted on ct abdomen. Currently on colace 100 mg bid, dulcolax suppository prn. Will have him on fleet enema x 1 now  afib Rate currently controlled. Continue digoxin 0.25 mg daily and toprol xl 50 mg daily with coumadin for anticoagulation with goal inr 2-3.  CHF Daily weight check for now. Currently  on lasix 60 mg daily, toprol xl 50 mg daily, if continues to gain weight consider adding a small dose of metolazone to help with diuresis. Continue o2  Family/ staff Communication: reviewed care plan with patient's son and nursing supervisor    Blanchie Serve, MD  [facility] [phone] (Monday-Friday 8 am - 5 pm) [phone] (afterhours)

## 2015-03-25 NOTE — ED Notes (Signed)
Bed: WA14 Expected date:  Expected time:  Means of arrival:  Comments: AMS 

## 2015-03-25 NOTE — ED Notes (Signed)
Pt's son requests resources for a "nurse or someone that can sit with the patient at [facility] to hopefully prevent falls." Case management contacted-will provide resources for patient.

## 2015-03-25 NOTE — ED Notes (Signed)
[employer] contacted regarding TLSO brace. Will be here promptly to apply brace.

## 2015-03-25 NOTE — ED Notes (Signed)
Pt [facility].  Per son's report to EMS:  Pt was fine yesterday am then went into a "sleepy mode" and has been that way since.  Responsive to verbal stimuli per ems.  Hx UTI in past.  VSS & CBG 114 by EMS.

## 2015-03-26 ENCOUNTER — Telehealth: Payer: Self-pay | Admitting: Cardiology

## 2015-03-26 LAB — URINE CULTURE: Culture: NO GROWTH

## 2015-03-26 NOTE — Telephone Encounter (Signed)
Jay Guzman states his dad is in a nursing facility and the provider there wants to decrease his fluid pill.  He wants to speak with Dr. [place] before the staff starts that.

## 2015-03-26 NOTE — Progress Notes (Signed)
Late entry for 03/25/15 1445  Cm spoke with pt's son at bedside Pt went to snf for short term rehab but has had uti and fallen  CM reviewed in details medicare guidelines, home health [facility]) (length of stay in home, types of [facility] staff available, coverage, primary caregiver, up to 24 hrs before services may be started) and Private duty nursing (PDN-coverage, length of stay in the home types of staff available). CM reviewed availability of [place] SW to assist pcp to get pt to snf (if desired disposition) from the community level. CM provided son with a list of [place] home health agencies and PDN.  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

## 2015-03-27 NOTE — Telephone Encounter (Signed)
Returned call. Advised have provider call us if any concerns, make adjustments as warranted, if pt or family concerned for any changes r/t swelling, SOB, weight gain, address w/ facility provider & if necessary call us back.  Caller verbalized understanding.

## 2015-03-30 NOTE — Clinical Social Work Note (Signed)
Clinical Social Work Assessment  Patient Details  Name: Jay Guzman MRN: [id number] Date of Birth: [birth year]  Date of referral:  02/24/15               Reason for consult:  Facility Placement (Patien's son requests change in SNF)                Permission sought to share information with:  Facility Sport and exercise psychologist, Family Supports Permission granted to share information::  Yes, Hospital doctor (By Son- patient is pleasantly confused)   Housing/Transportation Living arrangements for the past 2 months:  [place] of Information:  Patient, Adult Children Patient Interpreter Needed:  None Criminal Activity/Legal Involvement Pertinent to Current Situation/Hospitalization:  No - Comment as needed Significant Relationships:  Adult Children Lives with:  Facility Resident Do you feel safe going back to the place where you live?  Yes (Son agrees to return to [place] if no other option) Need for family participation in patient care:  Yes (Comment)  Care giving concerns:  Current resident of [facility]. Son requesting change in facility due to concerns about care issues at facility.  Social Worker assessment / plan:  CSW met with patient briefly who was noted to be alert to person only. Very pleasant- he deferred to his son Jay Guzman for decision making. CSW spoke to [place] via phone who indicated a desire to move his father from [place]. CSW discussed this process and he gave approval to initiate bed search.  Fl2 initiated and placed on chart for MD's signature.  Patient's other son Jay Guzman also supports this request.    Employment status:  Retired Nurse, adult PT Recommendations:  [place] / Referral to community resources:  [place]  Patient/Family's Response to care:  Family is pleased with current hospital care but were unhappy with care in  SNF.  Patient/Family's Understanding of and Emotional Response to Diagnosis, Current Treatment, and Prognosis: Sons appear to be involved and supportive of patient's care needs and have a fairly good understanding of his diagnosis, treatment and prognosis.  Patient is not oriented to place, time, situation.  Patient presents as being relaxed and comfortable in bed- denies any needs at this time.   Emotional Assessment Appearance:  Appears stated age Attitude/Demeanor/Rapport:   (Quiet, not invovled in d/c process,confused) Affect (typically observed):  Pleasant, Quiet, Calm Orientation:  Oriented to Self Alcohol / Substance use:   None Psych involvement (Current and /or in the community):   No  Discharge Needs  Concerns to be addressed:  Care Coordination Readmission within the last 30 days:  Yes Current discharge risk:  Cognitively Impaired, Dependent with Mobility Barriers to Discharge:  Continued Medical Work up   Kendell Bane T, LCSW 03/27/2015; 12:45 PM

## 2015-03-30 NOTE — Clinical Social Work Placement (Signed)
   CLINICAL SOCIAL WORK PLACEMENT  NOTE  Date:  02/25/2015 Patient Details  Name: Jay Guzman MRN: [id number] Date of Birth: 02/08/1933  Clinical Social Work is seeking post-discharge placement for this patient at the [place] (Wants to change facilities) level of care (*CSW will initial, date and re-position this form in  chart as items are completed):  Yes   Patient/family provided with [place] Work Department's list of facilities offering this level of care within the geographic area requested by the patient (or if unable, by the patient's family).  Yes   Patient/family informed of their freedom to choose among providers that offer the needed level of care, that participate in Medicare, Medicaid or managed care program needed by the patient, have an available bed and are willing to accept the patient.  Yes   Patient/family informed of [place]'s ownership interest in [facility] and [facility], as well as of the fact that they are under no obligation to receive care at these facilities.  PASRR submitted to EDS on       PASRR number received on       Existing PASRR number confirmed on 02/24/15     FL2 transmitted to all facilities in geographic area requested by pt/family on 03/27/15     FL2 transmitted to all facilities within larger geographic area on       Patient informed that his/her managed care company has contracts with or will negotiate with certain facilities, including the following:   [facility])     Yes   Patient/family informed of bed offers received.  Patient chooses bed at [facility]     Physician recommends and patient chooses bed at      Patient to be transferred to [facility] on 02/25/15.  Patient to be transferred to facility by Ambulance [facility])     Patient family notified on 02/25/15 of transfer.  Name of family member notified:  Tharon Aquas  and [place] Please prepare priority discharge summary, including medications, Please sign FL2, Please prepare prescriptions     Additional Comment: Per MD- patient is stable for d/c today to SNF. CSW completed arrangements for change in SNF to [facility] per request of patient's sons.  Nursing notified to call report.  Sons were pleased with change in facility.  No further CSW neeeds identified. EMS arranged and DC summary sent to facility for review.  CSW signing of.  Lorie Phenix. Murrell Redden [id number]      _______________________________________________ Williemae Area, LCSW 02/25/2015  2:36 PM

## 2015-03-30 NOTE — Clinical Social Work Placement (Addendum)
   CLINICAL SOCIAL WORK PLACEMENT  NOTE  Date:  02/25/2015 Patient Details  Name: Jay Guzman MRN: [id number] Date of Birth: [birth year]  Clinical Social Work is seeking post-discharge placement for this patient at the [place] (Wants to change facilities) level of care (*CSW will initial, date and re-position this form in  chart as items are completed):  Yes   Patient/family provided with [place] Work Department's list of facilities offering this level of care within the geographic area requested by the patient (or if unable, by the patient's family).  Yes   Patient/family informed of their freedom to choose among providers that offer the needed level of care, that participate in Medicare, Medicaid or managed care program needed by the patient, have an available bed and are willing to accept the patient.  Yes   Patient/family informed of [place]'s ownership interest in [facility] and [facility], as well as of the fact that they are under no obligation to receive care at these facilities.  PASRR submitted to EDS on       PASRR number received on       Existing PASRR number confirmed on 02/24/15     FL2 transmitted to all facilities in geographic area requested by pt/family on 03/27/15     FL2 transmitted to all facilities within larger geographic area on       Patient informed that his/her managed care company has contracts with or will negotiate with certain facilities, including the following:   [facility])         Patient/family informed of bed offers received.  Patient chooses bed at       Physician recommends and patient chooses bed at      Patient to be transferred to   on  .  Patient to be transferred to facility by       Patient family notified on   of transfer.  Name of family member notified:        PHYSICIAN Please prepare priority discharge summary, including medications, Please sign FL2      Additional Comment:    _______________________________________________ Williemae Area, LCSW 02/24/2015 1:00 PM

## 2015-04-04 ENCOUNTER — Non-Acute Institutional Stay (SKILLED_NURSING_FACILITY): Payer: Medicare Other | Admitting: Nurse Practitioner

## 2015-04-04 DIAGNOSIS — S32018D Other fracture of first lumbar vertebra, subsequent encounter for fracture with routine healing: Secondary | ICD-10-CM

## 2015-04-04 DIAGNOSIS — N183 Chronic kidney disease, stage 3 unspecified: Secondary | ICD-10-CM

## 2015-04-04 DIAGNOSIS — I110 Hypertensive heart disease with heart failure: Secondary | ICD-10-CM | POA: Diagnosis not present

## 2015-04-04 DIAGNOSIS — I481 Persistent atrial fibrillation: Secondary | ICD-10-CM

## 2015-04-04 DIAGNOSIS — I5042 Chronic combined systolic (congestive) and diastolic (congestive) heart failure: Secondary | ICD-10-CM

## 2015-04-04 DIAGNOSIS — F0390 Unspecified dementia without behavioral disturbance: Secondary | ICD-10-CM

## 2015-04-04 DIAGNOSIS — I4819 Other persistent atrial fibrillation: Secondary | ICD-10-CM

## 2015-04-04 DIAGNOSIS — J449 Chronic obstructive pulmonary disease, unspecified: Secondary | ICD-10-CM

## 2015-04-04 NOTE — Progress Notes (Signed)
Patient ID: Jay Guzman, male   DOB: 05/28/1932, 81 y.o.   MRN: [id number]    Nursing Home Location:  [place] of Service: SNF (31)  PCP: Odette Fraction, MD  Allergies  Allergen Reactions  . Codeine Other (See Comments)    Reaction during surgical  Procedure-unknown  . Morphine And Related Other (See Comments)    Reaction unknown-during surgical procedure    Chief Complaint  Patient presents with  . Discharge Note    HPI:  Patient is a 81 y.o. male seen today at [facility] for discharge home. Pt with a hx of dementia, COPD, CHF, afib, CAD, HTN. Pt was found to have COPD exacerbation and hospitalized. Treated with nebs, antibiotics and steroids with improvement and now at [facility] for ongoing treatment of COPD exacerbation and for strength training. While at [facility] pt  had a fall on 03/15/15 and was sent to the ED. Xrays were negative for any fracture originally but was later found to have L1 vertebral fracture. TLSO brace was provided and neurosurgery outpatient recommended. CT was negative for bleed. Ptwas found to have a UTI from Pseudomonas and completed course of Cipro.Patient currently doing well with therapy, now stable to discharge home with son for care and with home health.  Review of Systems:  Review of Systems  Unable to perform ROS: Dementia    Past Medical History  Diagnosis Date  . Coronary disease     WITH REMOTE ANTERIOR MYOCARDIAL INFARCTION  . [place] thrombus of heart (HCC)     CHRONIC  . Hypertension   . Hyperlipidemia   . CHF (congestive heart failure) ([place])   . Dementia   . A-fib ([place])   . COPD (chronic obstructive pulmonary disease) ([place])   . Shortness of breath dyspnea    Past Surgical History  Procedure Laterality Date  . Cardiac catheterization  2000  . Hernia repair      STATUS POST BILATERAL  . Abdominal aortic aneurysm repair  2008   Social History:   reports that he  has quit smoking. His smoking use included Cigarettes. He has a 60 pack-year smoking history. He does not have any smokeless tobacco history on file. He reports that he does not drink alcohol or use illicit drugs.  Family History  Problem Relation Age of Onset  . Cerebral aneurysm Father   . Cancer Sister   . Cancer Mother   . Heart attack Mother   . Heart attack Father   . Stroke Paternal Grandmother   . Stroke Paternal Grandfather   . Hypertension Mother   . Hypertension Father   . Hypertension Sister   . Hypertension Brother     Medications: Patient's Medications  New Prescriptions   No medications on file  Previous Medications   ACETAMINOPHEN (TYLENOL) 325 MG TABLET    Take 650 mg by mouth every 6 (six) hours as needed (for pain).   ALBUTEROL (PROVENTIL) (2.5 MG/3ML) 0.083% NEBULIZER SOLUTION    Take 2.5 mg by nebulization every 4 (four) hours as needed for shortness of breath.    ALBUTEROL (PROVENTIL) (2.5 MG/3ML) 0.083% NEBULIZER SOLUTION    Take 2.5 mg by nebulization 3 (three) times daily.   ASENAPINE MALEATE (SAPHRIS) 10 MG SUBL    Place 10 mg under the tongue 2 (two) times daily.   BISACODYL (DULCOLAX) 10 MG SUPPOSITORY    Place 10 mg rectally as needed for moderate constipation.  DICLOFENAC SODIUM (VOLTAREN) 1 % GEL    Apply 4 g topically 2 (two) times daily.    DIGOXIN (LANOXIN) 0.25 MG TABLET    Take 1 tablet (0.25 mg total) by mouth daily.   DOCUSATE SODIUM (COLACE) 100 MG CAPSULE    Take 1 capsule (100 mg total) by mouth every 12 (twelve) hours.   DONEPEZIL (ARICEPT) 5 MG TABLET    Take 5 mg by mouth at bedtime.    DOXAZOSIN (CARDURA) 2 MG TABLET    Take 2 mg by mouth daily.    EZETIMIBE-SIMVASTATIN (VYTORIN) 10-40 MG TABLET    Take 1 tablet by mouth at bedtime.    FUROSEMIDE (LASIX) 20 MG TABLET    Take 60 mg by mouth daily.   GUAIFENESIN (MUCINEX) 600 MG 12 HR TABLET    Take 2 tablets (1,200 mg total) by mouth 2 (two) times daily.   MAGNESIUM HYDROXIDE (MILK OF  MAGNESIA) 400 MG/5ML SUSPENSION    Take 30 mLs by mouth daily as needed for mild constipation.   METOPROLOL SUCCINATE (TOPROL-XL) 50 MG 24 HR TABLET    Take 50 mg by mouth daily.    MIRTAZAPINE (REMERON) 30 MG TABLET    Take 30 mg by mouth at bedtime.    NITROGLYCERIN (NITROSTAT) 0.4 MG SL TABLET    Place 0.4 mg under the tongue every 5 (five) minutes as needed for chest pain.    ONDANSETRON (ZOFRAN) 4 MG TABLET    Take 4 mg by mouth every 8 (eight) hours as needed for nausea or vomiting.   OXYCODONE (OXY-IR) 5 MG CAPSULE    Take 5 mg by mouth every 6 (six) hours as needed.   OXYGEN    Inhale 2 L into the lungs continuous.   SACCHAROMYCES BOULARDII (FLORASTOR) 250 MG CAPSULE    Take 250 mg by mouth 2 (two) times daily. Started 11/02 for 10 days   SODIUM PHOSPHATES (RA SALINE ENEMA) 19-7 GM/118ML ENEM    Place 1 each rectally as needed (for constipation).   STIOLTO RESPIMAT 2.5-2.5 MCG/ACT AERS    INHALE 2 PUFFS INTO THE LUNGS DAILY.   WARFARIN (COUMADIN) 6 MG TABLET    Take 7 mg by mouth daily. Take with 1 mg for a total of 7 mg daily   ZINC OXIDE (BALMEX) 11.3 % CREA CREAM    Apply 1 application topically 2 (two) times daily. Apply to blanchable redness on buttocks after bathing  Modified Medications   No medications on file  Discontinued Medications   CIPROFLOXACIN (CIPRO) 500 MG TABLET    Take 500 mg by mouth 2 (two) times daily. Started 11/03 for 7 days   FUROSEMIDE (LASIX) 40 MG TABLET    Take 1.5 tablets (60 mg total) by mouth daily. Take one tablet by mouth once daily for CHF   NICOTINE (NICODERM CQ - DOSED IN MG/24 HOURS) 21 MG/24HR PATCH    Place 21 mg onto the skin daily.   OXYCODONE (ROXICODONE) 5 MG IMMEDIATE RELEASE TABLET    Take 0.5 tablets (2.5 mg total) by mouth every 4 (four) hours as needed for severe pain.   WARFARIN (COUMADIN) 2 MG TABLET    Take 1 mg by mouth daily. Take with 6 mg for a total of 8 mg daily     Physical Exam: Filed Vitals:   04/04/15 1320  BP: 142/59    Pulse: 82  Temp: 98 F (36.7 C)  Resp: 20    Physical Exam  Constitutional: He  is oriented to person, place, and time. No distress.  Frail elderly male  HENT:  Head: Normocephalic and atraumatic.  Mouth/Throat: Oropharynx is clear and moist. No oropharyngeal exudate.  Eyes: Conjunctivae and EOM are normal. Pupils are equal, round, and reactive to light.  Neck: Normal range of motion. Neck supple.  Cardiovascular: Normal rate, regular rhythm and normal heart sounds.   Pulmonary/Chest: Effort normal. He has no wheezes.  diminished in the bases  Abdominal: Soft. Bowel sounds are normal.  Musculoskeletal: He exhibits edema (1+ bilaterally) and tenderness.  Neurological: He is alert and oriented to person, place, and time.  Skin: Skin is warm and dry. He is not diaphoretic.  Psychiatric: He has a normal mood and affect.  Memory loss    Labs reviewed: Basic Metabolic Panel:  Recent Labs  01/26/15 0547 01/29/15 0308  02/24/15 0419  03/11/15 1302 03/25/15 1000 03/25/15 1012  NA 144 139  < > 141  < > 141 146* 146*  K 4.5 4.3  < > 4.4  < > 4.0 3.9 3.8  CL 103 93*  < > 104  --  103 105 101  CO2 35* 39*  < > 31  --  30 34*  --   GLUCOSE 112* 105*  < > 158*  --  94 58* 92  BUN 28* 38*  < > 39*  < > 25 42* 40*  CREATININE 1.24 1.38*  < > 1.20  < > 1.14* 1.29* 1.20  CALCIUM 8.4* 8.6*  < > 8.4*  --  8.5* 8.5*  --   MG 2.3 2.3  --  2.2  --   --   --   --   < > = values in this interval not displayed. Liver Function Tests:  Recent Labs  01/10/15 1014 02/22/15 1037 03/25/15 1000  AST 33 22 22  ALT 49* 19 20  ALKPHOS 47 49 62  BILITOT 1.2 1.9* 1.8*  PROT 4.9* 5.3* 5.7*  ALBUMIN 3.3* 3.2* 3.4*   No results for input(s): LIPASE, AMYLASE in the last 8760 hours.  Recent Labs  02/22/15 1038 03/25/15 1000  AMMONIA 18 23   CBC:  Recent Labs  01/10/15 1014  02/22/15 0702 02/24/15 0419 02/25/15 0411 03/04/15 03/25/15 1000 03/25/15 1012  WBC 7.6  < > 9.3 9.7 11.1*  8.0 7.5  --   NEUTROABS 5.7  --  6.7  --   --   --  5.2  --   HGB 16.1  < > 15.9 14.4 14.1 14.1 15.1 16.3  HCT 49.5  < > 48.5 44.7 43.8 44 48.1 48.0  MCV 100.6*  < > 102.1* 101.6* 101.6*  --  105.0*  --   PLT 121*  < > 111* 89* 87* 106* 161  --   < > = values in this interval not displayed. TSH: No results for input(s): TSH in the last 8760 hours. A1C: No results found for: HGBA1C Lipid Panel: No results for input(s): CHOL, HDL, LDLCALC, TRIG, CHOLHDL, LDLDIRECT in the last 8760 hours.  Radiological Exams: Dg Chest 2 View  03/25/2015  CLINICAL DATA:  Shortness of breath and cough. Altered mental status EXAM: CHEST  2 VIEW COMPARISON:  February 23, 2015 and August 26, 2014 FINDINGS: There is stable scarring throughout the lungs bilaterally with areas of stable interstitial prominence, stable. There is no edema or consolidation. The heart is borderline enlarged with pulmonary vascularity within normal limits. No adenopathy. No bone lesions. There is atherosclerotic calcification in  the aorta. There is also calcification in the left anterior descending coronary artery. IMPRESSION: Stable patchy interstitial thickening and scarring, stable. No frank edema or consolidation. Heart prominent but stable. Atherosclerotic calcification noted. Left anterior descending coronary artery calcification also noted. Electronically Signed   By: Lowella Grip III M.D.   On: 03/25/2015 10:57   Ct Head Wo Contrast  03/25/2015  CLINICAL DATA:  81 year old male with generalized weakness and altered mental status. Initial encounter. EXAM: CT HEAD WITHOUT CONTRAST TECHNIQUE: Contiguous axial images were obtained from the base of the skull through the vertex without intravenous contrast. COMPARISON:  03/15/2015. FINDINGS: Intravenous gas incidentally noted about the skullbase including at the left IJ and right cavernous sinus. Visualized paranasal sinuses and mastoids are clear. No acute osseous abnormality identified.  Negative orbit and scalp soft tissues. Stable cerebral volume. No midline shift, mass effect, or evidence of intracranial mass lesion. Extensive Calcified atherosclerosis at the skull base. No acute intracranial hemorrhage identified. No cortically based acute infarct identified. Patchy cerebral white matter hypodensity appears stable and is moderate for age. No suspicious intracranial vascular hyperdensity. IMPRESSION: 1. No acute intracranial abnormality. Stable noncontrast CT appearance of the brain with mild to moderate white matter changes. 2. Intravenous gas at the skullbase most likely related to recent intravenous access. Electronically Signed   By: Genevie Ann M.D.   On: 03/25/2015 09:42   Ct Lumbar Spine Wo Contrast  03/25/2015  CLINICAL DATA:  Low back pain. The patient has become less responsive. EXAM: CT LUMBAR SPINE WITHOUT CONTRAST TECHNIQUE: Multidetector CT imaging of the lumbar spine was performed without intravenous contrast administration. Multiplanar CT image reconstructions were also generated. COMPARISON:  CT of the abdomen and pelvis 07/31/2008. FINDINGS: In age indeterminate superior endplate fracture is present at L1 with approximately 30% loss of height. The fracture appears to be incompletely healed. There is no retropulsion of bone. Vertebral body heights are otherwise maintained. Grade 1 anterolisthesis at L4-5 measures 5 mm. Soft tissues demonstrate extensive atherosclerotic calcifications. There stranding about the kidneys. A posterior exophytic cysts are again noted at both kidneys. T12-L1: Mild facet hypertrophy is present bilaterally without significant stenosis. L1-2: Mild facet hypertrophy is present bilaterally. There is no significant stenosis. L2-3: A broad-based disc protrusion is asymmetric to the left. Mild facet hypertrophy contributes to mild subarticular narrowing bilaterally. L3-4: Left greater than right subarticular stenosis is secondary to a leftward disc protrusion  and moderate facet hypertrophy. Mild left foraminal narrowing is also present. L4-5: Uncovering number broad-based disc protrusion results in mild central canal stenosis with right greater than left subarticular narrowing. Moderate right and mild left foraminal stenosis is present. L5-S1: Mild leftward disc bulging is present without significant stenosis. IMPRESSION: 1. Acute/subacute superior endplate fracture at L1 with approximately 30% loss of height but no retropulsion of bone. 2. Mild subarticular narrowing bilaterally at L2-3. 3. Left greater than right subarticular stenosis at L3-4. 4. Mild central canal stenosis with moderate right and mild left foraminal narrowing at L4-5. 5. Leftward disc bulging at L5-S1 without significant stenosis. Electronically Signed   By: San Morelle M.D.   On: 03/25/2015 11:15   Ct Abdomen Pelvis W Contrast  03/25/2015  ADDENDUM REPORT: 03/25/2015 13:09 ADDENDUM: 7.  Layering stones or sludge in the gallbladder. Electronically Signed   By: Nolon Nations M.D.   On: 03/25/2015 13:09  03/25/2015  CLINICAL DATA:  Pt from [facility]. Per son's report to EMS: Pt was fine yesterday am then went into  a "sleepy mode" and has been that way since. Responsive to verbal stimuli per ems. Hx UTI in past. EXAM: CT ABDOMEN AND PELVIS WITH CONTRAST TECHNIQUE: Multidetector CT imaging of the abdomen and pelvis was performed using the standard protocol following bolus administration of intravenous contrast. CONTRAST:  85mL OMNIPAQUE IOHEXOL 300 MG/ML SOLN, 39mL OMNIPAQUE IOHEXOL 300 MG/ML SOLN COMPARISON:  07/31/2008 FINDINGS: Lower chest: Heart is enlarged. There is significant coronary artery disease. Again noted is left ventricular aneurysm associated with thrombus. The appearance is stable. There is no pericardial effusion. There is scarring at the lung bases. Upper abdomen: No focal abnormality identified within the liver, spleen, pancreas. There is prominence of the  adrenal glands bilaterally, which appears symmetric. No focal adrenal mass identified. There are bilateral renal masses. These are low-attenuation, consistent with numerous cysts. The largest is adjacent to the left kidney, now measuring 7.2 cm. Previously this cyst measured 5.2 cm. A left lateral hyperdense cyst is 1.9 cm in diameter. No hydronephrosis. Gallbladder contains layering hyperdense debris, sludge, or stones. Gastrointestinal tract: The stomach and small bowel loops are normal in appearance. The appendix is well seen and has a normal appearance. Colonic loops are normal in appearance the contain significant amount of stool. There are rare colonic diverticula. Pelvis: The urinary bladder is normal in appearance. Prostatic calcifications are present. Seminal vesicles are normal in appearance. No free pelvic fluid. Retroperitoneum: There is dense atherosclerosis of the abdominal aorta and its branches. Abdominal wall: Unremarkable. Osseous structures: There is a subacute superior endplate fracture of L1, stable in appearance. No further compression since prior studies. There is 5 mm anterolisthesis of L4 on L5, stable in appearance. No suspicious lytic or blastic lesions are identified. IMPRESSION: 1. Cardiomegaly and significant coronary artery disease. Stable left ventricular aneurysm an intraluminal thrombus. 2. Bilateral renal cysts.  No hydronephrosis. 3. Significant stool burden.  Colonic diverticulosis. 4. Normal appendix. 5. Stable appearance of L1 superior endplate fracture without further compression. 6. Significant atherosclerosis of the abdominal aorta and branches. Electronically Signed: By: Nolon Nations M.D. On: 03/25/2015 12:43    Assessment/Plan 1. Chronic combined systolic and diastolic heart failure, NYHA class 2 (HCC) Stable at this time, conts on lasix 60 mg daily, Toprol xl50 mg daily and oxygen   2. Persistent atrial fibrillation (HCC) Rate currently controlled. Continue  digoxin 0.25 mg daily and toprol xl 50 mg daily with coumadin for anticoagulation with goal inr 2-3. Home health nursing to follow up INR  3. Chronic obstructive pulmonary disease, unspecified COPD type ([place]) Remains stable, conts on O2 with mucinex and nebs  4. Dementia, without behavioral disturbance Advanced dementia, overall expect decline as disease progresses, stable at this time.  5. CKD (chronic kidney disease), stage III BUN and Cr stable, to avoid dehydration and NSAIDs  6. Hypertensive heart disease with CHF ([place]) Blood pressure stable, conts current regimen.   7. Other closed fracture of first lumbar vertebra with routine healing, subsequent encounter Pain controlled at this time, cont TLSO brace and will need neurosurgery outpatient follow up.   pt is stable for discharge-will need PT/OT/Nursing/HHA per home health. DME needed 3n1, hospital bed, O2, . Rx written.  will need to follow up with PCP within 2 weeks.     Carlos American. Harle Battiest  [facility] [phone] 8 am - 5 pm) [phone] (after hours)

## 2015-04-07 ENCOUNTER — Inpatient Hospital Stay: Payer: Medicare Other | Admitting: Family Medicine

## 2015-04-09 ENCOUNTER — Encounter: Payer: Self-pay | Admitting: Cardiology

## 2015-04-09 ENCOUNTER — Ambulatory Visit (INDEPENDENT_AMBULATORY_CARE_PROVIDER_SITE_OTHER): Payer: Medicare Other | Admitting: Cardiology

## 2015-04-09 ENCOUNTER — Telehealth: Payer: Self-pay | Admitting: Family Medicine

## 2015-04-09 VITALS — BP 100/74 | HR 68 | Ht 66.0 in | Wt 171.5 lb

## 2015-04-09 DIAGNOSIS — L03115 Cellulitis of right lower limb: Secondary | ICD-10-CM

## 2015-04-09 DIAGNOSIS — I481 Persistent atrial fibrillation: Secondary | ICD-10-CM

## 2015-04-09 DIAGNOSIS — I2510 Atherosclerotic heart disease of native coronary artery without angina pectoris: Secondary | ICD-10-CM | POA: Diagnosis not present

## 2015-04-09 DIAGNOSIS — Z7901 Long term (current) use of anticoagulants: Secondary | ICD-10-CM

## 2015-04-09 DIAGNOSIS — I4819 Other persistent atrial fibrillation: Secondary | ICD-10-CM

## 2015-04-09 DIAGNOSIS — Z5181 Encounter for therapeutic drug level monitoring: Secondary | ICD-10-CM

## 2015-04-09 DIAGNOSIS — I5042 Chronic combined systolic (congestive) and diastolic (congestive) heart failure: Secondary | ICD-10-CM

## 2015-04-09 MED ORDER — CIPROFLOXACIN HCL 250 MG PO TABS: 250.0000 mg | ORAL_TABLET | Freq: Two times a day (BID) | ORAL | Status: DC

## 2015-04-09 NOTE — Patient Instructions (Signed)
Increase lasix to 60 mg twice a day  Restrict salt intake.   Dr. Dennard Schaumann will follow up on coumadin dosing.   We will arrange follow in 4 weeks.

## 2015-04-09 NOTE — Telephone Encounter (Signed)
Pt came home from [facility] a few days ago.  Had been doing well.  More confused and lethargic.  Son believes it is another UTI.  Can you please advise??

## 2015-04-09 NOTE — Telephone Encounter (Signed)
Due to holiday and [facility] nurse not coming to house until Friday - WTP recommended to go ahead and start the antibx - [name] aware and med sent to [facility]

## 2015-04-09 NOTE — Telephone Encounter (Signed)
I would get UA and urine cx and start him on cipro 250 bid for 5 days while awaiting culture.

## 2015-04-09 NOTE — Telephone Encounter (Signed)
LMTCB

## 2015-04-09 NOTE — Progress Notes (Signed)
CARDIOLOGY OFFICE NOTE  Date:  04/09/2015    Jay Guzman Date of Birth: November 25, 1932 Medical Record [id number]  PCP:  Odette Fraction, MD  Cardiologist:    [place]  Chief Complaint  Patient presents with  . Follow-up    no chest pain,  has shortness of breath, has edema, has pain in legs, has cramping in legs, no lightheadedness, no dizziness    History of Present Illness: Jay Guzman is a 81 y.o. male who presents today for a follow up visit. He has a past medical history significant for dementia, COPD, hypertension, dyslipidemia and coronary artery disease with remote anterior MI. He had a catheterization in 2000 which showed an occluded LAD. He also has a history of apical thrombus and is on chronic or coumadin therapy for this. He is known to have chronic systolic congestive heart failure and moderate LV dysfunction with an EF of 45-50% with akinesis of the mid apical anteroseptal, anterior, inferior and apical myocardium on echo in April 2016. Chronic LV mural thrombus was noted and there is moderate pulmonary hypertension with an RVSP of 53 mmHg. He also has a history of atrial fibrillation which was recently diagnosed in September of 2016. Initial plans were to attempt direct current cardioversion after 4 weeks of therapeutic INRs with Coumadin. However since that time, he has required hospitalization.  He was last admitted to [facility] by internal medicine 02/22/15 -02/25/2015. He presented with shortness of breath and was found to have an acute COPD exacerbation. He was in atrial fibrillation but HR was well controlled. He was also noted to have minimally elevated troponin levels however this was felt to be due to demand ischemia. He denied any chest pain. There were no ischemic changes on his EKG. He did not undergo any ischemic workup. He was instructed to f/u in office for further management of his afib.   Comes back today. Here with his son. Sleepy but  arousable. Son notes that his feet remain swollen.Last 2 visits lasix increased from 20>>40>>60 mg.  Weight is up 2 lbs.  Not smoking. Some shortness of breath but not able to quantify. Son is thinks he has a lot of congestion in his chest. No improvement in edema. He was living at [facility] but moved back home yesterday. INRs have been subtherapeutic. To be followed by Dr. Dennard Schaumann.    Past Medical History  Diagnosis Date  . Coronary disease     WITH REMOTE ANTERIOR MYOCARDIAL INFARCTION  . Mural thrombus of heart (HCC)     CHRONIC  . Hypertension   . Hyperlipidemia   . CHF (congestive heart failure) ([place])   . Dementia   . A-fib ([place])   . COPD (chronic obstructive pulmonary disease) ([place])   . Shortness of breath dyspnea     Past Surgical History  Procedure Laterality Date  . Cardiac catheterization  2000  . Hernia repair      STATUS POST BILATERAL  . Abdominal aortic aneurysm repair  2008     Medications: Current Outpatient Prescriptions  Medication Sig Dispense Refill  . acetaminophen (TYLENOL) 325 MG tablet Take 650 mg by mouth every 6 (six) hours as needed (for pain).    Marland Kitchen albuterol (PROVENTIL) (2.5 MG/3ML) 0.083% nebulizer solution Take 2.5 mg by nebulization 3 (three) times daily.    . Asenapine Maleate (SAPHRIS) 10 MG SUBL Place 10 mg under the tongue 2 (two) times daily.    . bisacodyl (DULCOLAX) 10 MG suppository  Place 10 mg rectally as needed for moderate constipation.    . diclofenac sodium (VOLTAREN) 1 % GEL Apply 4 g topically 2 (two) times daily.     . digoxin (LANOXIN) 0.25 MG tablet Take 1 tablet (0.25 mg total) by mouth daily. 30 tablet 0  . docusate sodium (COLACE) 100 MG capsule Take 1 capsule (100 mg total) by mouth every 12 (twelve) hours. 60 capsule 0  . donepezil (ARICEPT) 5 MG tablet Take 5 mg by mouth at bedtime.     Marland Kitchen doxazosin (CARDURA) 2 MG tablet Take 2 mg by mouth daily.     Marland Kitchen ezetimibe-simvastatin (VYTORIN) 10-40 MG tablet Take 1 tablet by mouth  at bedtime.     . furosemide (LASIX) 20 MG tablet Take 60 mg by mouth 2 (two) times daily.    Marland Kitchen guaiFENesin (MUCINEX) 600 MG 12 hr tablet Take 2 tablets (1,200 mg total) by mouth 2 (two) times daily.    . magnesium hydroxide (MILK OF MAGNESIA) 400 MG/5ML suspension Take 30 mLs by mouth daily as needed for mild constipation.    . metoprolol succinate (TOPROL-XL) 50 MG 24 hr tablet Take 50 mg by mouth daily.     . mirtazapine (REMERON) 30 MG tablet Take 30 mg by mouth at bedtime.     . nitroGLYCERIN (NITROSTAT) 0.4 MG SL tablet Place 0.4 mg under the tongue every 5 (five) minutes as needed for chest pain.     Marland Kitchen ondansetron (ZOFRAN) 4 MG tablet Take 4 mg by mouth every 8 (eight) hours as needed for nausea or vomiting.    Marland Kitchen oxycodone (OXY-IR) 5 MG capsule Take 5 mg by mouth every 6 (six) hours as needed.    . OXYGEN Inhale 2 L into the lungs continuous.    Marland Kitchen saccharomyces boulardii (FLORASTOR) 250 MG capsule Take 250 mg by mouth 2 (two) times daily. Started 11/02 for 10 days    . SAPHRIS 5 MG SUBL 24 hr tablet Take 2 tablets by mouth daily. Take 1 tab daily  0  . Sodium Phosphates (RA SALINE ENEMA) 19-7 GM/118ML ENEM Place 1 each rectally as needed (for constipation).    . STIOLTO RESPIMAT 2.5-2.5 MCG/ACT AERS INHALE 2 PUFFS INTO THE LUNGS DAILY.  5  . traZODone (DESYREL) 50 MG tablet Take 25 mg by mouth at bedtime.    Marland Kitchen warfarin (COUMADIN) 6 MG tablet Take 7 mg by mouth daily. Take with 1 mg for a total of 7 mg daily    . zinc oxide (BALMEX) 11.3 % CREA cream Apply 1 application topically 2 (two) times daily. Apply to blanchable redness on buttocks after bathing    . ciprofloxacin (CIPRO) 250 MG tablet Take 1 tablet (250 mg total) by mouth 2 (two) times daily. 10 tablet 0   No current facility-administered medications for this visit.    Allergies: Allergies  Allergen Reactions  . Codeine Other (See Comments)    Reaction during surgical  Procedure-unknown  . Morphine And Related Other (See  Comments)    Reaction unknown-during surgical procedure    Social History: The patient  reports that he has quit smoking. His smoking use included Cigarettes. He has a 60 pack-year smoking history. He does not have any smokeless tobacco history on file. He reports that he does not drink alcohol or use illicit drugs.   Family History: The patient's  family history includes Cancer in his mother and sister; Cerebral aneurysm in his father; Heart attack in his father and mother;  Hypertension in his brother, father, mother, and sister; Stroke in his paternal grandfather and paternal grandmother.   Review of Systems: Please see the history of present illness.   Otherwise, the review of systems is positive for none.   All other systems are reviewed and negative.   Physical Exam: VS:  BP 100/74 mmHg  Pulse 68  Ht 5\' 6"  (1.676 m)  Wt 77.792 kg (171 lb 8 oz)  BMI 27.69 kg/m2 .  BMI Body mass index is 27.69 kg/(m^2).  Wt Readings from Last 3 Encounters:  04/09/15 77.792 kg (171 lb 8 oz)  03/14/15 77.384 kg (170 lb 9.6 oz)  03/11/15 77.565 kg (171 lb)    General: Sleeping. He is arousable. Chronically ill. He is in no acute distress. Speech is difficult to understand.  HEENT: Normal. Neck: Supple, no JVD, carotid bruits, or masses noted.  Cardiac: Irregular irregular rhythm. Rate is ok.  Heart tones are distant.  +2-3 edema below knees.   Respiratory:  Lungs have coarse rales in the bases GI: Soft and nontender.  MS: No deformity or atrophy. Gait and ROM intact. Skin: Warm and dry. Color is normal.  Neuro:  Strength and sensation are intact and no gross focal deficits noted.  Psych: Alert, appropriate and with normal affect.   LABORATORY DATA:  EKG:  EKG is not ordered today.  Lab Results  Component Value Date   WBC 7.5 03/25/2015   HGB 16.3 03/25/2015   HCT 48.0 03/25/2015   PLT 161 03/25/2015   GLUCOSE 92 03/25/2015   CHOL 124 01/01/2014   TRIG 75 01/01/2014   HDL 39*  01/01/2014   LDLCALC 70 01/01/2014   ALT 20 03/25/2015   AST 22 03/25/2015   NA 146* 03/25/2015   K 3.8 03/25/2015   CL 101 03/25/2015   CREATININE 1.20 03/25/2015   BUN 40* 03/25/2015   CO2 34* 03/25/2015   TSH 4.14 09/13/2013   PSA 0.89 01/01/2014   INR 1.72* 03/25/2015    BNP (last 3 results)  Recent Labs  08/26/14 1810 01/23/15 1943 02/22/15 0702  BNP 656.0* 1033.2* 690.8*    ProBNP (last 3 results) No results for input(s): PROBNP in the last 8760 hours.   Other Studies Reviewed Today:  Echo Study Conclusions from 08/2014  - Left ventricle: The cavity size was mildly dilated. Wall thickness was increased in a pattern of mild LVH. Systolic function was mildly reduced. The estimated ejection fraction was in the range of 45% to 50%. There is akinesis of the mid-apicalanteroseptal, anterior, inferior, and apical myocardium. Doppler parameters are consistent with abnormal left ventricular relaxation (grade 1 diastolic dysfunction). - Aortic valve: Mildly to moderately calcified annulus. Trileaflet; moderately calcified leaflets. There was mild regurgitation. - Mitral valve: Calcified annulus. There was mild regurgitation. - Left atrium: The atrium was moderately dilated. - Right atrium: The atrium was mildly dilated. Central venous pressure (est): 8 mm Hg. - Tricuspid valve: There was mild regurgitation. - Pulmonary arteries: PA peak pressure: 53 mm Hg (S). - Pericardium, extracardiac: There was no pericardial effusion.  Impressions:  - Mild LVH with LVEF approximately 45%, akinesis noted of the mid to distal anteroseptal, anterior, and inferior walls as well as apex consistent with ischemic cardiomyopathy. History of chronic LV mural thrombus described - not well visualized with this study (could consider microbubble contrast study if further assessment needed). Grade 1 diastolic dysfunction. MAC with mild mitral regurgitation.  Sclerotic aortic valve with mild aortic regurgitation. Mild tricuspid regurgitation with  PASP 53 mmHg.  Assessment/Plan:  1. Persistent Atrial Fibrillation: rate is controlled.  Has moderate LAE on prior echo which may impact success of restoring/maintaining NSR. I would not pursue trying to restore NSR. Coumadin has yet to be therapeutic and I doubt he would be able to maintain NSR. Family wants a more conservative approach. Will continue rate control and anticoagulation.   2. Chronic Combined Systolic + Diastolic CHF:  Still with edema.  Will increase his Lasix to 60 mg bid. Restrict sodium intake.  Will follow up in 4 weeks with BMET and BNP. May need to consider metolazone if edema does not respond.  3. COPD: s/p hospitalization for acute flare. Symptoms improved. Not smoking  4. CAD: stable without angina.   5. HTN: BP is controlled.   6. HLD: on Vytorin.   Current medicines are reviewed with the patient today.  The patient does not have concerns regarding medicines other than what has been noted above.  The following changes have been made:  See above.  Labs/ tests ordered today include:   No orders of the defined types were placed in this encounter.     Disposition:   FU 4 weeks.   Patient is agreeable to this plan and will call if any problems develop in the interim.   Signed: Aura Bibby [place] MD, [facility]    04/09/2015 6:52 PM

## 2015-04-14 ENCOUNTER — Telehealth: Payer: Self-pay | Admitting: Cardiology

## 2015-04-14 ENCOUNTER — Telehealth: Payer: Self-pay | Admitting: *Deleted

## 2015-04-14 ENCOUNTER — Other Ambulatory Visit: Payer: Self-pay | Admitting: Nurse Practitioner

## 2015-04-14 NOTE — Telephone Encounter (Signed)
Received call from [place], [place] with [facility],.   Reports that PT/INR was obtained on 04/11/2015 and noted as follows: PT: 32.8/ INR: 2.7  Patient currently taking Coumadin 7mg  PO QD.   MD please advise.   Can call Angela at [phone] with new orders.

## 2015-04-15 NOTE — Telephone Encounter (Signed)
Call placed to Jay Guzman, [facility] and SN was made aware.   Verbalized understanding.

## 2015-04-15 NOTE — Telephone Encounter (Signed)
inr therapeutic, continue coumadin dose, recheck in 4 weeks.

## 2015-04-16 ENCOUNTER — Telehealth: Payer: Self-pay | Admitting: Family Medicine

## 2015-04-17 NOTE — Telephone Encounter (Signed)
Received call 04/14/15 from [place] with [facility] calling to report INR.Advised to call PCP Dr.Pickard who follows INRs.

## 2015-04-21 ENCOUNTER — Other Ambulatory Visit: Payer: Self-pay | Admitting: Cardiology

## 2015-04-22 ENCOUNTER — Ambulatory Visit (INDEPENDENT_AMBULATORY_CARE_PROVIDER_SITE_OTHER): Payer: Medicare Other | Admitting: Family Medicine

## 2015-04-22 ENCOUNTER — Encounter: Payer: Self-pay | Admitting: Family Medicine

## 2015-04-22 VITALS — BP 138/70 | HR 80 | Temp 97.7°F | Resp 18 | Ht 69.0 in | Wt 166.0 lb

## 2015-04-22 DIAGNOSIS — I5022 Chronic systolic (congestive) heart failure: Secondary | ICD-10-CM | POA: Diagnosis not present

## 2015-04-22 DIAGNOSIS — F0391 Unspecified dementia with behavioral disturbance: Secondary | ICD-10-CM | POA: Diagnosis not present

## 2015-04-22 DIAGNOSIS — F03918 Unspecified dementia, unspecified severity, with other behavioral disturbance: Secondary | ICD-10-CM

## 2015-04-22 DIAGNOSIS — I482 Chronic atrial fibrillation, unspecified: Secondary | ICD-10-CM

## 2015-04-22 DIAGNOSIS — J449 Chronic obstructive pulmonary disease, unspecified: Secondary | ICD-10-CM

## 2015-04-22 LAB — CBC WITH DIFFERENTIAL/PLATELET
BASOS PCT: 1 % (ref 0–1)
Basophils Absolute: 0.1 10*3/uL (ref 0.0–0.1)
EOS ABS: 0.3 10*3/uL (ref 0.0–0.7)
Eosinophils Relative: 4 % (ref 0–5)
HCT: 40.2 % (ref 39.0–52.0)
HEMOGLOBIN: 13 g/dL (ref 13.0–17.0)
Lymphocytes Relative: 17 % (ref 12–46)
Lymphs Abs: 1.2 10*3/uL (ref 0.7–4.0)
MCH: 32.1 pg (ref 26.0–34.0)
MCHC: 32.3 g/dL (ref 30.0–36.0)
MCV: 99.3 fL (ref 78.0–100.0)
MONO ABS: 0.9 10*3/uL (ref 0.1–1.0)
MPV: 10.7 fL (ref 8.6–12.4)
Monocytes Relative: 13 % — ABNORMAL HIGH (ref 3–12)
NEUTROS ABS: 4.7 10*3/uL (ref 1.7–7.7)
NEUTROS PCT: 65 % (ref 43–77)
PLATELETS: 164 10*3/uL (ref 150–400)
RBC: 4.05 MIL/uL — AB (ref 4.22–5.81)
RDW: 15.1 % (ref 11.5–15.5)
WBC: 7.2 10*3/uL (ref 4.0–10.5)

## 2015-04-22 LAB — URINALYSIS, ROUTINE W REFLEX MICROSCOPIC
Bilirubin Urine: NEGATIVE
Glucose, UA: NEGATIVE
Hgb urine dipstick: NEGATIVE
Ketones, ur: NEGATIVE
NITRITE: NEGATIVE
PROTEIN: NEGATIVE
Specific Gravity, Urine: 1.015 (ref 1.001–1.035)
pH: 6.5 (ref 5.0–8.0)

## 2015-04-22 LAB — COMPLETE METABOLIC PANEL WITH GFR
ALBUMIN: 3.6 g/dL (ref 3.6–5.1)
ALK PHOS: 69 U/L (ref 40–115)
ALT: 13 U/L (ref 9–46)
AST: 16 U/L (ref 10–35)
BUN: 35 mg/dL — ABNORMAL HIGH (ref 7–25)
CO2: 33 mmol/L — ABNORMAL HIGH (ref 20–31)
Calcium: 8.2 mg/dL — ABNORMAL LOW (ref 8.6–10.3)
Chloride: 103 mmol/L (ref 98–110)
Creat: 1.21 mg/dL — ABNORMAL HIGH (ref 0.70–1.11)
GFR, EST NON AFRICAN AMERICAN: 56 mL/min — AB (ref >=60)
GFR, Est African American: 65 mL/min (ref >=60)
Glucose, Bld: 92 mg/dL (ref 70–99)
POTASSIUM: 3.4 mmol/L — AB (ref 3.5–5.3)
SODIUM: 146 mmol/L (ref 135–146)
Total Bilirubin: 1.7 mg/dL — ABNORMAL HIGH (ref 0.2–1.2)
Total Protein: 5.5 g/dL — ABNORMAL LOW (ref 6.1–8.1)

## 2015-04-22 LAB — URINALYSIS, MICROSCOPIC ONLY
CASTS: NONE SEEN [LPF]
Crystals: NONE SEEN [HPF]
RBC / HPF: NONE SEEN RBC/HPF (ref <=2)
Yeast: NONE SEEN [HPF]

## 2015-04-22 LAB — PT WITH INR/FINGERSTICK
INR FINGERSTICK: 4.8 — AB (ref 0.80–1.20)
PT FINGERSTICK: 57.4 s — AB (ref 10.4–12.5)

## 2015-04-22 MED ORDER — OXYCODONE HCL 5 MG PO CAPS: 5.0000 mg | ORAL_CAPSULE | Freq: Four times a day (QID) | ORAL | Status: DC | PRN

## 2015-04-22 MED ORDER — DICLOFENAC SODIUM 1 % TD GEL: 4.0000 g | Freq: Two times a day (BID) | TRANSDERMAL | Status: DC

## 2015-04-22 NOTE — Progress Notes (Signed)
Subjective:    Patient ID: Jay Guzman, male    DOB: December 29, 1932, 81 y.o.   MRN: [id number]  HPI In November, the patient was admitted to the hospital with a severe COPD exacerbation. He was then discharged to a nursing home for rehabilitation. While in the nursing home, he fell several times. At one point he had to go to the emergency room where imaging revealed a compression fracture of the L1 vertebrae which is being managed medically with oxycodone on an as-needed basis. He has now come home from the nursing home. He has end-stage dementia and is very confused. His son stays with him 58 7. Recently he has been extremely lethargic and somnolent. Today in the exam room he is sleeping. He is easily arousable but he sleeps constantly. Personally, he appears overmedicated. He is also been having more frequent nosebleeds recently. He is on continuous oxygen without a humidifier. He is also on Coumadin given his history of atrial fibrillation. An INR was checked today in the clinic and was found to be elevated at 4.8. It was just recently found to be slightly above 2 less than one week ago.  However he is also been on antibiotics recently for urinary tract infection. Past Medical History  Diagnosis Date  . Coronary disease     WITH REMOTE ANTERIOR MYOCARDIAL INFARCTION  . Mural thrombus of heart (HCC)     CHRONIC  . Hypertension   . Hyperlipidemia   . CHF (congestive heart failure) ([place])   . Dementia   . A-fib ([place])   . COPD (chronic obstructive pulmonary disease) ([place])   . Shortness of breath dyspnea    Past Surgical History  Procedure Laterality Date  . Cardiac catheterization  2000  . Hernia repair      STATUS POST BILATERAL  . Abdominal aortic aneurysm repair  2008   Current Outpatient Prescriptions on File Prior to Visit  Medication Sig Dispense Refill  . acetaminophen (TYLENOL) 325 MG tablet Take 650 mg by mouth every 6 (six) hours as needed (for pain).    Marland Kitchen albuterol  (PROVENTIL) (2.5 MG/3ML) 0.083% nebulizer solution Take 2.5 mg by nebulization 3 (three) times daily.    . Asenapine Maleate (SAPHRIS) 10 MG SUBL Place 10 mg under the tongue 2 (two) times daily.    . bisacodyl (DULCOLAX) 10 MG suppository Place 10 mg rectally as needed for moderate constipation.    . diclofenac sodium (VOLTAREN) 1 % GEL Apply 4 g topically 2 (two) times daily.     . digoxin (LANOXIN) 0.25 MG tablet Take 1 tablet (0.25 mg total) by mouth daily. 30 tablet 0  . docusate sodium (COLACE) 100 MG capsule Take 1 capsule (100 mg total) by mouth every 12 (twelve) hours. 60 capsule 0  . donepezil (ARICEPT) 5 MG tablet Take 5 mg by mouth at bedtime.     Marland Kitchen doxazosin (CARDURA) 2 MG tablet Take 2 mg by mouth daily.     Marland Kitchen ezetimibe-simvastatin (VYTORIN) 10-40 MG tablet Take 1 tablet by mouth at bedtime.     . furosemide (LASIX) 20 MG tablet Take 60 mg by mouth 2 (two) times daily.    Marland Kitchen guaiFENesin (MUCINEX) 600 MG 12 hr tablet Take 2 tablets (1,200 mg total) by mouth 2 (two) times daily.    . magnesium hydroxide (MILK OF MAGNESIA) 400 MG/5ML suspension Take 30 mLs by mouth daily as needed for mild constipation.    . metoprolol succinate (TOPROL-XL) 50 MG 24 hr  tablet Take 50 mg by mouth daily.     . mirtazapine (REMERON) 30 MG tablet Take 30 mg by mouth at bedtime.     . nitroGLYCERIN (NITROSTAT) 0.4 MG SL tablet Place 0.4 mg under the tongue every 5 (five) minutes as needed for chest pain.     Marland Kitchen ondansetron (ZOFRAN) 4 MG tablet Take 4 mg by mouth every 8 (eight) hours as needed for nausea or vomiting.    Marland Kitchen oxycodone (OXY-IR) 5 MG capsule Take 5 mg by mouth every 6 (six) hours as needed.    . OXYGEN Inhale 2 L into the lungs continuous.    Marland Kitchen saccharomyces boulardii (FLORASTOR) 250 MG capsule Take 250 mg by mouth 2 (two) times daily. Started 11/02 for 10 days    . SAPHRIS 5 MG SUBL 24 hr tablet Take 2 tablets by mouth daily. Take 1 tab daily  0  . Sodium Phosphates (RA SALINE ENEMA) 19-7  GM/118ML ENEM Place 1 each rectally as needed (for constipation).    . STIOLTO RESPIMAT 2.5-2.5 MCG/ACT AERS INHALE 2 PUFFS INTO THE LUNGS DAILY.  5  . traZODone (DESYREL) 50 MG tablet Take 25 mg by mouth at bedtime.    Marland Kitchen warfarin (COUMADIN) 6 MG tablet Take 7 mg by mouth daily. Take with 1 mg for a total of 7 mg daily    . zinc oxide (BALMEX) 11.3 % CREA cream Apply 1 application topically 2 (two) times daily. Apply to blanchable redness on buttocks after bathing     No current facility-administered medications on file prior to visit.   Allergies  Allergen Reactions  . Codeine Other (See Comments)    Reaction during surgical  Procedure-unknown  . Morphine And Related Other (See Comments)    Reaction unknown-during surgical procedure   Social History   Social History  . Marital Status: Married    Spouse Name: N/A  . Number of Children: 2  . 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 Smoking status: Former Smoker -- 1.00 packs/day for 60 years    Types: Cigarettes  . Smokeless tobacco: Not on file  . Alcohol Use: No  . Drug Use: No  . Sexual Activity: No   Other Topics Concern  . Not on file   Social History Narrative      Review of Systems  All other systems reviewed and are negative.      Objective:   Physical Exam  Constitutional: He is sleeping. He is easily aroused.  Cardiovascular: Normal rate, regular rhythm and normal heart sounds.   Pulmonary/Chest: Effort normal. He has wheezes. He has no rales.  Abdominal: Soft. Bowel sounds are normal. He exhibits no distension. There is no tenderness. There is no rebound and no guarding.  Musculoskeletal: He exhibits no edema.  Neurological: He is easily aroused.  Vitals reviewed.         Assessment & Plan:  Chronic obstructive pulmonary disease, unspecified COPD, unspecified chronic bronchitis type  Chronic systolic congestive heart failure  ([place])  Dementia with behavioral disturbance - Plan: CBC with Differential/Platelet, COMPLETE METABOLIC PANEL WITH GFR, Urinalysis, Routine w reflex microscopic (not at [facility])  Chronic atrial fibrillation ([place]) - Plan: PT with INR/Fingerstick  Patient appears overmedicated. I will decrease saphris to 5 mg pobid.  He is not taking trazodone.  Decrease the amount of oxycodone he takes on a daily basis.  Hold Coumadin temporarily. Recheck INR on  Friday. We will likely resume Coumadin at that time but at a much lower dose most likely 5 mg a day.  I will also send a urine cx to ro uti.  Continue stiolto bid for copd with tid albuterol nebs.  I will check a CMP. At present there is no peripheral edema. There is no crackles appreciated on pulmonary exam. However the patient appears slightly dehydrated. We may need to decrease the Lasix to 40 mg by mouth twice a day if his renal function is worsening.

## 2015-04-25 ENCOUNTER — Ambulatory Visit (INDEPENDENT_AMBULATORY_CARE_PROVIDER_SITE_OTHER): Payer: Medicare Other | Admitting: Family Medicine

## 2015-04-25 ENCOUNTER — Encounter: Payer: Self-pay | Admitting: Family Medicine

## 2015-04-25 ENCOUNTER — Other Ambulatory Visit: Payer: Self-pay | Admitting: Family Medicine

## 2015-04-25 VITALS — BP 90/58 | HR 76 | Temp 97.6°F | Resp 18

## 2015-04-25 DIAGNOSIS — F0391 Unspecified dementia with behavioral disturbance: Secondary | ICD-10-CM | POA: Diagnosis not present

## 2015-04-25 DIAGNOSIS — F03918 Unspecified dementia, unspecified severity, with other behavioral disturbance: Secondary | ICD-10-CM

## 2015-04-25 DIAGNOSIS — I482 Chronic atrial fibrillation, unspecified: Secondary | ICD-10-CM

## 2015-04-25 DIAGNOSIS — J441 Chronic obstructive pulmonary disease with (acute) exacerbation: Secondary | ICD-10-CM | POA: Diagnosis not present

## 2015-04-25 LAB — PT WITH INR/FINGERSTICK
INR, fingerstick: 1.6 — ABNORMAL HIGH (ref 0.80–1.20)
PT FINGERSTICK: 19.6 s — AB (ref 10.4–12.5)

## 2015-04-25 MED ORDER — DOXYCYCLINE HYCLATE 100 MG PO TABS: 100.0000 mg | ORAL_TABLET | Freq: Two times a day (BID) | ORAL | Status: DC

## 2015-04-25 MED ORDER — POTASSIUM CHLORIDE ER 10 MEQ PO TBCR: 10.0000 meq | EXTENDED_RELEASE_TABLET | Freq: Every day | ORAL | Status: DC

## 2015-04-25 NOTE — Progress Notes (Signed)
Subjective:    Patient ID: Jay Guzman, male    DOB: 07/03/1932, 81 y.o.   MRN: [id number]  HPI 04/22/15 In November, the patient was admitted to the hospital with a severe COPD exacerbation. He was then discharged to a nursing home for rehabilitation. While in the nursing home, he fell several times. At one point he had to go to the emergency room where imaging revealed a compression fracture of the L1 vertebrae which is being managed medically with oxycodone on an as-needed basis. He has now come home from the nursing home. He has end-stage dementia and is very confused. His son stays with him 41 7. Recently he has been extremely lethargic and somnolent. Today in the exam room he is sleeping. He is easily arousable but he sleeps constantly. Personally, he appears overmedicated. He is also been having more frequent nosebleeds recently. He is on continuous oxygen without a humidifier. He is also on Coumadin given his history of atrial fibrillation. An INR was checked today in the clinic and was found to be elevated at 4.8. It was just recently found to be slightly above 2 less than one week ago.  However he is also been on antibiotics recently for urinary tract infection.  At that time, my plan was: Patient appears overmedicated. I will decrease saphris to 5 mg pobid.  He is not taking trazodone.  Decrease the amount of oxycodone he takes on a daily basis.  Hold Coumadin temporarily. Recheck INR on Friday. We will likely resume Coumadin at that time but at a much lower dose most likely 5 mg a day.  I will also send a urine cx to ro uti.  Continue stiolto bid for copd with tid albuterol nebs.  I will check a CMP. At present there is no peripheral edema. There is no crackles appreciated on pulmonary exam. However the patient appears slightly dehydrated. We may need to decrease the Lasix to 40 mg by mouth twice a day if his renal function is worsening.  04/25/15 Office Visit on 04/22/2015  Component  Date Value Ref Range Status  . WBC 04/22/2015 7.2  4.0 - 10.5 K/uL Final  . RBC 04/22/2015 4.05* 4.22 - 5.81 MIL/uL Final  . Hemoglobin 04/22/2015 13.0  13.0 - 17.0 g/dL Final  . HCT 04/22/2015 40.2  39.0 - 52.0 % Final  . MCV 04/22/2015 99.3  78.0 - 100.0 fL Final  . MCH 04/22/2015 32.1  26.0 - 34.0 pg Final  . MCHC 04/22/2015 32.3  30.0 - 36.0 g/dL Final  . RDW 04/22/2015 15.1  11.5 - 15.5 % Final  . Platelets 04/22/2015 164  150 - 400 K/uL Final  . MPV 04/22/2015 10.7  8.6 - 12.4 fL Final  . Neutrophils Relative % 04/22/2015 65  43 - 77 % Final  . Neutro Abs 04/22/2015 4.7  1.7 - 7.7 K/uL Final  . Lymphocytes Relative 04/22/2015 17  12 - 46 % Final  . Lymphs Abs 04/22/2015 1.2  0.7 - 4.0 K/uL Final  . Monocytes Relative 04/22/2015 13* 3 - 12 % Final  . Monocytes Absolute 04/22/2015 0.9  0.1 - 1.0 K/uL Final  . Eosinophils Relative 04/22/2015 4  0 - 5 % Final  . Eosinophils Absolute 04/22/2015 0.3  0.0 - 0.7 K/uL Final  . Basophils Relative 04/22/2015 1  0 - 1 % Final  . Basophils Absolute 04/22/2015 0.1  0.0 - 0.1 K/uL Final  . Smear Review 04/22/2015 Criteria for review not met  Final  . Sodium 04/22/2015 146  135 - 146 mmol/L Final  . Potassium 04/22/2015 3.4* 3.5 - 5.3 mmol/L Final  . Chloride 04/22/2015 103  98 - 110 mmol/L Final  . CO2 04/22/2015 33* 20 - 31 mmol/L Final  . Glucose, Bld 04/22/2015 92  70 - 99 mg/dL Final  . BUN 04/22/2015 35* 7 - 25 mg/dL Final  . Creat 04/22/2015 1.21* 0.70 - 1.11 mg/dL Final  . Total Bilirubin 04/22/2015 1.7* 0.2 - 1.2 mg/dL Final  . Alkaline Phosphatase 04/22/2015 69  40 - 115 U/L Final  . AST 04/22/2015 16  10 - 35 U/L Final  . ALT 04/22/2015 13  9 - 46 U/L Final  . Total Protein 04/22/2015 5.5* 6.1 - 8.1 g/dL Final  . Albumin 04/22/2015 3.6  3.6 - 5.1 g/dL Final  . Calcium 04/22/2015 8.2* 8.6 - 10.3 mg/dL Final  . GFR, Est African American 04/22/2015 65  >=60 mL/min Final  . GFR, Est Non African American 04/22/2015 56* >=60 mL/min  Final   Comment:   The estimated GFR is a calculation valid for adults (>=79 years old) that uses the CKD-EPI algorithm to adjust for age and sex. It is   not to be used for children, pregnant women, hospitalized patients,    patients on dialysis, or with rapidly changing kidney function. According to the NKDEP, eGFR >89 is normal, 60-89 shows mild impairment, 30-59 shows moderate impairment, 15-29 shows severe impairment and <15 is ESRD.     Marland Kitchen Color, Urine 04/22/2015 YELLOW  YELLOW Final   Comment: ** Please note change in unit of measure and reference range(s). **     . APPearance 04/22/2015 CLEAR  CLEAR Final  . Specific Gravity, Urine 04/22/2015 1.015  1.001 - 1.035 Final  . pH 04/22/2015 6.5  5.0 - 8.0 Final  . Glucose, UA 04/22/2015 NEGATIVE  NEGATIVE Final  . Bilirubin Urine 04/22/2015 NEGATIVE  NEGATIVE Final  . Ketones, ur 04/22/2015 NEGATIVE  NEGATIVE Final  . Hgb urine dipstick 04/22/2015 NEGATIVE  NEGATIVE Final  . Protein, ur 04/22/2015 NEGATIVE  NEGATIVE Final  . Nitrite 04/22/2015 NEGATIVE  NEGATIVE Final  . Leukocytes, UA 04/22/2015 TRACE* NEGATIVE Final  . PT, fingerstick 04/22/2015 57.4* 10.4 - 12.5 seconds Final  . INR, fingerstick 04/22/2015 4.8* 0.80 - 1.20 Final   Comment: The INR is of principal utility in following patients on stable doses of oral anticoagulants. The therapeutic range is generally 2.0 to 3.0, but may be 3.0 to 4.0 in patients with mechanical cardiac valves, recurrent embolisms and antiphospholipid antibodies (including lupus inhibitors).   INRs >2.9 may be falsely elevated in patients receiving either unfractionated Heparin or Low Molecular Weight Heparin. Follow up testing in a hospital laboratory may be helpful if clinically indicated.    . WBC, UA 04/22/2015 0-5  <=5 WBC/HPF Final  . RBC / HPF 04/22/2015 NONE SEEN  <=2 RBC/HPF Final  . Squamous Epithelial / LPF 04/22/2015 0-5  <=5 HPF Final  . Bacteria, UA 04/22/2015 FEW* NONE SEEN  HPF Final  . Crystals 04/22/2015 NONE SEEN  NONE SEEN HPF Final  . Casts 04/22/2015 NONE SEEN  NONE SEEN LPF Final  . Yeast 04/22/2015 NONE SEEN  NONE SEEN HPF Final  . Urine-Other 04/22/2015 MUCUS SEEN   Final  Renal function was stable, so I have not reduced his dose of lasix.  He is here to recheck his INR.  There was no evidence of a UTI so I believe his somnolence  was truly due to overmedication.  Since decreasing saphris to 5 mg pobid.  Today his INR is 1.5. Son states that the patient is still going to sleep as soon as he takes the saphris, but wakes up after only 3-4 hours at night and then stays awake all night causing him to sleep more during the day.    Past Medical History  Diagnosis Date  . Coronary disease     WITH REMOTE ANTERIOR MYOCARDIAL INFARCTION  . [place] thrombus of heart (HCC)     CHRONIC  . Hypertension   . Hyperlipidemia   . CHF (congestive heart failure) ([place])   . Dementia   . A-fib ([place])   . COPD (chronic obstructive pulmonary disease) ([place])   . Shortness of breath dyspnea    Past Surgical History  Procedure Laterality Date  . Cardiac catheterization  2000  . Hernia repair      STATUS POST BILATERAL  . Abdominal aortic aneurysm repair  2008   Current Outpatient Prescriptions on File Prior to Visit  Medication Sig Dispense Refill  . acetaminophen (TYLENOL) 325 MG tablet Take 650 mg by mouth every 6 (six) hours as needed (for pain).    Marland Kitchen albuterol (PROVENTIL) (2.5 MG/3ML) 0.083% nebulizer solution Take 2.5 mg by nebulization 3 (three) times daily.    . Asenapine Maleate (SAPHRIS) 10 MG SUBL Place 10 mg under the tongue 2 (two) times daily.    . bisacodyl (DULCOLAX) 10 MG suppository Place 10 mg rectally as needed for moderate constipation.    . diclofenac sodium (VOLTAREN) 1 % GEL Apply 4 g topically 2 (two) times daily. 100 g 5  . digoxin (LANOXIN) 0.25 MG tablet Take 1 tablet (0.25 mg total) by mouth daily. 30 tablet 0  . docusate sodium (COLACE) 100 MG  capsule Take 1 capsule (100 mg total) by mouth every 12 (twelve) hours. 60 capsule 0  . donepezil (ARICEPT) 5 MG tablet Take 5 mg by mouth at bedtime.     Marland Kitchen doxazosin (CARDURA) 2 MG tablet Take 2 mg by mouth daily.     Marland Kitchen ezetimibe-simvastatin (VYTORIN) 10-40 MG tablet Take 1 tablet by mouth at bedtime.     . furosemide (LASIX) 20 MG tablet Take 60 mg by mouth 2 (two) times daily.    Marland Kitchen guaiFENesin (MUCINEX) 600 MG 12 hr tablet Take 2 tablets (1,200 mg total) by mouth 2 (two) times daily.    . magnesium hydroxide (MILK OF MAGNESIA) 400 MG/5ML suspension Take 30 mLs by mouth daily as needed for mild constipation.    . metoprolol succinate (TOPROL-XL) 50 MG 24 hr tablet Take 50 mg by mouth daily.     . mirtazapine (REMERON) 30 MG tablet Take 30 mg by mouth at bedtime.     . nitroGLYCERIN (NITROSTAT) 0.4 MG SL tablet Place 0.4 mg under the tongue every 5 (five) minutes as needed for chest pain.     Marland Kitchen ondansetron (ZOFRAN) 4 MG tablet Take 4 mg by mouth every 8 (eight) hours as needed for nausea or vomiting.    Marland Kitchen oxycodone (OXY-IR) 5 MG capsule Take 1 capsule (5 mg total) by mouth every 6 (six) hours as needed. 60 capsule 0  . OXYGEN Inhale 2 L into the lungs continuous.    Marland Kitchen saccharomyces boulardii (FLORASTOR) 250 MG capsule Take 250 mg by mouth 2 (two) times daily. Started 11/02 for 10 days    . SAPHRIS 5 MG SUBL 24 hr tablet Take 2 tablets by mouth  daily. Take 1 tab daily  0  . Sodium Phosphates (RA SALINE ENEMA) 19-7 GM/118ML ENEM Place 1 each rectally as needed (for constipation).    . STIOLTO RESPIMAT 2.5-2.5 MCG/ACT AERS INHALE 2 PUFFS INTO THE LUNGS DAILY.  5  . traZODone (DESYREL) 50 MG tablet Take 25 mg by mouth at bedtime.    Marland Kitchen warfarin (COUMADIN) 6 MG tablet Take 7 mg by mouth daily. Take with 1 mg for a total of 7 mg daily    . zinc oxide (BALMEX) 11.3 % CREA cream Apply 1 application topically 2 (two) times daily. Apply to blanchable redness on buttocks after bathing     No current  facility-administered medications on file prior to visit.   Allergies  Allergen Reactions  . Codeine Other (See Comments)    Reaction during surgical  Procedure-unknown  . Morphine And Related Other (See Comments)    Reaction unknown-during surgical procedure   Social History   Social History  . Marital Status: Married    Spouse Name: N/A  . Number of Children: 2  . 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 Smoking status: Former Smoker -- 1.00 packs/day for 60 years    Types: Cigarettes  . Smokeless tobacco: Not on file  . Alcohol Use: No  . Drug Use: No  . Sexual Activity: No   Other Topics Concern  . Not on file   Social History Narrative      Review of Systems  All other systems reviewed and are negative.      Objective:   Physical Exam  Constitutional: He is sleeping. He is easily aroused.  Cardiovascular: Normal rate, regular rhythm and normal heart sounds.   Pulmonary/Chest: Effort normal. He has wheezes. He has rhonchi. He has no rales.  Abdominal: Soft. Bowel sounds are normal. He exhibits no distension. There is no tenderness. There is no rebound and no guarding.  Musculoskeletal: He exhibits no edema.  Neurological: He is easily aroused.  Vitals reviewed.         Assessment & Plan:  Chronic atrial fibrillation ([place]) - Plan: PT with INR/Fingerstick, PT with INR/Fingerstick  Dementia with behavioral disturbance  COPD exacerbation ([place]) - Plan: doxycycline (VIBRA-TABS) 100 MG tablet  Resume Coumadin 5 mg by mouth daily and recheck INR in one week. Change saphris to 10 mg at night and none during the day. Hopefully this will keep him sleeping more throughout the night and then allow him to be more alert during the day. I believe if we can regulate his circadian rhythm/sleep-wake cycle better it will help alleviate some of his confusion and hypersomnolence during the daytime. He also has  significant congestions and his lungs today along with wheezing. I am concerned he is starting to develop a COPD exacerbation. Therefore I will start the patient on doxycycline 100 mg by mouth twice a day for 7 days

## 2015-04-28 ENCOUNTER — Telehealth: Payer: Self-pay | Admitting: Family Medicine

## 2015-04-28 ENCOUNTER — Other Ambulatory Visit: Payer: Self-pay | Admitting: Cardiology

## 2015-04-28 MED ORDER — SAPHRIS 5 MG SL SUBL: SUBLINGUAL_TABLET | SUBLINGUAL | Status: DC

## 2015-04-28 NOTE — Telephone Encounter (Signed)
If not too sedated could do 5 in the day and 10 at night.

## 2015-04-28 NOTE — Telephone Encounter (Signed)
PA Submitted through [email or web address] for Voltaren Gel   DX: D/T coumadin use no other meds are indicated

## 2015-04-28 NOTE — Telephone Encounter (Signed)
Son made aware of dose change. New Rx to pharmacy.

## 2015-04-28 NOTE — Telephone Encounter (Signed)
°*  STAT* If patient is at the pharmacy, call can be transferred to refill team.   1. Which medications need to be refilled? (please list name of each medication and dose if known) Furosemide-until his mail order comes  2. Which pharmacy/location (including street and city if local pharmacy) is medication to be sent to?[id number]  3. Do they need a 30 day or 90 day supply? [place]

## 2015-04-28 NOTE — Telephone Encounter (Signed)
Not tolerating the Saphris 10 mg at night only.  Still very anxious and fidgety all during the day and evening.  Does sleep well during the night but is "a handful" during the day.  Has had to give him another 5 mg during the day.  Please advise?

## 2015-04-29 MED ORDER — DICLOFENAC SODIUM 1 % TD GEL: 4.0000 g | Freq: Two times a day (BID) | TRANSDERMAL | Status: AC

## 2015-04-29 MED ORDER — FUROSEMIDE 20 MG PO TABS: 60.0000 mg | ORAL_TABLET | Freq: Two times a day (BID) | ORAL | Status: DC

## 2015-04-29 NOTE — Telephone Encounter (Signed)
Approved through 04/27/16 Pharmacy aware

## 2015-04-29 NOTE — Telephone Encounter (Signed)
Rx(s) sent to pharmacy electronically.  

## 2015-04-30 ENCOUNTER — Other Ambulatory Visit: Payer: Medicare Other

## 2015-04-30 DIAGNOSIS — R410 Disorientation, unspecified: Secondary | ICD-10-CM

## 2015-04-30 LAB — URINALYSIS, MICROSCOPIC ONLY
CASTS: NONE SEEN [LPF]
CRYSTALS: NONE SEEN [HPF]
YEAST: NONE SEEN [HPF]

## 2015-04-30 LAB — URINALYSIS, ROUTINE W REFLEX MICROSCOPIC
BILIRUBIN URINE: NEGATIVE
GLUCOSE, UA: NEGATIVE
HGB URINE DIPSTICK: NEGATIVE
KETONES UR: NEGATIVE
NITRITE: NEGATIVE
PH: 6.5 (ref 5.0–8.0)
Protein, ur: NEGATIVE
SPECIFIC GRAVITY, URINE: 1.02 (ref 1.001–1.035)

## 2015-05-01 ENCOUNTER — Other Ambulatory Visit: Payer: Self-pay | Admitting: Family Medicine

## 2015-05-01 ENCOUNTER — Telehealth: Payer: Self-pay | Admitting: *Deleted

## 2015-05-01 MED ORDER — CIPROFLOXACIN HCL 250 MG PO TABS: 250.0000 mg | ORAL_TABLET | Freq: Two times a day (BID) | ORAL | Status: DC

## 2015-05-01 MED ORDER — ASENAPINE MALEATE 5 MG SL SUBL: SUBLINGUAL_TABLET | SUBLINGUAL | Status: DC

## 2015-05-01 NOTE — Telephone Encounter (Signed)
Received request from pharmacy for [place] on Saphris.  PA submitted.   Dx: F03.90- dementia

## 2015-05-02 LAB — URINE CULTURE
COLONY COUNT: NO GROWTH
Organism ID, Bacteria: NO GROWTH

## 2015-05-02 MED ORDER — RISPERIDONE 0.5 MG PO TABS: 0.5000 mg | ORAL_TABLET | Freq: Two times a day (BID) | ORAL | Status: DC

## 2015-05-02 NOTE — Telephone Encounter (Signed)
Received PA determination.   PA denied.   Dx not covered.   Saphris labeled to treat Bipolar.

## 2015-05-02 NOTE — Telephone Encounter (Signed)
If they will not cover saphris, switch to risperdal 0.5 mg pobid.

## 2015-05-02 NOTE — Telephone Encounter (Signed)
Med sent to pharm and pt's son aware

## 2015-05-05 ENCOUNTER — Other Ambulatory Visit: Payer: Self-pay | Admitting: Family Medicine

## 2015-05-05 MED ORDER — TRAZODONE HCL 50 MG PO TABS: 25.0000 mg | ORAL_TABLET | Freq: Every day | ORAL | Status: AC

## 2015-05-05 MED ORDER — DIGOXIN 250 MCG PO TABS: 0.2500 mg | ORAL_TABLET | Freq: Every day | ORAL | Status: DC

## 2015-05-05 NOTE — Telephone Encounter (Signed)
Received a call from patient's son Tharon Aquas.He stated he was calling to verify lasix dose.Advised he should be taking lasix 60 mg twice a day.Advised to keep appointment with Rosaria Ferries PA 05/08/15 at 1:30 pm.

## 2015-05-08 ENCOUNTER — Encounter: Payer: Self-pay | Admitting: Physician Assistant

## 2015-05-08 ENCOUNTER — Telehealth: Payer: Self-pay | Admitting: *Deleted

## 2015-05-08 ENCOUNTER — Ambulatory Visit (INDEPENDENT_AMBULATORY_CARE_PROVIDER_SITE_OTHER): Payer: Medicare Other | Admitting: Physician Assistant

## 2015-05-08 VITALS — BP 112/58 | HR 68 | Ht 66.0 in | Wt 166.5 lb

## 2015-05-08 DIAGNOSIS — I481 Persistent atrial fibrillation: Secondary | ICD-10-CM

## 2015-05-08 DIAGNOSIS — I5043 Acute on chronic combined systolic (congestive) and diastolic (congestive) heart failure: Secondary | ICD-10-CM

## 2015-05-08 DIAGNOSIS — R05 Cough: Secondary | ICD-10-CM

## 2015-05-08 DIAGNOSIS — R059 Cough, unspecified: Secondary | ICD-10-CM

## 2015-05-08 DIAGNOSIS — I4819 Other persistent atrial fibrillation: Secondary | ICD-10-CM

## 2015-05-08 DIAGNOSIS — R0602 Shortness of breath: Secondary | ICD-10-CM | POA: Diagnosis not present

## 2015-05-08 DIAGNOSIS — Z79899 Other long term (current) drug therapy: Secondary | ICD-10-CM

## 2015-05-08 LAB — BASIC METABOLIC PANEL
BUN: 26 mg/dL — ABNORMAL HIGH (ref 7–25)
CHLORIDE: 100 mmol/L (ref 98–110)
CO2: 32 mmol/L — AB (ref 20–31)
CREATININE: 1.61 mg/dL — AB (ref 0.70–1.11)
Calcium: 8.7 mg/dL (ref 8.6–10.3)
Glucose, Bld: 91 mg/dL (ref 65–99)
POTASSIUM: 4.5 mmol/L (ref 3.5–5.3)
Sodium: 140 mmol/L (ref 135–146)

## 2015-05-08 NOTE — Progress Notes (Signed)
Cardiology Office Note   Date:  05/08/2015   ID:  Jay Guzman, DOB 06-May-1933, MRN [id number]  PCP:  Odette Fraction, MD  Cardiologist:  Dr Martinique  Dagoberto Nealy, PA-C   Chief Complaint  Patient presents with  . Follow-up    4 wk//pt c/o SOB and occasional dizziness, swelling has improved//pt has a bump on lower left arm thst is red and sore, pt fell against a door frame Monday, bump has developed over the last 2 days.    History of Present Illness: Jay Guzman is a 81 y.o. male with a history of CAD with a known occluded LAD and ischemic cardiomyopathy with an EF of 45-50 percent by echo in April 2016. He has a chronic LV mural thrombus. PAF on Coumadin. History of COPD, dementia, hypertension and dyslipidemia  Seen 11/23 for SOB, Lasix increased  [name] presents for follow-up of his heart failure.  Since being started on the higher dose of Lasix, he has urinated frequently, his lower extremity edema has improved and his weight has decreased. The son thinks his weight has gone down by about 5 pounds. He has chronic dyspnea on exertion but this is improved. However, he is still coughing in the son describes significant orthopnea which has not improved. The cough is nonproductive. There have been no fevers or chills. He wheezes at times but this is generally well-controlled by nebulizers 3 times a day and an inhaler. He has not had to use the rescue inhaler since he went home from the hospital.  His sons are concerned because he seems sedated much of the time. However, when he was taken off his Saphris, he developed agitation and was very angry. He has since been started on respiratory but that also seems to be making him sleepy. However, he is cooperative and arouses easily to verbal stimulus. He answers appropriately.  He fell against a door frame on Monday and got a lump on his arm. The pump is sore. It is a white raised 0.5 cm area that does not appear  fluctuant.   Past Medical History  Diagnosis Date  . Coronary disease     WITH REMOTE ANTERIOR MYOCARDIAL INFARCTION  . Mural thrombus of heart (HCC)     CHRONIC  . Hypertension   . Hyperlipidemia   . CHF (congestive heart failure) ([place])   . Dementia   . A-fib ([place])   . COPD (chronic obstructive pulmonary disease) ([place])   . Shortness of breath dyspnea     Past Surgical History  Procedure Laterality Date  . Cardiac catheterization  2000  . Hernia repair      STATUS POST BILATERAL  . Abdominal aortic aneurysm repair  2008    Current Outpatient Prescriptions  Medication Sig Dispense Refill  . albuterol (PROVENTIL) (2.5 MG/3ML) 0.083% nebulizer solution Take 2.5 mg by nebulization 3 (three) times daily.    . bisacodyl (DULCOLAX) 10 MG suppository Place 10 mg rectally as needed for moderate constipation.    . diclofenac sodium (VOLTAREN) 1 % GEL Apply 4 g topically 2 (two) times daily. 100 g 5  . digoxin (LANOXIN) 0.25 MG tablet Take 1 tablet (0.25 mg total) by mouth daily. 30 tablet 5  . docusate sodium (COLACE) 100 MG capsule Take 1 capsule (100 mg total) by mouth every 12 (twelve) hours. 60 capsule 0  . donepezil (ARICEPT) 5 MG tablet Take 5 mg by mouth at bedtime.     Marland Kitchen doxazosin (CARDURA)  2 MG tablet Take 2 mg by mouth daily.     Marland Kitchen ezetimibe-simvastatin (VYTORIN) 10-40 MG tablet Take 1 tablet by mouth at bedtime.     . furosemide (LASIX) 20 MG tablet Take 3 tablets (60 mg total) by mouth 2 (two) times daily. 90 tablet 0  . guaiFENesin (MUCINEX) 600 MG 12 hr tablet Take 2 tablets (1,200 mg total) by mouth 2 (two) times daily.    . metoprolol succinate (TOPROL-XL) 50 MG 24 hr tablet Take 50 mg by mouth daily.     . mirtazapine (REMERON) 30 MG tablet Take 30 mg by mouth at bedtime.     . nitroGLYCERIN (NITROSTAT) 0.4 MG SL tablet Place 0.4 mg under the tongue every 5 (five) minutes as needed for chest pain.     Marland Kitchen ondansetron (ZOFRAN) 4 MG tablet Take 4 mg by mouth every 8  (eight) hours as needed for nausea or vomiting.    . OXYGEN Inhale 2 L into the lungs continuous.    . potassium chloride (K-DUR) 10 MEQ tablet Take 1 tablet (10 mEq total) by mouth daily. 30 tablet 5  . risperiDONE (RISPERDAL) 0.5 MG tablet Take 1 tablet (0.5 mg total) by mouth 2 (two) times daily. 60 tablet 3  . STIOLTO RESPIMAT 2.5-2.5 MCG/ACT AERS INHALE 2 PUFFS INTO THE LUNGS DAILY.  5  . traZODone (DESYREL) 50 MG tablet Take 0.5 tablets (25 mg total) by mouth at bedtime. 30 tablet 2  . warfarin (COUMADIN) 5 MG tablet Take 5 mg by mouth daily.     No current facility-administered medications for this visit.    Allergies:   Codeine and Morphine and related    Social History:  The patient  reports that he has quit smoking. His smoking use included Cigarettes. He has a 60 pack-year smoking history. He does not have any smokeless tobacco history on file. He reports that he does not drink alcohol or use illicit drugs.   Family History:  The patient's family history includes Cancer in his mother and sister; Cerebral aneurysm in his father; Heart attack in his father and mother; Hypertension in his brother, father, mother, and sister; Stroke in his paternal grandfather and paternal grandmother.    ROS:  Please see the history of present illness. All other systems are reviewed and negative.    PHYSICAL EXAM: VS:  BP 112/58 mmHg  Pulse 68  Ht 5\' 6"  (1.676 m)  Wt 166 lb 8 oz (75.524 kg)  BMI 26.89 kg/m2 , BMI Body mass index is 26.89 kg/(m^2). GEN: Well nourished, well developed, male in no acute distress HEENT: normal for age  Neck: JVD 8 cm, no carotid bruit, no masses Cardiac: Irregular rate and rhythm; no murmur, no rubs, or gallops Respiratory: Rales bilateral bases, right greater than left, normal work of breathing GI: soft, nontender, nondistended, + BS MS: no deformity or atrophy; no edema; distal pulses are 2+ in all 4 extremities;  0.5 cm white lesion noted on left forearm. It  is tender to palpation but there is no gross deformity to the bones. No crepitus is noted.  Skin: warm and dry, no rash; multiple areas of ecchymosis are noted. Neuro:  Strength and sensation are intact Psych: euthymic mood, full affect   EKG:  EKG is not ordered today.  Recent Labs: 02/22/2015: B Natriuretic Peptide 690.8* 02/24/2015: Magnesium 2.2 04/22/2015: ALT 13; BUN 35*; Creat 1.21*; Hemoglobin 13.0; Platelets 164; Potassium 3.4*; Sodium 146    Lipid Panel  Component Value Date/Time   CHOL 124 01/01/2014 0816   TRIG 75 01/01/2014 0816   HDL 39* 01/01/2014 0816   CHOLHDL 3.2 01/01/2014 0816   VLDL 15 01/01/2014 0816   LDLCALC 70 01/01/2014 0816     Wt Readings from Last 3 Encounters:  05/08/15 166 lb 8 oz (75.524 kg)  04/22/15 166 lb (75.297 kg)  04/09/15 171 lb 8 oz (77.792 kg)     Other studies Reviewed: Additional studies/ records that were reviewed today include: Previous office notes and hospital records.  ASSESSMENT AND PLAN:  1.  Acute on chronic systolic and diastolic CHF: Dr. Martinique increased to Lasix at his last office visit. His lower extremity edema has improved and according to the sons he is doing better. However, there is still significant orthopnea and cough. We will check a chest x-ray. Once the chest x-ray results are reviewed, decide on medication changes. We will also check a basic metabolic panel; we may need to reduce his Lasix dose depending on his renal function.  2. Persistent atrial fibrillation: Since he has been on increased dose of Lasix, we will check a digoxin level. His rate is controlled.  Current medicines are reviewed at length with the patient today.  The patient does not have concerns regarding medicines.  The following changes have been made:  no change at this time  Labs/ tests ordered today include:  Orders Placed This Encounter  Procedures  . DG Chest 2 View  . Basic metabolic panel  . Digoxin level     Disposition:    FU with Dr. Martinique  Signed, Lenoard Aden  05/08/2015 2:47 PM    [address], [place], [place]  [address] Phone: [phone]; Fax: [phone]

## 2015-05-08 NOTE — Telephone Encounter (Signed)
Received request from pharmacy for PA on Digoxin.   PA submitted.   Dx: I48.1 (AFib)/ I50.43.   Rationale: Digoxin remains the most widely used cardiac glycoside despite the emergence of newer agents because it has few side effects when appropriate dosing is guided by serum concentrations and because it does not increase long term mortality. Despite its narrow therapeutic index and potential fatal toxicity, particularly in the elderly, digoxin is widely used in the management of congestive heart failure and arrhythmia. Given the incidence and prevalence of heart failure and arrhythmia in the elderly (80%), digoxin is one of the most frequently prescribed medications in the over 56 year population.  Jay Guzman has been treated for Atrial Fibrillation since September 2016. He started on Coumadin therapy, but this was found to be an ineffective treatment. He was then started on Digoxin and Metoprolol in addition to the Coumadin. His arrhythmia has been effectively controlled on this treatment plan.  Jay Guzman complies with his treatment plan and the high risks associated with Digoxin are easily mitigated by frequent follow up visits, routine blood serum monitoring, and symptom management. Changing the treatment plan of Jay Guzman at this time could be detrimental to his health in the aspect of uncontrolled A-Fib.

## 2015-05-08 NOTE — Patient Instructions (Signed)
Medication Instructions:  Please continue current medications.  Labwork: Your physician recommends that you return for lab work TODAY.  Testing/Procedures: 1. Chest XRAY - A chest x-ray takes a picture of the organs and structures inside the chest, including the heart, lungs, and blood vessels. This test can show several things, including, whether the heart is enlarges; whether fluid is building up in the lungs; and whether pacemaker / defibrillator leads are still in place.  Follow-Up: Rosaria Ferries, PA-C recommends that you schedule a follow-up appointment First available with Dr. Martinique.  If you need a refill on your cardiac medications before your next appointment, please call your pharmacy.

## 2015-05-09 ENCOUNTER — Ambulatory Visit
Admission: RE | Admit: 2015-05-09 | Discharge: 2015-05-09 | Disposition: A | Discharge status: Home / Self Care | Payer: Medicare Other | Source: Ambulatory Visit | Attending: Physician Assistant | Admitting: Physician Assistant

## 2015-05-09 ENCOUNTER — Telehealth: Payer: Self-pay | Admitting: Physician Assistant

## 2015-05-09 DIAGNOSIS — R0602 Shortness of breath: Secondary | ICD-10-CM

## 2015-05-09 DIAGNOSIS — I5043 Acute on chronic combined systolic (congestive) and diastolic (congestive) heart failure: Secondary | ICD-10-CM

## 2015-05-09 DIAGNOSIS — R05 Cough: Secondary | ICD-10-CM

## 2015-05-09 DIAGNOSIS — R059 Cough, unspecified: Secondary | ICD-10-CM

## 2015-05-09 LAB — DIGOXIN LEVEL: Digoxin Level: 1.5 ug/L (ref 0.8–2.0)

## 2015-05-09 NOTE — Telephone Encounter (Signed)
Pt is supposed to get chest xray,when was he supposed to get it? Please also call,concerning his fluid.

## 2015-05-09 NOTE — Telephone Encounter (Signed)
Spoke with pt son, aware cxr was to be done yesterday. They are currently on the way to have done. Aware any medication changes will be based on the results of the cxr.

## 2015-05-13 ENCOUNTER — Telehealth: Payer: Self-pay | Admitting: *Deleted

## 2015-05-13 ENCOUNTER — Telehealth: Payer: Self-pay | Admitting: Family Medicine

## 2015-05-13 ENCOUNTER — Telehealth: Payer: Self-pay | Admitting: Cardiology

## 2015-05-13 DIAGNOSIS — I10 Essential (primary) hypertension: Secondary | ICD-10-CM

## 2015-05-13 NOTE — Telephone Encounter (Signed)
Received PA determination.   PA denied.   Required adequate monitoring plan for adverse reactions.   Appeal faxed.

## 2015-05-13 NOTE — Telephone Encounter (Signed)
Call placed to Theo Dills SN with [facility].   VO given.

## 2015-05-13 NOTE — Telephone Encounter (Signed)
Reviewed Chest xray results with son who has authority to receive information Son asked if he still needs to be on lasix 60 mg twice a day; edema is gone Noted Creatinine of 5 days ago is up to 1.61 from 1.21 three weeks ago Has been on mucinex since October, has a loose cough but not bringing anything up Instructed to call his father's PCP to see if he would like to get him something different for his cough Is taking all medications including neb treatments as ordered

## 2015-05-13 NOTE — Telephone Encounter (Signed)
Pt 's son called in stating that the pt had a chest x-ray on 12/23 and the son would like to know what the results revealed because , he feels that the pt may have pneumonia. Please f/u with son   Thanks

## 2015-05-13 NOTE — Telephone Encounter (Signed)
Received call from [place], Kevil with [facility] [phone]~ cell phone  Reports that patient fingerstick INR on 05/09/2015 noted at 1.9, and current Coumadin dose is 5mg  PO QD.   MD to be made aware.

## 2015-05-13 NOTE — Telephone Encounter (Signed)
He should continue current lasix. CXR shows no pneumonia.  Peter Martinique MD, [facility]

## 2015-05-13 NOTE — Telephone Encounter (Signed)
Patients son calling to speak to you regarding his current sickness  And congestion  [phone]

## 2015-05-13 NOTE — Telephone Encounter (Signed)
No change. Recheck in 4 weeks.

## 2015-05-13 NOTE — Telephone Encounter (Signed)
Instructed son to keep his father on the same dose of Lasix

## 2015-05-14 ENCOUNTER — Other Ambulatory Visit: Payer: Self-pay | Admitting: Cardiology

## 2015-05-14 MED ORDER — EZETIMIBE-SIMVASTATIN 10-40 MG PO TABS: 1.0000 | ORAL_TABLET | Freq: Every day | ORAL | Status: AC

## 2015-05-14 MED ORDER — RISPERIDONE 0.5 MG PO TABS: 0.5000 mg | ORAL_TABLET | Freq: Two times a day (BID) | ORAL | Status: AC

## 2015-05-14 MED ORDER — FUROSEMIDE 20 MG PO TABS: 60.0000 mg | ORAL_TABLET | Freq: Two times a day (BID) | ORAL | Status: DC

## 2015-05-14 MED ORDER — DONEPEZIL HCL 5 MG PO TABS: 5.0000 mg | ORAL_TABLET | Freq: Every day | ORAL | Status: AC

## 2015-05-14 MED ORDER — POTASSIUM CHLORIDE ER 10 MEQ PO TBCR: 10.0000 meq | EXTENDED_RELEASE_TABLET | Freq: Every day | ORAL | Status: AC

## 2015-05-14 MED ORDER — MIRTAZAPINE 30 MG PO TABS: 30.0000 mg | ORAL_TABLET | Freq: Every day | ORAL | Status: AC

## 2015-05-14 MED ORDER — DOXAZOSIN MESYLATE 2 MG PO TABS: 2.0000 mg | ORAL_TABLET | Freq: Every day | ORAL | Status: AC

## 2015-05-14 MED ORDER — WARFARIN SODIUM 5 MG PO TABS: 5.0000 mg | ORAL_TABLET | Freq: Every day | ORAL | Status: AC

## 2015-05-14 MED ORDER — DIGOXIN 250 MCG PO TABS: 0.2500 mg | ORAL_TABLET | Freq: Every day | ORAL | Status: AC

## 2015-05-14 MED ORDER — METOPROLOL SUCCINATE ER 50 MG PO TB24: 50.0000 mg | ORAL_TABLET | Freq: Every day | ORAL | Status: DC

## 2015-05-14 NOTE — Telephone Encounter (Signed)
Regarding the pain med--I did read last note at Cardiology--included patient having sedation, sleepy and that he had recently fallen into the door frame. Regarding the cough---NTBS---he has h/o COPD, Cardiac issues, etc-- NTBS.  Needs OV with Dr. Dennard Schaumann to evaluate both of these issues.

## 2015-05-14 NOTE — Telephone Encounter (Signed)
Pt's son states that pt has a cough and the medication is not helping him with his cough. He is taking Waltussin - generic guaifenesin and dextromethorphan AND mucinex 1200mg  bid. His son states that he is not coughing up anything it is just a dry cough. He did have CXR done the other day that only showed COPD but nothing else. He wanted to know what he could give for his cough? I explained to him that the mucinex would not help him with the COPD cough but he could use a cough suppressant with out the guaifenesin but not until after I talked with a provider? Any recommendations?

## 2015-05-14 NOTE — Telephone Encounter (Signed)
The medicine he thought he needs,did not come from our doctor.

## 2015-05-14 NOTE — Telephone Encounter (Signed)
Also needs a refill of his oxy - not sure why cards took off list but he takes it, he is due for it and he is out - OK to refill?

## 2015-05-14 NOTE — Telephone Encounter (Signed)
°*  STAT* If patient is at the pharmacy, call can be transferred to refill team.   1. Which medications need to be refilled? (please list name of each medication and dose if known) Furosemide  2. Which pharmacy/location (including street and city if local pharmacy) is medication to be sent to?[phone]  3. Do they need a 30 day or 90 day supply? 90 and refills

## 2015-05-15 NOTE — Telephone Encounter (Signed)
Pt still needs his Furosemide.

## 2015-05-15 NOTE — Telephone Encounter (Signed)
furosemide (LASIX) 20 MG tablet  Medication   Date: 05/14/2015  Department: [facility]  Ordering/Authorizing: Susy Frizzle, MD      Order Providers    Prescribing Provider Encounter Provider   Susy Frizzle, MD Susy Frizzle, MD    Medication Detail      Disp Refills Start End     furosemide (LASIX) 20 MG tablet 120 tablet 1 05/14/2015     Sig - Route: Take 3 tablets (60 mg total) by mouth 2 (two) times daily. - Oral    E-Prescribing Status: Receipt confirmed by pharmacy (05/14/2015 10:45 AM EST)     Pharmacy    [id number] - [place], [place] - [address].  CALLED PT TO LET HIM KNOW THAT HIS LASIX HAD BEEN SENT IN AND THAT IT COMES FROM [facility]. LVM TO THIS EXTENT.

## 2015-05-16 ENCOUNTER — Ambulatory Visit (INDEPENDENT_AMBULATORY_CARE_PROVIDER_SITE_OTHER): Payer: Medicare Other | Admitting: Family Medicine

## 2015-05-16 ENCOUNTER — Encounter: Payer: Self-pay | Admitting: Family Medicine

## 2015-05-16 VITALS — BP 100/58 | HR 68 | Temp 97.5°F | Resp 18 | Ht 69.0 in | Wt 165.0 lb

## 2015-05-16 DIAGNOSIS — I5022 Chronic systolic (congestive) heart failure: Secondary | ICD-10-CM

## 2015-05-16 DIAGNOSIS — F0391 Unspecified dementia with behavioral disturbance: Secondary | ICD-10-CM

## 2015-05-16 DIAGNOSIS — D485 Neoplasm of uncertain behavior of skin: Secondary | ICD-10-CM

## 2015-05-16 DIAGNOSIS — F03918 Unspecified dementia, unspecified severity, with other behavioral disturbance: Secondary | ICD-10-CM

## 2015-05-16 DIAGNOSIS — J449 Chronic obstructive pulmonary disease, unspecified: Secondary | ICD-10-CM

## 2015-05-16 MED ORDER — OXYCODONE HCL 5 MG PO CAPS: 5.0000 mg | ORAL_CAPSULE | Freq: Four times a day (QID) | ORAL | Status: DC | PRN

## 2015-05-16 NOTE — Progress Notes (Signed)
Subjective:    Patient ID: Jay Guzman, male    DOB: Feb 17, 1933, 81 y.o.   MRN: [id number]  Cough  04/22/15 In November, the patient was admitted to the hospital with a severe COPD exacerbation. He was then discharged to a nursing home for rehabilitation. While in the nursing home, he fell several times. At one point he had to go to the emergency room where imaging revealed a compression fracture of the L1 vertebrae which is being managed medically with oxycodone on an as-needed basis. He has now come home from the nursing home. He has end-stage dementia and is very confused. His son stays with him 75 7. Recently he has been extremely lethargic and somnolent. Today in the exam room he is sleeping. He is easily arousable but he sleeps constantly. Personally, he appears overmedicated. He is also been having more frequent nosebleeds recently. He is on continuous oxygen without a humidifier. He is also on Coumadin given his history of atrial fibrillation. An INR was checked today in the clinic and was found to be elevated at 4.8. It was just recently found to be slightly above 2 less than one week ago.  However he is also been on antibiotics recently for urinary tract infection.  At that time, my plan was: Patient appears overmedicated. I will decrease saphris to 5 mg pobid.  He is not taking trazodone.  Decrease the amount of oxycodone he takes on a daily basis.  Hold Coumadin temporarily. Recheck INR on Friday. We will likely resume Coumadin at that time but at a much lower dose most likely 5 mg a day.  I will also send a urine cx to ro uti.  Continue stiolto bid for copd with tid albuterol nebs.  I will check a CMP. At present there is no peripheral edema. There is no crackles appreciated on pulmonary exam. However the patient appears slightly dehydrated. We may need to decrease the Lasix to 40 mg by mouth twice a day if his renal function is worsening.  04/25/15 No visits with results within 1  Week(s) from this visit. Latest known visit with results is:  Office Visit on 05/08/2015  Component Date Value Ref Range Status  . Sodium 05/08/2015 140  135 - 146 mmol/L Final  . Potassium 05/08/2015 4.5  3.5 - 5.3 mmol/L Final  . Chloride 05/08/2015 100  98 - 110 mmol/L Final  . CO2 05/08/2015 32* 20 - 31 mmol/L Final  . Glucose, Bld 05/08/2015 91  65 - 99 mg/dL Final  . BUN 05/08/2015 26* 7 - 25 mg/dL Final  . Creat 05/08/2015 1.61* 0.70 - 1.11 mg/dL Final  . Calcium 05/08/2015 8.7  8.6 - 10.3 mg/dL Final  . Digoxin Level 05/08/2015 1.5  0.8 - 2.0 ug/L Final   Comment: ** Please note change in unit of measure. ** FAB Anti-Digoxin (Digibind(R)) in serum/plasma of patients under toxicity therapy may interfere with the digoxin immunoassay.   Renal function was stable, so I have not reduced his dose of lasix.  He is here to recheck his INR.  There was no evidence of a UTI so I believe his somnolence was truly due to overmedication.  Since decreasing saphris to 5 mg pobid.  Today his INR is 1.5. Son states that the patient is still going to sleep as soon as he takes the saphris, but wakes up after only 3-4 hours at night and then stays awake all night causing him to sleep more during the day.  At that time, my plan was: Resume Coumadin 5 mg by mouth daily and recheck INR in one week. Change saphris to 10 mg at night and none during the day. Hopefully this will keep him sleeping more throughout the night and then allow him to be more alert during the day. I believe if we can regulate his circadian rhythm/sleep-wake cycle better it will help alleviate some of his confusion and hypersomnolence during the daytime. He also has significant congestions and his lungs today along with wheezing. I am concerned he is starting to develop a COPD exacerbation. Therefore I will start the patient on doxycycline 100 mg by mouth twice a day for 7 days  05/16/15 Ultimately, the patient had to resume saphris to 10  mg pobid due to agitation and confusion.  However,  Insurance would not cover this in the patient was switched risperdal 0.5 mg by mouth twice a day which seems to be working better.   He has a daily cough. He has been using stiolto  Daily along with  An albuterol nebulizer treatment 3 times a day. He is also been using Robitussin. However on examination today his lungs are is clear as I have ever heard them. There is no wheezes crackles Rales. He does appear dehydrated. He is not drinking as much. His creatinine has risen from 1.2-1.6 on December 22. A chest x-ray revealed no pulmonary edema. I believe we need to back down on his diuretic there is also a suspicious lesion on the dorsum of his left forearm. It is 1.5 cm in diameter hyperkeratotic and white and tender to the touch.  Past Medical History  Diagnosis Date  . Coronary disease     WITH REMOTE ANTERIOR MYOCARDIAL INFARCTION  . [place] thrombus of heart (HCC)     CHRONIC  . Hypertension   . Hyperlipidemia   . CHF (congestive heart failure) ([place])   . Dementia   . A-fib ([place])   . COPD (chronic obstructive pulmonary disease) ([place])   . Shortness of breath dyspnea    Past Surgical History  Procedure Laterality Date  . Cardiac catheterization  2000  . Hernia repair      STATUS POST BILATERAL  . Abdominal aortic aneurysm repair  2008   Current Outpatient Prescriptions on File Prior to Visit  Medication Sig Dispense Refill  . albuterol (PROVENTIL) (2.5 MG/3ML) 0.083% nebulizer solution Take 2.5 mg by nebulization 3 (three) times daily.    . bisacodyl (DULCOLAX) 10 MG suppository Place 10 mg rectally as needed for moderate constipation.    . diclofenac sodium (VOLTAREN) 1 % GEL Apply 4 g topically 2 (two) times daily. 100 g 5  . digoxin (LANOXIN) 0.25 MG tablet Take 1 tablet (0.25 mg total) by mouth daily. 90 tablet 3  . docusate sodium (COLACE) 100 MG capsule Take 1 capsule (100 mg total) by mouth every 12 (twelve) hours. 60 capsule 0  .  donepezil (ARICEPT) 5 MG tablet Take 1 tablet (5 mg total) by mouth at bedtime. 90 tablet 3  . doxazosin (CARDURA) 2 MG tablet Take 1 tablet (2 mg total) by mouth daily. 90 tablet 3  . ezetimibe-simvastatin (VYTORIN) 10-40 MG tablet Take 1 tablet by mouth at bedtime. 90 tablet 3  . furosemide (LASIX) 20 MG tablet Take 3 tablets (60 mg total) by mouth 2 (two) times daily. 120 tablet 1  . guaiFENesin (MUCINEX) 600 MG 12 hr tablet Take 2 tablets (1,200 mg total) by mouth 2 (two) times daily.    Marland Kitchen  metoprolol succinate (TOPROL-XL) 50 MG 24 hr tablet Take 1 tablet (50 mg total) by mouth daily. 90 tablet 0  . mirtazapine (REMERON) 30 MG tablet Take 1 tablet (30 mg total) by mouth at bedtime. 90 tablet 3  . nitroGLYCERIN (NITROSTAT) 0.4 MG SL tablet Place 0.4 mg under the tongue every 5 (five) minutes as needed for chest pain.     Marland Kitchen ondansetron (ZOFRAN) 4 MG tablet Take 4 mg by mouth every 8 (eight) hours as needed for nausea or vomiting.    . OXYGEN Inhale 2 L into the lungs continuous.    . potassium chloride (K-DUR) 10 MEQ tablet Take 1 tablet (10 mEq total) by mouth daily. 90 tablet 3  . risperiDONE (RISPERDAL) 0.5 MG tablet Take 1 tablet (0.5 mg total) by mouth 2 (two) times daily. 180 tablet 3  . STIOLTO RESPIMAT 2.5-2.5 MCG/ACT AERS INHALE 2 PUFFS INTO THE LUNGS DAILY.  5  . traZODone (DESYREL) 50 MG tablet Take 0.5 tablets (25 mg total) by mouth at bedtime. 30 tablet 2  . warfarin (COUMADIN) 5 MG tablet Take 1 tablet (5 mg total) by mouth daily. 90 tablet 3   No current facility-administered medications on file prior to visit.   Allergies  Allergen Reactions  . Codeine Other (See Comments)    Reaction during surgical  Procedure-unknown  . Morphine And Related Other (See Comments)    Reaction unknown-during surgical procedure   Social History   Social History  . Marital Status: Married    Spouse Name: N/A  . Number of Children: 2  . 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 Smoking status: Former Smoker -- 1.00 packs/day for 60 years    Types: Cigarettes  . Smokeless tobacco: Not on file  . Alcohol Use: No  . Drug Use: No  . Sexual Activity: No   Other Topics Concern  . Not on file   Social History Narrative      Review of Systems  Respiratory: Positive for cough.   All other systems reviewed and are negative.      Objective:   Physical Exam  Constitutional: He is sleeping. He is easily aroused.  Cardiovascular: Normal rate, regular rhythm and normal heart sounds.   Pulmonary/Chest: Effort normal. He has no wheezes. He has no rhonchi. He has no rales.  Abdominal: Soft. Bowel sounds are normal. He exhibits no distension. There is no tenderness. There is no rebound and no guarding.  Musculoskeletal: He exhibits no edema.  Neurological: He is easily aroused.  Vitals reviewed.         Assessment & Plan:  Neoplasm of uncertain behavior of skin - Plan: Pathology  Dementia with behavioral disturbance  Chronic systolic congestive heart failure (HCC)  Chronic obstructive pulmonary disease, unspecified COPD, unspecified chronic bronchitis type   this is the best his lungs have sounded in quite some time. Continue stiolto daily with albuterol tid.   He can certainly continue Mucinex and supplement with Robitussin as needed. However I believe he is becoming dehydrated. I want him to decrease his Lasix to 40 mg by mouth twice a day and recheck kidney function in 1 week. The lesion on his arm appears precancerous. I anesthetized the lesion was 0.1% lidocaine and performed a shave biopsy using sterile technique. The lesion was sent to pathology and labeled container and hemostasis was achieved with Drysol and a Band-Aid. Await the biopsy  results.   Continue risperdal bid.

## 2015-05-20 NOTE — Telephone Encounter (Addendum)
Received Appeal determination.   Appeal approved 05/08/2015- 05/13/2016.

## 2015-05-21 LAB — PATHOLOGY

## 2015-05-23 ENCOUNTER — Telehealth: Payer: Self-pay | Admitting: Family Medicine

## 2015-05-23 ENCOUNTER — Encounter: Payer: Self-pay | Admitting: Family Medicine

## 2015-05-23 ENCOUNTER — Ambulatory Visit (INDEPENDENT_AMBULATORY_CARE_PROVIDER_SITE_OTHER): Payer: Medicare Other | Admitting: Family Medicine

## 2015-05-23 VITALS — BP 86/56 | HR 76 | Resp 20 | Ht 69.0 in | Wt 180.0 lb

## 2015-05-23 DIAGNOSIS — F0391 Unspecified dementia with behavioral disturbance: Secondary | ICD-10-CM

## 2015-05-23 DIAGNOSIS — F03918 Unspecified dementia, unspecified severity, with other behavioral disturbance: Secondary | ICD-10-CM

## 2015-05-23 DIAGNOSIS — J449 Chronic obstructive pulmonary disease, unspecified: Secondary | ICD-10-CM

## 2015-05-23 DIAGNOSIS — I5022 Chronic systolic (congestive) heart failure: Secondary | ICD-10-CM | POA: Diagnosis not present

## 2015-05-23 NOTE — Progress Notes (Signed)
Subjective:    Patient ID: Jay Guzman, male    DOB: 1933-01-31, 82 y.o.   MRN: [id number]  Cough  04/22/15 In November, the patient was admitted to the hospital with a severe COPD exacerbation. He was then discharged to a nursing home for rehabilitation. While in the nursing home, he fell several times. At one point he had to go to the emergency room where imaging revealed a compression fracture of the L1 vertebrae which is being managed medically with oxycodone on an as-needed basis. He has now come home from the nursing home. He has end-stage dementia and is very confused. His son stays with him 26 7. Recently he has been extremely lethargic and somnolent. Today in the exam room he is sleeping. He is easily arousable but he sleeps constantly. Personally, he appears overmedicated. He is also been having more frequent nosebleeds recently. He is on continuous oxygen without a humidifier. He is also on Coumadin given his history of atrial fibrillation. An INR was checked today in the clinic and was found to be elevated at 4.8. It was just recently found to be slightly above 2 less than one week ago.  However he is also been on antibiotics recently for urinary tract infection.  At that time, my plan was: Patient appears overmedicated. I will decrease saphris to 5 mg pobid.  He is not taking trazodone.  Decrease the amount of oxycodone he takes on a daily basis.  Hold Coumadin temporarily. Recheck INR on Friday. We will likely resume Coumadin at that time but at a much lower dose most likely 5 mg a day.  I will also send a urine cx to ro uti.  Continue stiolto bid for copd with tid albuterol nebs.  I will check a CMP. At present there is no peripheral edema. There is no crackles appreciated on pulmonary exam. However the patient appears slightly dehydrated. We may need to decrease the Lasix to 40 mg by mouth twice a day if his renal function is worsening.  04/25/15 No visits with results within 1  Week(s) from this visit. Latest known visit with results is:  Office Visit on 05/16/2015  Component Date Value Ref Range Status  . Report 05/16/2015    Final   Comment: FINAL DIAGNOSIS: A. Skin-Biopsy, Lt arm:        Invasive squamous cell carcinoma.       Malignancy involves deep margin.    COMMENT: Complete excision recommended.  Juliet Rude, MD, [facility] Electronically Signed [id number] [place], [place] [address] CLINICAL HISTORY: D48.5 SOURCE OF SPECIMEN: A: Skin-Biopsy, Lt arm GROSS DESCRIPTION: Received in a single container of formalin labeled with one patient identifier, the patient's name as well as "left arm" is an excision of skin measuring 1.6 x 1.1 x 0.5 cm displaying a well circumscribed, slightly raised, flaky and pale tan lesion measuring 1.3 x 0.8 cm. The surgical margin is inked red. The specimen is serially sectioned and submitted entirely with opposite edges in cassette A1 and the remainder of the specimen in cassette A2.  Only one form of patient ID was present on one or more samples. Two forms of patient ID are required by the [facility]ologists. [place]/srt  Grossing performed at [facility], [id number], [place], [place] [address]. CLIA# [id number], Medical  Director, Jackolyn Confer, M.D., Ph.D., Phone [phone]. This report contains confidential medical information. If you have received this report, in error, please contact [phone]   Renal function was stable, so I have not reduced his dose of lasix.  He is here to recheck his INR.  There was no evidence of a UTI so I believe his somnolence was truly due to overmedication.  Since decreasing saphris to 5 mg pobid.  Today his INR is 1.5. Son states that the patient is still going to sleep as soon as he takes the saphris, but wakes up after only 3-4 hours at night and then stays awake all night causing him to sleep more during the day.   At that time, my plan was: Resume Coumadin 5 mg by mouth daily and recheck INR in one week. Change saphris to 10 mg at night and none during the day. Hopefully this will keep him sleeping more throughout the night and then allow him to be more alert during the day. I believe if we can regulate his circadian rhythm/sleep-wake cycle better it will help alleviate some of his confusion and hypersomnolence during the daytime. He also has significant congestions and his lungs today along with wheezing. I am concerned he is starting to develop a COPD exacerbation. Therefore I will start the patient on doxycycline 100 mg by mouth twice a day for 7 days  05/16/15 Ultimately, the patient had to resume saphris to 10 mg pobid due to agitation and confusion.  However,  Insurance would not cover this in the patient was switched risperdal 0.5 mg by mouth twice a day which seems to be working better.   He has a daily cough. He has been using stiolto  Daily along with  An albuterol nebulizer treatment 3 times a day. He is also been using Robitussin. However on examination today his lungs are is clear as I have ever heard them. There is no wheezes crackles Rales. He does appear dehydrated. He is not drinking as much. His creatinine has risen from 1.2-1.6 on December 22. A chest x-ray revealed no pulmonary edema. I believe we need to back down on his diuretic there is also a suspicious lesion on the dorsum of his left forearm. It is 1.5 cm in diameter hyperkeratotic and white and tender to the touch.  At that time, my plan was:  this is the best his lungs have sounded in quite some time. Continue stiolto daily with albuterol tid.   He can certainly continue Mucinex and supplement with Robitussin as needed. However I believe he is becoming dehydrated. I want him to decrease his Lasix to 40 mg by mouth twice a day and recheck kidney function in 1 week. The lesion on his arm appears precancerous. I anesthetized the lesion was 0.1%  lidocaine and performed a shave biopsy using sterile technique. The lesion was sent to pathology and labeled container and hemostasis was achieved with Drysol and a Band-Aid. Await the biopsy results.   Continue risperdal bid.  05/23/15  patient is here today for follow-up. He is very lethargic and sleepy. He is easily aroused but does immediately back to sleep. At his last office visit I decreased his Lasix due to apparent dehydration. Subsequently he has gained significant weight and has 2+ pitting edema in both legs. He also has faint bibasilar crackles consistent with pulmonary edema. Wt Readings from Last 3 Encounters:  05/23/15 180 lb (81.647 kg)  05/16/15 165 lb (74.844 kg)  05/08/15  166 lb 8 oz (75.524 kg)    As evidenced above he has gained significant weight since his last office visit and he is also fluid overloaded.  Biopsy confirmed squamous cell carcinoma with extension into the deep margins requiring wider excision.  Past Medical History  Diagnosis Date  . Coronary disease     WITH REMOTE ANTERIOR MYOCARDIAL INFARCTION  . [place] thrombus of heart (HCC)     CHRONIC  . Hypertension   . Hyperlipidemia   . CHF (congestive heart failure) ([place])   . Dementia   . A-fib ([place])   . COPD (chronic obstructive pulmonary disease) ([place])   . Shortness of breath dyspnea    Past Surgical History  Procedure Laterality Date  . Cardiac catheterization  2000  . Hernia repair      STATUS POST BILATERAL  . Abdominal aortic aneurysm repair  2008   Current Outpatient Prescriptions on File Prior to Visit  Medication Sig Dispense Refill  . albuterol (PROVENTIL) (2.5 MG/3ML) 0.083% nebulizer solution Take 2.5 mg by nebulization 3 (three) times daily.    . bisacodyl (DULCOLAX) 10 MG suppository Place 10 mg rectally as needed for moderate constipation.    . diclofenac sodium (VOLTAREN) 1 % GEL Apply 4 g topically 2 (two) times daily. 100 g 5  . digoxin (LANOXIN) 0.25 MG tablet Take 1 tablet (0.25 mg  total) by mouth daily. 90 tablet 3  . docusate sodium (COLACE) 100 MG capsule Take 1 capsule (100 mg total) by mouth every 12 (twelve) hours. 60 capsule 0  . donepezil (ARICEPT) 5 MG tablet Take 1 tablet (5 mg total) by mouth at bedtime. 90 tablet 3  . doxazosin (CARDURA) 2 MG tablet Take 1 tablet (2 mg total) by mouth daily. 90 tablet 3  . ezetimibe-simvastatin (VYTORIN) 10-40 MG tablet Take 1 tablet by mouth at bedtime. 90 tablet 3  . furosemide (LASIX) 20 MG tablet Take 3 tablets (60 mg total) by mouth 2 (two) times daily. 120 tablet 1  . guaiFENesin (MUCINEX) 600 MG 12 hr tablet Take 2 tablets (1,200 mg total) by mouth 2 (two) times daily.    . metoprolol succinate (TOPROL-XL) 50 MG 24 hr tablet Take 1 tablet (50 mg total) by mouth daily. 90 tablet 0  . mirtazapine (REMERON) 30 MG tablet Take 1 tablet (30 mg total) by mouth at bedtime. 90 tablet 3  . nitroGLYCERIN (NITROSTAT) 0.4 MG SL tablet Place 0.4 mg under the tongue every 5 (five) minutes as needed for chest pain.     Marland Kitchen ondansetron (ZOFRAN) 4 MG tablet Take 4 mg by mouth every 8 (eight) hours as needed for nausea or vomiting.    Marland Kitchen oxycodone (OXY-IR) 5 MG capsule Take 1 capsule (5 mg total) by mouth every 6 (six) hours as needed. 60 capsule 0  . OXYGEN Inhale 2 L into the lungs continuous.    . potassium chloride (K-DUR) 10 MEQ tablet Take 1 tablet (10 mEq total) by mouth daily. 90 tablet 3  . risperiDONE (RISPERDAL) 0.5 MG tablet Take 1 tablet (0.5 mg total) by mouth 2 (two) times daily. 180 tablet 3  . STIOLTO RESPIMAT 2.5-2.5 MCG/ACT AERS INHALE 2 PUFFS INTO THE LUNGS DAILY.  5  . traZODone (DESYREL) 50 MG tablet Take 0.5 tablets (25 mg total) by mouth at bedtime. 30 tablet 2  . warfarin (COUMADIN) 5 MG tablet Take 1 tablet (5 mg total) by mouth daily. 90 tablet 3   No current facility-administered medications on file  prior to visit.   Allergies  Allergen Reactions  . Codeine Other (See Comments)    Reaction during surgical   Procedure-unknown  . Morphine And Related Other (See Comments)    Reaction unknown-during surgical procedure   Social History   Social History  . Marital Status: Married    Spouse Name: N/A  . Number of Children: 2  . 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 Smoking status: Former Smoker -- 1.00 packs/day for 60 years    Types: Cigarettes  . Smokeless tobacco: Not on file  . Alcohol Use: No  . Drug Use: No  . Sexual Activity: No   Other Topics Concern  . Not on file   Social History Narrative      Review of Systems  Respiratory: Positive for cough.   All other systems reviewed and are negative.      Objective:   Physical Exam  Constitutional: He is sleeping. He is easily aroused.  Cardiovascular: Normal rate, regular rhythm and normal heart sounds.   Pulmonary/Chest: Effort normal. He has wheezes. He has no rhonchi. He has rales.  Abdominal: Soft. Bowel sounds are normal. He exhibits no distension. There is no tenderness. There is no rebound and no guarding.  Musculoskeletal: He exhibits edema.  Neurological: He is easily aroused.  Vitals reviewed.         Assessment & Plan:  Dementia with behavioral disturbance  Chronic systolic congestive heart failure (HCC)  Chronic obstructive pulmonary disease, unspecified COPD, unspecified chronic bronchitis type   I had a very long discussion today with the patient's son. I believe he is in the final stages of dementia. I explained to the son that I believe his life expectancy is less than 6 months. Between his COPD, his congestive heart failure, and his dementia , I believe it is apparent that his body is slowly shutting down.   He obviously needs increased diuretics. Therefore I will increase his Lasix back to 60 mg by mouth twice a day area however I discussed with the son the utility in repeatedly checking blood work to monitor his kidney function. If his  kidney function worsens, I would recommend against dialysis due to his quality of life and his severe dementia and his hypersomnolence. The son agrees at the present time and therefore I see no need to repeatedly checked lab work. I would like to see the patient back in one week if he does not diurese appropriately or if his edema does not improve or if his breathing worsens. However if he is breathing better and his weight improves and his edema improves, he does not need to return next week.  I do not want the patient constantly going back and forth to the doctor as I believe this compromises his quality of life. The son agrees and would like to focus on quality of life. Therefore he will not come back for repeat ov or lab work unless the patient worsens. Instead we will try to improve the patient's quality of life and treat only problems as they arise. In keeping with this, I have recommended against a wider excision of the squamous cell carcinoma. This is not a life-threatening cancer and will certainly not hurt this patient within the next 12-24 months. Therefore I do not see the utility in putting him through another procedure. The son agrees and elects not to proceed with wider excision.

## 2015-05-23 NOTE — Telephone Encounter (Signed)
Patient's son Tharon Aquas called states he just got a call from Dr. Doug Sou office (Cardiologist) patient was seen there on the 22nd they are just now calling him advising him to cut his digox in 1/2. Son would like Dr. Dennard Schaumann to call him.

## 2015-05-26 ENCOUNTER — Encounter (HOSPITAL_COMMUNITY): Payer: Self-pay

## 2015-05-26 ENCOUNTER — Telehealth: Payer: Self-pay | Admitting: Cardiology

## 2015-05-26 ENCOUNTER — Emergency Department (HOSPITAL_COMMUNITY)
Admission: EM | Admit: 2015-05-26 | Discharge: 2015-05-26 | Disposition: A | Discharge status: Home / Self Care | Payer: Medicare Other | Attending: Emergency Medicine | Admitting: Emergency Medicine

## 2015-05-26 ENCOUNTER — Emergency Department (HOSPITAL_COMMUNITY): Payer: Medicare Other

## 2015-05-26 DIAGNOSIS — Z79899 Other long term (current) drug therapy: Secondary | ICD-10-CM | POA: Diagnosis not present

## 2015-05-26 DIAGNOSIS — J441 Chronic obstructive pulmonary disease with (acute) exacerbation: Secondary | ICD-10-CM | POA: Insufficient documentation

## 2015-05-26 DIAGNOSIS — Z87891 Personal history of nicotine dependence: Secondary | ICD-10-CM | POA: Diagnosis not present

## 2015-05-26 DIAGNOSIS — I4891 Unspecified atrial fibrillation: Secondary | ICD-10-CM | POA: Insufficient documentation

## 2015-05-26 DIAGNOSIS — E785 Hyperlipidemia, unspecified: Secondary | ICD-10-CM | POA: Diagnosis not present

## 2015-05-26 DIAGNOSIS — R600 Localized edema: Secondary | ICD-10-CM | POA: Diagnosis not present

## 2015-05-26 DIAGNOSIS — Z7901 Long term (current) use of anticoagulants: Secondary | ICD-10-CM | POA: Diagnosis not present

## 2015-05-26 DIAGNOSIS — R0602 Shortness of breath: Secondary | ICD-10-CM | POA: Diagnosis present

## 2015-05-26 DIAGNOSIS — N390 Urinary tract infection, site not specified: Secondary | ICD-10-CM | POA: Diagnosis not present

## 2015-05-26 DIAGNOSIS — I10 Essential (primary) hypertension: Secondary | ICD-10-CM | POA: Insufficient documentation

## 2015-05-26 DIAGNOSIS — F0390 Unspecified dementia without behavioral disturbance: Secondary | ICD-10-CM | POA: Insufficient documentation

## 2015-05-26 DIAGNOSIS — R634 Abnormal weight loss: Secondary | ICD-10-CM | POA: Diagnosis not present

## 2015-05-26 DIAGNOSIS — Z9889 Other specified postprocedural states: Secondary | ICD-10-CM | POA: Insufficient documentation

## 2015-05-26 DIAGNOSIS — I2510 Atherosclerotic heart disease of native coronary artery without angina pectoris: Secondary | ICD-10-CM | POA: Diagnosis not present

## 2015-05-26 DIAGNOSIS — I509 Heart failure, unspecified: Secondary | ICD-10-CM | POA: Insufficient documentation

## 2015-05-26 LAB — CBC WITH DIFFERENTIAL/PLATELET
Basophils Absolute: 0 10*3/uL (ref 0.0–0.1)
Basophils Relative: 0 %
EOS ABS: 0.2 10*3/uL (ref 0.0–0.7)
EOS PCT: 3 %
HCT: 42.4 % (ref 39.0–52.0)
HEMOGLOBIN: 12.8 g/dL — AB (ref 13.0–17.0)
LYMPHS ABS: 0.9 10*3/uL (ref 0.7–4.0)
LYMPHS PCT: 13 %
MCH: 30.8 pg (ref 26.0–34.0)
MCHC: 30.2 g/dL (ref 30.0–36.0)
MCV: 101.9 fL — AB (ref 78.0–100.0)
MONOS PCT: 12 %
Monocytes Absolute: 0.9 10*3/uL (ref 0.1–1.0)
Neutro Abs: 5.2 10*3/uL (ref 1.7–7.7)
Neutrophils Relative %: 72 %
PLATELETS: 120 10*3/uL — AB (ref 150–400)
RBC: 4.16 MIL/uL — ABNORMAL LOW (ref 4.22–5.81)
RDW: 14.6 % (ref 11.5–15.5)
WBC: 7.2 10*3/uL (ref 4.0–10.5)

## 2015-05-26 LAB — URINALYSIS, ROUTINE W REFLEX MICROSCOPIC
Bilirubin Urine: NEGATIVE
GLUCOSE, UA: NEGATIVE mg/dL
Ketones, ur: NEGATIVE mg/dL
Nitrite: NEGATIVE
PH: 6 (ref 5.0–8.0)
Protein, ur: NEGATIVE mg/dL
SPECIFIC GRAVITY, URINE: 1.009 (ref 1.005–1.030)

## 2015-05-26 LAB — COMPREHENSIVE METABOLIC PANEL
ALK PHOS: 60 U/L (ref 38–126)
ALT: 15 U/L — ABNORMAL LOW (ref 17–63)
ANION GAP: 10 (ref 5–15)
AST: 19 U/L (ref 15–41)
Albumin: 3.2 g/dL — ABNORMAL LOW (ref 3.5–5.0)
BUN: 24 mg/dL — ABNORMAL HIGH (ref 6–20)
CALCIUM: 8.5 mg/dL — AB (ref 8.9–10.3)
CO2: 33 mmol/L — AB (ref 22–32)
CREATININE: 1.43 mg/dL — AB (ref 0.61–1.24)
Chloride: 97 mmol/L — ABNORMAL LOW (ref 101–111)
GFR, EST AFRICAN AMERICAN: 52 mL/min — AB (ref >60)
GFR, EST NON AFRICAN AMERICAN: 45 mL/min — AB (ref >60)
Glucose, Bld: 112 mg/dL — ABNORMAL HIGH (ref 65–99)
Potassium: 4 mmol/L (ref 3.5–5.1)
SODIUM: 140 mmol/L (ref 135–145)
TOTAL PROTEIN: 5.2 g/dL — AB (ref 6.5–8.1)
Total Bilirubin: 1.2 mg/dL (ref 0.3–1.2)

## 2015-05-26 LAB — I-STAT TROPONIN, ED: TROPONIN I, POC: 0.04 ng/mL (ref 0.00–0.08)

## 2015-05-26 LAB — PROTIME-INR
INR: 1.88 — ABNORMAL HIGH (ref 0.00–1.49)
Prothrombin Time: 21.5 seconds — ABNORMAL HIGH (ref 11.6–15.2)

## 2015-05-26 LAB — BRAIN NATRIURETIC PEPTIDE: B NATRIURETIC PEPTIDE 5: 334.9 pg/mL — AB (ref 0.0–100.0)

## 2015-05-26 LAB — DIGOXIN LEVEL: DIGOXIN LVL: 0.6 ng/mL — AB (ref 0.8–2.0)

## 2015-05-26 LAB — URINE MICROSCOPIC-ADD ON: Squamous Epithelial / LPF: NONE SEEN

## 2015-05-26 MED ORDER — OXYCODONE HCL 5 MG PO CAPS: 5.0000 mg | ORAL_CAPSULE | Freq: Four times a day (QID) | ORAL | Status: DC | PRN

## 2015-05-26 MED ORDER — CEPHALEXIN 500 MG PO CAPS: 500.0000 mg | ORAL_CAPSULE | Freq: Two times a day (BID) | ORAL | Status: DC

## 2015-05-26 MED ORDER — ALBUTEROL SULFATE (2.5 MG/3ML) 0.083% IN NEBU
2.5000 mg | INHALATION_SOLUTION | Freq: Once | RESPIRATORY_TRACT | Status: AC
  Administered 2015-05-26: 2.5 mg via RESPIRATORY_TRACT
  Filled 2015-05-26: qty 3

## 2015-05-26 NOTE — ED Notes (Signed)
Pt's son has questions, provider came and spoke to family.

## 2015-05-26 NOTE — Discharge Instructions (Signed)
Double your lasix dose tonight. Call your cardiologist office as soon as possible to schedule an appointment for follow up tomorrow. Return to ED with new, worsening or concerning symptoms. Continue albuterol treatments at home for SOB.   Shortness of Breath Shortness of breath means you have trouble breathing. It could also mean that you have a medical problem. You should get immediate medical care for shortness of breath. CAUSES   Not enough oxygen in the air such as with high altitudes or a smoke-filled room.  Certain lung diseases, infections, or problems.  Heart disease or conditions, such as angina or heart failure.  Low red blood cells (anemia).  Poor physical fitness, which can cause shortness of breath when you exercise.  Chest or back injuries or stiffness.  Being overweight.  Smoking.  Anxiety, which can make you feel like you are not getting enough air. DIAGNOSIS  Serious medical problems can often be found during your physical exam. Tests may also be done to determine why you are having shortness of breath. Tests may include:  Chest X-rays.  Lung function tests.  Blood tests.  An electrocardiogram (ECG).  An ambulatory electrocardiogram. An ambulatory ECG records your heartbeat patterns over a 24-hour period.  Exercise testing.  A transthoracic echocardiogram (TTE). During echocardiography, sound waves are used to evaluate how blood flows through your heart.  A transesophageal echocardiogram (TEE).  Imaging scans. Your health care provider may not be able to find a cause for your shortness of breath after your exam. In this case, it is important to have a follow-up exam with your health care provider as directed.  TREATMENT  Treatment for shortness of breath depends on the cause of your symptoms and can vary greatly. HOME CARE INSTRUCTIONS   Do not smoke. Smoking is a common cause of shortness of breath. If you smoke, ask for help to quit.  Avoid being  around chemicals or things that may bother your breathing, such as paint fumes and dust.  Rest as needed. Slowly resume your usual activities.  If medicines were prescribed, take them as directed for the full length of time directed. This includes oxygen and any inhaled medicines.  Keep all follow-up appointments as directed by your health care provider. SEEK MEDICAL CARE IF:   Your condition does not improve in the time expected.  You have a hard time doing your normal activities even with rest.  You have any new symptoms. SEEK IMMEDIATE MEDICAL CARE IF:   Your shortness of breath gets worse.  You feel light-headed, faint, or develop a cough not controlled with medicines.  You start coughing up blood.  You have pain with breathing.  You have chest pain or pain in your arms, shoulders, or abdomen.  You have a fever.  You are unable to walk up stairs or exercise the way you normally do. MAKE SURE YOU:  Understand these instructions.  Will watch your condition.  Will get help right away if you are not doing well or get worse.   This information is not intended to replace advice given to you by your health care provider. Make sure you discuss any questions you have with your health care provider.   Document Released: 01/26/2001 Document Revised: 05/08/2013 Document Reviewed: 07/19/2011 Elsevier Interactive Patient Education 2016 Elsevier Inc.  Urinary Tract Infection Urinary tract infections (UTIs) can develop anywhere along your urinary tract. Your urinary tract is your body's drainage system for removing wastes and extra water. Your urinary tract includes two  kidneys, two ureters, a bladder, and a urethra. Your kidneys are a pair of bean-shaped organs. Each kidney is about the size of your fist. They are located below your ribs, one on each side of your spine. CAUSES Infections are caused by microbes, which are microscopic organisms, including fungi, viruses, and  bacteria. These organisms are so small that they can only be seen through a microscope. Bacteria are the microbes that most commonly cause UTIs. SYMPTOMS  Symptoms of UTIs may vary by age and gender of the patient and by the location of the infection. Symptoms in young women typically include a frequent and intense urge to urinate and a painful, burning feeling in the bladder or urethra during urination. Older women and men are more likely to be tired, shaky, and weak and have muscle aches and abdominal pain. A fever may mean the infection is in your kidneys. Other symptoms of a kidney infection include pain in your back or sides below the ribs, nausea, and vomiting. DIAGNOSIS To diagnose a UTI, your caregiver will ask you about your symptoms. Your caregiver will also ask you to provide a urine sample. The urine sample will be tested for bacteria and white blood cells. White blood cells are made by your body to help fight infection. TREATMENT  Typically, UTIs can be treated with medication. Because most UTIs are caused by a bacterial infection, they usually can be treated with the use of antibiotics. The choice of antibiotic and length of treatment depend on your symptoms and the type of bacteria causing your infection. HOME CARE INSTRUCTIONS  If you were prescribed antibiotics, take them exactly as your caregiver instructs you. Finish the medication even if you feel better after you have only taken some of the medication.  Drink enough water and fluids to keep your urine clear or pale yellow.  Avoid caffeine, tea, and carbonated beverages. They tend to irritate your bladder.  Empty your bladder often. Avoid holding urine for long periods of time.  Empty your bladder before and after sexual intercourse.  After a bowel movement, women should cleanse from front to back. Use each tissue only once. SEEK MEDICAL CARE IF:   You have back pain.  You develop a fever.  Your symptoms do not begin to  resolve within 3 days. SEEK IMMEDIATE MEDICAL CARE IF:   You have severe back pain or lower abdominal pain.  You develop chills.  You have nausea or vomiting.  You have continued burning or discomfort with urination. MAKE SURE YOU:   Understand these instructions.  Will watch your condition.  Will get help right away if you are not doing well or get worse.   This information is not intended to replace advice given to you by your health care provider. Make sure you discuss any questions you have with your health care provider.   Document Released: 02/10/2005 Document Revised: 01/22/2015 Document Reviewed: 06/11/2011 Elsevier Interactive Patient Education [employer].

## 2015-05-26 NOTE — Telephone Encounter (Signed)
Pt in [place] for evaluation - son states pt has put on 13 lbs over past week, 6 lbs in 12 hrs last night - he brought pt to ED today.  Wants to know if they will keep or discharge pt, what to do about fluid intake, etc - states pt has been gradually getting worse over past month and he is worried.  Advised to see what results of ED visit are - there is pending labwork, etc.  Will route to Dr. [place] to see if other recommendations.

## 2015-05-26 NOTE — Telephone Encounter (Signed)
[name] aware and wanted a refill of pain med and per [facility] ok to refill - RX printed, left up front and son aware to pick up

## 2015-05-26 NOTE — Telephone Encounter (Signed)
Jay Guzman is calling because his dad put on 6lbs on last night and is wanting to ask a question about his medication Digoxin , he received a call to tell him to cut the medication in half. Please call

## 2015-05-26 NOTE — ED Notes (Signed)
Pt brought in EMS for increased SOB and a 10lb weight gain in 13 days per family.  Pt has increased BLE edema.  Pt is on 2L O2 at home.

## 2015-05-26 NOTE — Telephone Encounter (Signed)
They are decreasing digoxin due to his renal fcn.  This is slowing his heart rate.  If his kidneys worsen, the med could build up in his system and slow his heart rate too much.  I would agree with cutting it in half.

## 2015-05-26 NOTE — ED Provider Notes (Signed)
CSN: [id number]     Arrival date & time 05/26/15  1010 History   First MD Initiated Contact with Patient 05/26/15 1016     Chief Complaint  Patient presents with  . Shortness of Breath  . Leg Swelling   Level 5 caveat due to dementia  HPI  Mr. Lenkiewicz is an 82 year old male with PMHx of dementia, a fib on coumadin, CAD, COPD and CHF presenting with increased shortness of breath and leg swelling. History is provided by patient and his son at bedside. His son reports increased weight gain over the past 2 weeks. States that he has gained 10 pounds in 2 weeks and has gained 5 pounds in the last 24 hours. He was seen by his primary care provider last week and had his Lasix decreased. He reports increased swelling so his PCP changed him back to his normal dosage. His son reports he has continued to retain fluid since returning to his normal dosage 3 days ago. His son reports that they do not fluid restrict him and he drinks 6-8 8 oz glasses of water or juice per day. Patient states that he has become more short of breath with ambulation. He does note that he is short of breath at baseline due to his COPD but feels that it has worsened over the weekend. Patient denies cough but son states that he has been coughing for the past few weeks. His son thinks that he is "too weak to cough anything up but I think something is down there". Pt is on 2 L oxygen at baseline. Son also believes pt has a UTI because he has been more lethargic than typical. Son states otherwise he is at his baseline.   Past Medical History  Diagnosis Date  . Coronary disease     WITH REMOTE ANTERIOR MYOCARDIAL INFARCTION  . Mural thrombus of heart (HCC)     CHRONIC  . Hypertension   . Hyperlipidemia   . CHF (congestive heart failure) ([place])   . Dementia   . A-fib ([place])   . COPD (chronic obstructive pulmonary disease) ([place])   . Shortness of breath dyspnea    Past Surgical History  Procedure Laterality Date  . Cardiac  catheterization  2000  . Hernia repair      STATUS POST BILATERAL  . Abdominal aortic aneurysm repair  2008   Family History  Problem Relation Age of Onset  . Cerebral aneurysm Father   . Cancer Sister   . Cancer Mother   . Heart attack Mother   . Heart attack Father   . Stroke Paternal Grandmother   . Stroke Paternal Grandfather   . Hypertension Mother   . Hypertension Father   . Hypertension Sister   . Hypertension Brother    Social History  Substance Use Topics  . Smoking status: Former Smoker -- 1.00 packs/day for 60 years    Types: Cigarettes  . Smokeless tobacco: None  . Alcohol Use: No    Review of Systems  Unable to perform ROS: Dementia  Constitutional: Positive for unexpected weight change.  Respiratory: Positive for shortness of breath.   Cardiovascular: Positive for leg swelling.      Allergies  Codeine and Morphine and related  Home Medications   Prior to Admission medications   Medication Sig Start Date End Date Taking? Authorizing Provider  albuterol (PROVENTIL) (2.5 MG/3ML) 0.083% nebulizer solution Take 2.5 mg by nebulization 3 (three) times daily.   Yes Historical Provider, MD  bisacodyl (DULCOLAX) 10 MG suppository Place 10 mg rectally as needed for moderate constipation.   Yes Historical Provider, MD  diclofenac sodium (VOLTAREN) 1 % GEL Apply 4 g topically 2 (two) times daily. 04/29/15  Yes Susy Frizzle, MD  digoxin (LANOXIN) 0.25 MG tablet Take 1 tablet (0.25 mg total) by mouth daily. Patient taking differently: Take 0.0625 mg by mouth daily.  05/14/15  Yes Susy Frizzle, MD  docusate sodium (COLACE) 100 MG capsule Take 1 capsule (100 mg total) by mouth every 12 (twelve) hours. 03/25/15  Yes Deno Etienne, DO  donepezil (ARICEPT) 5 MG tablet Take 1 tablet (5 mg total) by mouth at bedtime. 05/14/15  Yes Susy Frizzle, MD  doxazosin (CARDURA) 2 MG tablet Take 1 tablet (2 mg total) by mouth daily. 05/14/15  Yes Susy Frizzle, MD   ezetimibe-simvastatin (VYTORIN) 10-40 MG tablet Take 1 tablet by mouth at bedtime. 05/14/15  Yes Susy Frizzle, MD  furosemide (LASIX) 20 MG tablet Take 3 tablets (60 mg total) by mouth 2 (two) times daily. 05/14/15  Yes Susy Frizzle, MD  guaiFENesin (MUCINEX) 600 MG 12 hr tablet Take 2 tablets (1,200 mg total) by mouth 2 (two) times daily. 02/25/15  Yes Bonnielee Haff, MD  metoprolol succinate (TOPROL-XL) 50 MG 24 hr tablet Take 1 tablet (50 mg total) by mouth daily. 05/14/15  Yes Susy Frizzle, MD  mirtazapine (REMERON) 30 MG tablet Take 1 tablet (30 mg total) by mouth at bedtime. 05/14/15  Yes Susy Frizzle, MD  ondansetron (ZOFRAN) 4 MG tablet Take 4 mg by mouth every 8 (eight) hours as needed for nausea or vomiting.   Yes Historical Provider, MD  OXYGEN Inhale 2 L into the lungs continuous.   Yes Historical Provider, MD  potassium chloride (K-DUR) 10 MEQ tablet Take 1 tablet (10 mEq total) by mouth daily. 05/14/15  Yes Susy Frizzle, MD  potassium chloride (K-DUR,KLOR-CON) 10 MEQ tablet Take 10 mEq by mouth daily. 05/16/15  Yes Historical Provider, MD  risperiDONE (RISPERDAL) 0.5 MG tablet Take 1 tablet (0.5 mg total) by mouth 2 (two) times daily. 05/14/15  Yes Susy Frizzle, MD  STIOLTO RESPIMAT 2.5-2.5 MCG/ACT AERS INHALE 2 PUFFS INTO THE LUNGS DAILY. 01/07/15  Yes Historical Provider, MD  traZODone (DESYREL) 50 MG tablet Take 0.5 tablets (25 mg total) by mouth at bedtime. Patient taking differently: Take 25 mg by mouth at bedtime as needed for sleep.  05/05/15  Yes Susy Frizzle, MD  warfarin (COUMADIN) 5 MG tablet Take 1 tablet (5 mg total) by mouth daily. 05/14/15  Yes Susy Frizzle, MD  cephALEXin (KEFLEX) 500 MG capsule Take 1 capsule (500 mg total) by mouth 2 (two) times daily. 05/26/15   Issak Goley, PA-C  nitroGLYCERIN (NITROSTAT) 0.4 MG SL tablet Place 0.4 mg under the tongue every 5 (five) minutes as needed for chest pain.     Historical Provider, MD   oxycodone (OXY-IR) 5 MG capsule Take 1 capsule (5 mg total) by mouth every 6 (six) hours as needed. 05/26/15   Susy Frizzle, MD   BP 112/80 mmHg  Pulse 72  Temp(Src) 97.6 F (36.4 C) (Oral)  Resp 18  Ht 5\' 6"  (1.676 m)  Wt 81.647 kg  BMI 29.07 kg/m2  SpO2 99% Physical Exam  Constitutional: He appears well-developed and well-nourished. No distress.  Sleeps during most of interview and exam. Easily wakes but falls back asleep quickly.   HENT:  Head: Normocephalic  and atraumatic.  Mouth/Throat: Oropharynx is clear and moist.  Eyes: Conjunctivae are normal. Right eye exhibits no discharge. Left eye exhibits no discharge. No scleral icterus.  Neck: Normal range of motion. Neck supple.  Cardiovascular: Normal rate and intact distal pulses.  An irregularly irregular rhythm present.  Pt in a fib without RVR. 2+ pitting edema bilaterally  Pulmonary/Chest: Effort normal. No respiratory distress. He has rales.  Faint bibasilar crackles  Abdominal: Soft. There is no tenderness.  Musculoskeletal: Normal range of motion.  Neurological: He is alert. Coordination normal.  Skin: Skin is warm and dry.  Psychiatric: He has a normal mood and affect. His behavior is normal.  Nursing note and vitals reviewed.   ED Course  Procedures (including critical care time) Labs Review Labs Reviewed  CBC WITH DIFFERENTIAL/PLATELET - Abnormal; Notable for the following:    RBC 4.16 (*)    Hemoglobin 12.8 (*)    MCV 101.9 (*)    Platelets 120 (*)    All other components within normal limits  COMPREHENSIVE METABOLIC PANEL - Abnormal; Notable for the following:    Chloride 97 (*)    CO2 33 (*)    Glucose, Bld 112 (*)    BUN 24 (*)    Creatinine, Ser 1.43 (*)    Calcium 8.5 (*)    Total Protein 5.2 (*)    Albumin 3.2 (*)    ALT 15 (*)    GFR calc non Af Amer 45 (*)    GFR calc Af Amer 52 (*)    All other components within normal limits  BRAIN NATRIURETIC PEPTIDE - Abnormal; Notable for the  following:    B Natriuretic Peptide 334.9 (*)    All other components within normal limits  PROTIME-INR - Abnormal; Notable for the following:    Prothrombin Time 21.5 (*)    INR 1.88 (*)    All other components within normal limits  DIGOXIN LEVEL - Abnormal; Notable for the following:    Digoxin Level 0.6 (*)    All other components within normal limits  URINALYSIS, ROUTINE W REFLEX MICROSCOPIC (NOT AT [facility]) - Abnormal; Notable for the following:    Hgb urine dipstick TRACE (*)    Leukocytes, UA SMALL (*)    All other components within normal limits  URINE MICROSCOPIC-ADD ON - Abnormal; Notable for the following:    Bacteria, UA FEW (*)    All other components within normal limits  URINE CULTURE  I-STAT TROPOININ, ED    Imaging Review Dg Chest 2 View  05/26/2015  CLINICAL DATA:  Shortness of breath and weight gain. Bilateral lower extremity edema. EXAM: CHEST - 2 VIEW COMPARISON:  05/09/2015 FINDINGS: The heart size is at the upper limits of normal. Stable ectasia of the thoracic aorta. Stable mild bilateral pulmonary interstitial prominence. There is no evidence of pulmonary edema, consolidation, pneumothorax, nodule or pleural fluid. The visualized skeletal structures are unremarkable. IMPRESSION: No active disease. Stable chronic pulmonary interstitial prominence. Electronically Signed   By: Aletta Edouard M.D.   On: 05/26/2015 11:26   I have personally reviewed and evaluated these images and lab results as part of my medical decision-making.   EKG Interpretation   Date/Time:  Monday May 26 2015 10:24:32 EST Ventricular Rate:  86 PR Interval:    QRS Duration: 104 QT Interval:  347 QTC Calculation: 415 R Axis:   69 Text Interpretation:  Atrial fibrillation Paired ventricular premature  complexes Abnormal lateral Q waves Anterior infarct, old Poor  data quality  No significant change since last tracing Confirmed by RAY MD, DANIELLE  [phone]) on 05/26/2015 11:58:18 AM       MDM   Final diagnoses:  Shortness of breath  UTI (lower urinary tract infection)   82 year old male presenting with peripheral edema, weight gain and SOB on exertion. Pt's PCP has been changing his lasix dosage recently. Pt is also not being fluid restricted currently. Pt reports SOB on exertion but pt's son states father is largely immobile. VSS. Pt is lethargic but easily awakens to verbal stimuli. Slightly disoriented. Breathing unlabored with bibasilar rales. Pt is in rate controlled a fib. 2+ pitting edema bilaterally. Exam appears unchanged from his PCP visit 3 days ago. CBC unremarkable. Kidney function at baseline. BNP slightly elevated in 300s. CXR negative for pulmonary edema or consolidation. Troponin negative with unchanged ECG. UA with small leuks and few bacteria. Chart review shows recent PCP visit 3 days ago with lasix change and initiating end of life discussion. Pt is at end stage dementia per PCP who estimates a 6 month life expectancy. Spent 20+ minutes discussing goals of care with patient and his two sons. Tharon Aquas (pt's son who lives with him full-time and is in process of becoming POA) wishes to keep patient out of the hospital at this time. Discussed this with patient's cardiologist Dr. Martinique. Plan to double pt's lasix dose this evening, fluid restrict and follow up at his cardiologist's office tomorrow to ensure proper diuresis. Dr. Martinique agrees with this plan. Patient seen by Dr. Jeanell Sparrow as a shared visit. Dr. Jeanell Sparrow agrees with the assessment and plan for Mr. Briski. Return precautions given in discharge paperwork and discussed with pt;s son at bedside. Pt stable for discharge with close follow up tomorrow at his cardiologist office.   Tharon Aquas - pt's son [phone]- [phone])    Josephina Gip, PA-C 05/26/15 1924  Josephina Gip, PA-C 05/26/15 1925  Pattricia Boss, MD 05/31/15 [phone]

## 2015-05-27 ENCOUNTER — Telehealth: Payer: Self-pay | Admitting: Family Medicine

## 2015-05-27 MED ORDER — FUROSEMIDE 20 MG PO TABS: 60.0000 mg | ORAL_TABLET | Freq: Two times a day (BID) | ORAL | Status: DC

## 2015-05-27 MED ORDER — ALBUTEROL SULFATE (2.5 MG/3ML) 0.083% IN NEBU: 2.5000 mg | INHALATION_SOLUTION | Freq: Three times a day (TID) | RESPIRATORY_TRACT | Status: DC

## 2015-05-27 NOTE — Telephone Encounter (Signed)
Pt's son, Tharon Aquas is calling to request a refill of Furosemide 20 mg sent to [facility] and [facility] [address]

## 2015-05-27 NOTE — Telephone Encounter (Signed)
Medication called/sent to requested pharmacy  

## 2015-05-28 ENCOUNTER — Ambulatory Visit (INDEPENDENT_AMBULATORY_CARE_PROVIDER_SITE_OTHER): Payer: Medicare Other | Admitting: Physician Assistant

## 2015-05-28 ENCOUNTER — Encounter: Payer: Self-pay | Admitting: Physician Assistant

## 2015-05-28 VITALS — BP 104/64 | HR 80 | Ht 66.5 in | Wt 176.2 lb

## 2015-05-28 DIAGNOSIS — I4819 Other persistent atrial fibrillation: Secondary | ICD-10-CM

## 2015-05-28 DIAGNOSIS — I481 Persistent atrial fibrillation: Secondary | ICD-10-CM

## 2015-05-28 DIAGNOSIS — I5042 Chronic combined systolic (congestive) and diastolic (congestive) heart failure: Secondary | ICD-10-CM

## 2015-05-28 DIAGNOSIS — Z79899 Other long term (current) drug therapy: Secondary | ICD-10-CM | POA: Diagnosis not present

## 2015-05-28 MED ORDER — FUROSEMIDE 20 MG PO TABS: 60.0000 mg | ORAL_TABLET | ORAL | Status: DC

## 2015-05-28 NOTE — Patient Instructions (Signed)
It is okay to take 2 extra tablet of Furosemide (Lasix) daily (MORNING DOSE ONLY) as needed for weight gain.  >>An updated prescription has been sent to the pharmacy electronically  Continue daily weights, low-sodium diet, and fluid restrictions (32-42 ounces daily).  Your physician recommends that you return for lab work TODAY.  Rhonda Barrett, PA-C, recommends that you schedule a follow-up appointment in 1 month.

## 2015-05-28 NOTE — Progress Notes (Signed)
Cardiology Office Note   Date:  05/28/2015   ID:  DAAIEL BEARDMORE, DOB [birth year], MRN [id number]  PCP:  Odette Fraction, MD  Cardiologist:  Dr Martinique  Fateh Kindle, PA-C   Chief Complaint  Patient presents with  . Hospitalization Follow-up     no chest pain, no shortness of breath, no edema, no pain in legs, no cramping in legs, no lightheadedness or dizziness    History of Present Illness: MANUS BERGTHOLD is a 82 y.o. male with a history of dementia, a fib on coumadin, CAD, COPD on home O2 and chronic combined CHF   O[place] DIRKSEN presents for f/u of ER visit 01/09 for CHF  Mr Lascelles has 2 sons that take very good care of him. They manage his fluid carefully and his medications. They check his weight.  Mr Borden has no complaints. He is confused but can answer questions about how he feels. His activity level is poor, but he has no new DOE. There is no orthopnea or PND. They want to know how much he should drink. When he has swelling, what should they do?  Past Medical History  Diagnosis Date  . Coronary disease     WITH REMOTE ANTERIOR MYOCARDIAL INFARCTION  . [place] thrombus of heart (HCC)     CHRONIC  . Hypertension   . Hyperlipidemia   . CHF (congestive heart failure) ([place])   . Dementia   . A-fib ([place])   . COPD (chronic obstructive pulmonary disease) ([place])   . Shortness of breath dyspnea     Past Surgical History  Procedure Laterality Date  . Cardiac catheterization  2000  . Hernia repair      STATUS POST BILATERAL  . Abdominal aortic aneurysm repair  2008    Current Outpatient Prescriptions  Medication Sig Dispense Refill  . albuterol (PROVENTIL) (2.5 MG/3ML) 0.083% nebulizer solution Take 3 mLs (2.5 mg total) by nebulization 3 (three) times daily. 75 mL 2  . bisacodyl (DULCOLAX) 10 MG suppository Place 10 mg rectally as needed for moderate constipation.    . cephALEXin (KEFLEX) 500 MG capsule Take 1 capsule (500 mg total) by mouth 2  (two) times daily. 14 capsule 0  . diclofenac sodium (VOLTAREN) 1 % GEL Apply 4 g topically 2 (two) times daily. 100 g 5  . digoxin (LANOXIN) 0.25 MG tablet Take 1 tablet (0.25 mg total) by mouth daily. (Patient taking differently: Take 0.0625 mg by mouth daily. ) 90 tablet 3  . docusate sodium (COLACE) 100 MG capsule Take 1 capsule (100 mg total) by mouth every 12 (twelve) hours. 60 capsule 0  . donepezil (ARICEPT) 5 MG tablet Take 1 tablet (5 mg total) by mouth at bedtime. 90 tablet 3  . doxazosin (CARDURA) 2 MG tablet Take 1 tablet (2 mg total) by mouth daily. 90 tablet 3  . ezetimibe-simvastatin (VYTORIN) 10-40 MG tablet Take 1 tablet by mouth at bedtime. 90 tablet 3  . furosemide (LASIX) 20 MG tablet Take 3 tablets (60 mg total) by mouth 2 (two) times daily. 360 tablet 1  . guaiFENesin (MUCINEX) 600 MG 12 hr tablet Take 2 tablets (1,200 mg total) by mouth 2 (two) times daily.    . metoprolol succinate (TOPROL-XL) 50 MG 24 hr tablet Take 1 tablet (50 mg total) by mouth daily. 90 tablet 0  . mirtazapine (REMERON) 30 MG tablet Take 1 tablet (30 mg total) by mouth at bedtime. 90 tablet 3  . nitroGLYCERIN (NITROSTAT) 0.4  MG SL tablet Place 0.4 mg under the tongue every 5 (five) minutes as needed for chest pain.     Marland Kitchen ondansetron (ZOFRAN) 4 MG tablet Take 4 mg by mouth every 8 (eight) hours as needed for nausea or vomiting.    Marland Kitchen oxycodone (OXY-IR) 5 MG capsule Take 1 capsule (5 mg total) by mouth every 6 (six) hours as needed. 60 capsule 0  . OXYGEN Inhale 2 L into the lungs continuous.    . potassium chloride (K-DUR) 10 MEQ tablet Take 1 tablet (10 mEq total) by mouth daily. 90 tablet 3  . potassium chloride (K-DUR,KLOR-CON) 10 MEQ tablet Take 10 mEq by mouth daily.    . risperiDONE (RISPERDAL) 0.5 MG tablet Take 1 tablet (0.5 mg total) by mouth 2 (two) times daily. 180 tablet 3  . STIOLTO RESPIMAT 2.5-2.5 MCG/ACT AERS INHALE 2 PUFFS INTO THE LUNGS DAILY.  5  . traZODone (DESYREL) 50 MG tablet  Take 0.5 tablets (25 mg total) by mouth at bedtime. (Patient taking differently: Take 25 mg by mouth at bedtime as needed for sleep. ) 30 tablet 2  . warfarin (COUMADIN) 5 MG tablet Take 1 tablet (5 mg total) by mouth daily. 90 tablet 3  . PROAIR HFA 108 (90 Base) MCG/ACT inhaler Inhale 2 puffs into the lungs every 6 (six) hours as needed. Use as needed inhale 2 puffs every 6 hours as needed for wheezing or shortness of breath  3   No current facility-administered medications for this visit.    Allergies:   Codeine and Morphine and related    Social History:  The patient  reports that he has quit smoking. His smoking use included Cigarettes. He has a 60 pack-year smoking history. He does not have any smokeless tobacco history on file. He reports that he does not drink alcohol or use illicit drugs.   Family History:  The patient's family history includes Cancer in his mother and sister; Cerebral aneurysm in his father; Heart attack in his father and mother; Hypertension in his brother, father, mother, and sister; Stroke in his paternal grandfather and paternal grandmother.    ROS:  Please see the history of present illness. All other systems are reviewed and negative.    PHYSICAL EXAM: VS:  BP 104/64 mmHg  Pulse 80  Ht 5' 6.5" (1.689 m)  Wt 176 lb 3 oz (79.918 kg)  BMI 28.01 kg/m2 , BMI Body mass index is 28.01 kg/(m^2). GEN: Well nourished, well developed, male in no acute distress HEENT: normal for age  Neck: no JVD, no carotid bruit, no masses Cardiac: Irregular R&R; no murmur, no rubs, or gallops Respiratory:  Decreased BS bases bilaterally, normal work of breathing GI: soft, nontender, nondistended, + BS MS: no deformity or atrophy; no edema; distal pulses are 2+ in all 4 extremities  Skin: warm and dry, no rash Neuro:  Strength and sensation are intact Psych: euthymic mood, full affect   EKG:  EKG is ordered today. The ekg ordered today demonstrates atrial fibrillation,  controlled VR, PVC   Recent Labs: 02/24/2015: Magnesium 2.2 05/26/2015: ALT 15*; B Natriuretic Peptide 334.9*; BUN 24*; Creatinine, Ser 1.43*; Hemoglobin 12.8*; Platelets 120*; Potassium 4.0; Sodium 140    Lipid Panel    Component Value Date/Time   CHOL 124 01/01/2014 0816   TRIG 75 01/01/2014 0816   HDL 39* 01/01/2014 0816   CHOLHDL 3.2 01/01/2014 0816   VLDL 15 01/01/2014 0816   LDLCALC 70 01/01/2014 0816  Wt Readings from Last 3 Encounters:  05/28/15 176 lb 3 oz (79.918 kg)  05/26/15 180 lb (81.647 kg)  05/23/15 180 lb (81.647 kg)     Other studies Reviewed: Additional studies/ records that were reviewed today include: office notes, hospital records.  ASSESSMENT AND PLAN:  1.  Chronic combined CHF: he is doing well from a volume standpoint. He needs to drink 1-1.5 L fluid daily, stay on low Na+ diet. OK to take extra Lasix qam for weight gain. Ck renal function today and f/u in 1 month.  2. Persistent afib: pt is rate-controlled and coumadin managed by Dr Dennard Schaumann. Continue current rx  Current medicines are reviewed at length with the patient today.  The patient does not have concerns regarding medicines.  The following changes have been made:  Extra Lasix PRN  Labs/ tests ordered today include: BMET  Disposition:   FU with Dr Martinique  Signed, Lenoard Aden  05/28/2015 3:05 PM    [facility] [address] [place], [place], [place]  [address] Phone: [phone]; Fax: [phone]

## 2015-05-29 ENCOUNTER — Encounter: Payer: Self-pay | Admitting: *Deleted

## 2015-05-29 ENCOUNTER — Ambulatory Visit: Payer: Medicare Other | Admitting: Family Medicine

## 2015-05-29 LAB — BASIC METABOLIC PANEL
BUN: 27 mg/dL — AB (ref 7–25)
CALCIUM: 8.6 mg/dL (ref 8.6–10.3)
CHLORIDE: 100 mmol/L (ref 98–110)
CO2: 33 mmol/L — AB (ref 20–31)
CREATININE: 1.47 mg/dL — AB (ref 0.70–1.11)
Glucose, Bld: 102 mg/dL — ABNORMAL HIGH (ref 65–99)
Potassium: 4.6 mmol/L (ref 3.5–5.3)
Sodium: 143 mmol/L (ref 135–146)

## 2015-05-30 ENCOUNTER — Other Ambulatory Visit: Payer: Medicare Other

## 2015-05-30 ENCOUNTER — Other Ambulatory Visit: Payer: Self-pay

## 2015-05-30 ENCOUNTER — Telehealth: Payer: Self-pay | Admitting: Family Medicine

## 2015-05-30 DIAGNOSIS — R399 Unspecified symptoms and signs involving the genitourinary system: Secondary | ICD-10-CM

## 2015-05-30 MED ORDER — STIOLTO RESPIMAT 2.5-2.5 MCG/ACT IN AERS: 2.0000 | INHALATION_SPRAY | Freq: Every day | RESPIRATORY_TRACT | Status: AC

## 2015-05-30 MED ORDER — FUROSEMIDE 20 MG PO TABS: ORAL_TABLET | ORAL | Status: AC

## 2015-05-30 NOTE — Telephone Encounter (Signed)
Medication called/sent to requested pharmacy  

## 2015-05-30 NOTE — Telephone Encounter (Signed)
Received a call from patient's son Jay Guzman.He stated father is taking lasix 20 mg 5 tablets in am and 3 tablets in pm.Stated mail order is late and will need a prescription sent to local pharmacy.[facility] [address].

## 2015-05-30 NOTE — Telephone Encounter (Signed)
Pt's son is calling to see if we can call in a prescription for Stiolto Respimat inhaler for him.  [facility] [address] [facility]  [phone]

## 2015-06-01 LAB — URINE CULTURE

## 2015-06-02 ENCOUNTER — Other Ambulatory Visit: Payer: Self-pay | Admitting: Family Medicine

## 2015-06-02 MED ORDER — CIPROFLOXACIN HCL 500 MG PO TABS: 500.0000 mg | ORAL_TABLET | Freq: Two times a day (BID) | ORAL | Status: DC

## 2015-06-06 ENCOUNTER — Telehealth: Payer: Self-pay | Admitting: Family Medicine

## 2015-06-06 MED ORDER — ALBUTEROL SULFATE (2.5 MG/3ML) 0.083% IN NEBU: 2.5000 mg | INHALATION_SOLUTION | Freq: Three times a day (TID) | RESPIRATORY_TRACT | Status: DC

## 2015-06-06 NOTE — Telephone Encounter (Signed)
Medication called/sent to requested pharmacy  

## 2015-06-06 NOTE — Telephone Encounter (Signed)
Pharmacy told him to call us regarding refill on albuterol  [facility] [address]  [phone]

## 2015-06-12 ENCOUNTER — Telehealth: Payer: Self-pay | Admitting: *Deleted

## 2015-06-12 ENCOUNTER — Other Ambulatory Visit: Payer: Self-pay | Admitting: *Deleted

## 2015-06-12 ENCOUNTER — Other Ambulatory Visit: Payer: Self-pay

## 2015-06-12 DIAGNOSIS — R300 Dysuria: Secondary | ICD-10-CM

## 2015-06-12 LAB — URINALYSIS, ROUTINE W REFLEX MICROSCOPIC
Bilirubin Urine: NEGATIVE
GLUCOSE, UA: NEGATIVE
HGB URINE DIPSTICK: NEGATIVE
Ketones, ur: NEGATIVE
LEUKOCYTES UA: NEGATIVE
NITRITE: NEGATIVE
PH: 5.5 (ref 5.0–8.0)
Protein, ur: NEGATIVE
SPECIFIC GRAVITY, URINE: 1.02 (ref 1.001–1.035)

## 2015-06-12 NOTE — Telephone Encounter (Signed)
Received call from [place], [place] with [facility].   Requested orders to continues SN services 1x weekly x9 weeks for medication management.   VO given.

## 2015-06-14 LAB — URINE CULTURE
COLONY COUNT: NO GROWTH
ORGANISM ID, BACTERIA: NO GROWTH

## 2015-06-20 ENCOUNTER — Telehealth: Payer: Self-pay | Admitting: *Deleted

## 2015-06-20 MED ORDER — ALBUTEROL SULFATE (2.5 MG/3ML) 0.083% IN NEBU: 2.5000 mg | INHALATION_SOLUTION | Freq: Three times a day (TID) | RESPIRATORY_TRACT | Status: DC

## 2015-06-20 MED ORDER — OXYCODONE HCL 5 MG PO CAPS: 5.0000 mg | ORAL_CAPSULE | Freq: Four times a day (QID) | ORAL | Status: DC | PRN

## 2015-06-20 NOTE — Telephone Encounter (Signed)
Received call from patient son Jay Guzman that patient requires refill on Albuterol Neb Tx. Prescription sent to pharmacy.   Also requested refill on Oxycodone.   Ok to refill??  Last office visit 05/23/2015.  Last refill 05/26/2015.

## 2015-06-20 NOTE — Telephone Encounter (Signed)
Prescription printed and patient son made aware to come to office to pick up after 2pm on 2/./2017.

## 2015-06-20 NOTE — Telephone Encounter (Signed)
ok 

## 2015-06-30 ENCOUNTER — Encounter: Payer: Self-pay | Admitting: Family Medicine

## 2015-06-30 ENCOUNTER — Ambulatory Visit (INDEPENDENT_AMBULATORY_CARE_PROVIDER_SITE_OTHER): Payer: Medicare Other | Admitting: Family Medicine

## 2015-06-30 VITALS — BP 98/64 | HR 62 | Temp 98.0°F | Resp 12 | Ht 69.0 in

## 2015-06-30 DIAGNOSIS — R8290 Unspecified abnormal findings in urine: Secondary | ICD-10-CM | POA: Diagnosis not present

## 2015-06-30 DIAGNOSIS — R410 Disorientation, unspecified: Secondary | ICD-10-CM

## 2015-06-30 DIAGNOSIS — J189 Pneumonia, unspecified organism: Secondary | ICD-10-CM | POA: Diagnosis not present

## 2015-06-30 MED ORDER — LEVOFLOXACIN 500 MG PO TABS: 500.0000 mg | ORAL_TABLET | Freq: Every day | ORAL | Status: AC

## 2015-06-30 NOTE — Progress Notes (Signed)
Subjective:    Patient ID: Jay Guzman, male    DOB: 1933-03-23, 82 y.o.   MRN: [id number]  Cough  patient has had a cough now for 3 weeks consistent with an upper respiratory infection. Over the last week, his symptoms worsen. He reports shortness of breath, cough which is productive of slight blood-tinged sputum and yellow sputum, worsening wheezing.  Patient is unable to provide much history due to his dementia. However his son states that his cough is worsening and his wheezing is worsening. On exam today he has diffuse expiratory wheezes bilaterally. However he also has faint right by basilar rhonchi  Past Medical History  Diagnosis Date  . Coronary disease     WITH REMOTE ANTERIOR MYOCARDIAL INFARCTION  . [place]ural thrombus of heart (HCC)     CHRONIC  . Hypertension   . Hyperlipidemia   . CHF (congestive heart failure) ([place])   . Dementia   . A-fib ([place])   . COPD (chronic obstructive pulmonary disease) ([place])   . Shortness of breath dyspnea    Past Surgical History  Procedure Laterality Date  . Cardiac catheterization  2000  . Hernia repair      STATUS POST BILATERAL  . Abdominal aortic aneurysm repair  2008   Current Outpatient Prescriptions on File Prior to Visit  Medication Sig Dispense Refill  . albuterol (PROVENTIL) (2.5 MG/3ML) 0.083% nebulizer solution Take 3 mLs (2.5 mg total) by nebulization 3 (three) times daily. 150 mL 2  . bisacodyl (DULCOLAX) 10 MG suppository Place 10 mg rectally as needed for moderate constipation.    . diclofenac sodium (VOLTAREN) 1 % GEL Apply 4 g topically 2 (two) times daily. 100 g 5  . digoxin (LANOXIN) 0.25 MG tablet Take 1 tablet (0.25 mg total) by mouth daily. (Patient taking differently: Take 0.0625 mg by mouth daily. ) 90 tablet 3  . docusate sodium (COLACE) 100 MG capsule Take 1 capsule (100 mg total) by mouth every 12 (twelve) hours. 60 capsule 0  . donepezil (ARICEPT) 5 MG tablet Take 1 tablet (5 mg total) by mouth at bedtime.  90 tablet 3  . doxazosin (CARDURA) 2 MG tablet Take 1 tablet (2 mg total) by mouth daily. 90 tablet 3  . ezetimibe-simvastatin (VYTORIN) 10-40 MG tablet Take 1 tablet by mouth at bedtime. 90 tablet 3  . furosemide (LASIX) 20 MG tablet Take 5 tablets in morning and 3 tablets in afternoon. 60 tablet 1  . guaiFENesin (MUCINEX) 600 MG 12 hr tablet Take 2 tablets (1,200 mg total) by mouth 2 (two) times daily.    . metoprolol succinate (TOPROL-XL) 50 MG 24 hr tablet Take 1 tablet (50 mg total) by mouth daily. 90 tablet 0  . mirtazapine (REMERON) 30 MG tablet Take 1 tablet (30 mg total) by mouth at bedtime. 90 tablet 3  . nitroGLYCERIN (NITROSTAT) 0.4 MG SL tablet Place 0.4 mg under the tongue every 5 (five) minutes as needed for chest pain.     Marland Kitchen ondansetron (ZOFRAN) 4 MG tablet Take 4 mg by mouth every 8 (eight) hours as needed for nausea or vomiting.    Marland Kitchen oxycodone (OXY-IR) 5 MG capsule Take 1 capsule (5 mg total) by mouth every 6 (six) hours as needed. 60 capsule 0  . OXYGEN Inhale 2 L into the lungs continuous.    . potassium chloride (K-DUR) 10 MEQ tablet Take 1 tablet (10 mEq total) by mouth daily. 90 tablet 3  . potassium chloride (K-DUR,KLOR-CON) 10 MEQ  tablet Take 10 mEq by mouth daily.    Marland Kitchen PROAIR HFA 108 (90 Base) MCG/ACT inhaler Inhale 2 puffs into the lungs every 6 (six) hours as needed. Use as needed inhale 2 puffs every 6 hours as needed for wheezing or shortness of breath  3  . risperiDONE (RISPERDAL) 0.5 MG tablet Take 1 tablet (0.5 mg total) by mouth 2 (two) times daily. 180 tablet 3  . STIOLTO RESPIMAT 2.5-2.5 MCG/ACT AERS Inhale 2 puffs into the lungs daily. 1 Inhaler 5  . traZODone (DESYREL) 50 MG tablet Take 0.5 tablets (25 mg total) by mouth at bedtime. (Patient taking differently: Take 25 mg by mouth at bedtime as needed for sleep. ) 30 tablet 2  . warfarin (COUMADIN) 5 MG tablet Take 1 tablet (5 mg total) by mouth daily. 90 tablet 3   No current facility-administered medications  on file prior to visit.   Allergies  Allergen Reactions  . Codeine Other (See Comments)    Reaction during surgical  Procedure-unknown  . Morphine And Related Other (See Comments)    Reaction unknown-during surgical procedure   Social History   Social History  . Marital Status: Married    Spouse Name: N/A  . Number of Children: 2  . 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 Smoking status: Former Smoker -- 1.00 packs/day for 60 years    Types: Cigarettes  . Smokeless tobacco: Not on file  . Alcohol Use: No  . Drug Use: No  . Sexual Activity: No   Other Topics Concern  . Not on file   Social History Narrative      Review of Systems  Respiratory: Positive for cough.   All other systems reviewed and are negative.      Objective:   Physical Exam  Constitutional: He is sleeping. He is easily aroused.  Cardiovascular: Normal rate, regular rhythm and normal heart sounds.   Pulmonary/Chest: Effort normal. He has wheezes. He has no rhonchi. He has rales.  Abdominal: Soft. Bowel sounds are normal. He exhibits no distension. There is no tenderness. There is no rebound and no guarding.  Musculoskeletal: He exhibits edema.  Neurological: He is easily aroused.  Vitals reviewed.         Assessment & Plan:  Bad odor of urine - Plan: Urinalysis, Routine w reflex microscopic (not at [facility])  Mental confusion - Plan: Urinalysis, Routine w reflex microscopic (not at [facility])  CAP (community acquired pneumonia) - Plan: levofloxacin (LEVAQUIN) 500 MG tablet  I'm concerned the patient could be developing a community-acquired pneumonia. I believe pulmonary edema is less likely as his weight at home has been stable and he has no significant edema today on exam. I will treat the patient is community-acquired pneumonia with Levaquin 500 mg by mouth daily for 7 days. If cough is not improving over the next few days, I would like  him to get a chest x-ray will get a chest x-ray immediately if worse

## 2015-07-07 ENCOUNTER — Ambulatory Visit (INDEPENDENT_AMBULATORY_CARE_PROVIDER_SITE_OTHER): Payer: Medicare Other | Admitting: Cardiology

## 2015-07-07 ENCOUNTER — Telehealth: Payer: Self-pay | Admitting: Family Medicine

## 2015-07-07 ENCOUNTER — Ambulatory Visit (INDEPENDENT_AMBULATORY_CARE_PROVIDER_SITE_OTHER): Payer: Medicare Other | Admitting: Pharmacist Clinician (PhC)/ Clinical Pharmacy Specialist

## 2015-07-07 ENCOUNTER — Encounter: Payer: Self-pay | Admitting: Cardiology

## 2015-07-07 VITALS — BP 90/56 | HR 56 | Ht 69.5 in | Wt 176.1 lb

## 2015-07-07 DIAGNOSIS — I2510 Atherosclerotic heart disease of native coronary artery without angina pectoris: Secondary | ICD-10-CM

## 2015-07-07 DIAGNOSIS — I4891 Unspecified atrial fibrillation: Secondary | ICD-10-CM | POA: Diagnosis not present

## 2015-07-07 DIAGNOSIS — I213 ST elevation (STEMI) myocardial infarction of unspecified site: Secondary | ICD-10-CM | POA: Diagnosis not present

## 2015-07-07 DIAGNOSIS — Z7901 Long term (current) use of anticoagulants: Secondary | ICD-10-CM

## 2015-07-07 DIAGNOSIS — E785 Hyperlipidemia, unspecified: Secondary | ICD-10-CM

## 2015-07-07 DIAGNOSIS — I5042 Chronic combined systolic (congestive) and diastolic (congestive) heart failure: Secondary | ICD-10-CM

## 2015-07-07 DIAGNOSIS — Z5181 Encounter for therapeutic drug level monitoring: Secondary | ICD-10-CM

## 2015-07-07 LAB — POCT INR: INR: 1.5

## 2015-07-07 NOTE — Telephone Encounter (Signed)
Pt's son, Steele Sizer called to let Dr. Dennard Schaumann know that Dr. Martinique is increasing his Warfarin dose from 5 mg to 7 mg for two days starting this evening. He also would like him to have an INR this week and one every month from here on out. He would like [place] to send orders to [facility] orders to have it drawn.

## 2015-07-07 NOTE — Patient Instructions (Signed)
Continue your current therapy  We will see you back in 3 months.

## 2015-07-07 NOTE — Progress Notes (Signed)
CARDIOLOGY OFFICE NOTE  Date:  07/07/2015    Jay Guzman Date of Birth: 1933-03-05 Medical Record [id number]  PCP:  Odette Fraction, MD  Cardiologist:    [place]  Chief Complaint  Patient presents with  . Follow-up    per Rosaria Ferries, PA-C//pt c/o occasional SOB, dizziness when he changes positions too quickly, and swelling in bilateral legs/feet/ankles    History of Present Illness: Jay Guzman is a 82 y.o. male who presents today for a follow up visit. He has a past medical history significant for dementia, COPD, hypertension, dyslipidemia and coronary artery disease with remote anterior MI. He had a catheterization in 2000 which showed an occluded LAD. He also has a history of apical thrombus and is on chronic or coumadin therapy for this. He is known to have chronic systolic congestive heart failure and moderate LV dysfunction with an EF of 45-50% with akinesis of the mid apical anteroseptal, anterior, inferior and apical myocardium on echo in April 2016. Chronic LV mural thrombus was noted and there is moderate pulmonary hypertension with an RVSP of 53 mmHg. He also has a history of atrial fibrillation which was recently diagnosed in September of 2016.   He was last admitted to [facility] by internal medicine 02/22/15 -02/25/2015. He presented with shortness of breath and was found to have an acute COPD exacerbation. He was in atrial fibrillation but HR was well controlled. He was also noted to have minimally elevated troponin levels however this was felt to be due to demand ischemia. He denied any chest pain. There were no ischemic changes on his EKG. He did not undergo any ischemic workup. He was instructed to f/u in office for further management of his afib.   Since DC in October he has been treated for CHF with oral lasix. Dementia has advanced and he is being treated around the clock by his sons. They are monitoring his sodium intake. Swelling is down. Weight was  up last month. Lasix was increased for one week. Now better. He was treated for possible PNA one week ago by Dr. Dennard Schaumann. Cough has resolved.   Past Medical History  Diagnosis Date  . Coronary disease     WITH REMOTE ANTERIOR MYOCARDIAL INFARCTION  . Mural thrombus of heart (HCC)     CHRONIC  . Hypertension   . Hyperlipidemia   . CHF (congestive heart failure) ([place])   . Dementia   . A-fib ([place])   . COPD (chronic obstructive pulmonary disease) ([place])   . Shortness of breath dyspnea     Past Surgical History  Procedure Laterality Date  . Cardiac catheterization  2000  . Hernia repair      STATUS POST BILATERAL  . Abdominal aortic aneurysm repair  2008     Medications: Current Outpatient Prescriptions  Medication Sig Dispense Refill  . albuterol (PROVENTIL) (2.5 MG/3ML) 0.083% nebulizer solution Take 3 mLs (2.5 mg total) by nebulization 3 (three) times daily. 150 mL 2  . bisacodyl (DULCOLAX) 10 MG suppository Place 10 mg rectally as needed for moderate constipation.    . diclofenac sodium (VOLTAREN) 1 % GEL Apply 4 g topically 2 (two) times daily. 100 g 5  . digoxin (LANOXIN) 0.25 MG tablet Take 1 tablet (0.25 mg total) by mouth daily. (Patient taking differently: Take 0.0625 mg by mouth daily. ) 90 tablet 3  . docusate sodium (COLACE) 100 MG capsule Take 1 capsule (100 mg total) by mouth every 12 (twelve) hours. [place]  capsule 0  . donepezil (ARICEPT) 5 MG tablet Take 1 tablet (5 mg total) by mouth at bedtime. 90 tablet 3  . doxazosin (CARDURA) 2 MG tablet Take 1 tablet (2 mg total) by mouth daily. 90 tablet 3  . ezetimibe-simvastatin (VYTORIN) 10-40 MG tablet Take 1 tablet by mouth at bedtime. 90 tablet 3  . furosemide (LASIX) 20 MG tablet Take 5 tablets in morning and 3 tablets in afternoon. 60 tablet 1  . guaiFENesin (MUCINEX) 600 MG 12 hr tablet Take 2 tablets (1,200 mg total) by mouth 2 (two) times daily.    Marland Kitchen levofloxacin (LEVAQUIN) 500 MG tablet Take 1 tablet (500 mg total) by  mouth daily. 7 tablet 0  . metoprolol succinate (TOPROL-XL) 50 MG 24 hr tablet Take 1 tablet (50 mg total) by mouth daily. 90 tablet 0  . mirtazapine (REMERON) 30 MG tablet Take 1 tablet (30 mg total) by mouth at bedtime. 90 tablet 3  . nitroGLYCERIN (NITROSTAT) 0.4 MG SL tablet Place 0.4 mg under the tongue every 5 (five) minutes as needed for chest pain.     Marland Kitchen ondansetron (ZOFRAN) 4 MG tablet Take 4 mg by mouth every 8 (eight) hours as needed for nausea or vomiting.    Marland Kitchen oxycodone (OXY-IR) 5 MG capsule Take 1 capsule (5 mg total) by mouth every 6 (six) hours as needed. 60 capsule 0  . OXYGEN Inhale 2 L into the lungs continuous.    . potassium chloride (K-DUR) 10 MEQ tablet Take 1 tablet (10 mEq total) by mouth daily. 90 tablet 3  . potassium chloride (K-DUR,KLOR-CON) 10 MEQ tablet Take 10 mEq by mouth daily.    Marland Kitchen PROAIR HFA 108 (90 Base) MCG/ACT inhaler Inhale 2 puffs into the lungs every 6 (six) hours as needed. Use as needed inhale 2 puffs every 6 hours as needed for wheezing or shortness of breath  3  . risperiDONE (RISPERDAL) 0.5 MG tablet Take 1 tablet (0.5 mg total) by mouth 2 (two) times daily. 180 tablet 3  . STIOLTO RESPIMAT 2.5-2.5 MCG/ACT AERS Inhale 2 puffs into the lungs daily. 1 Inhaler 5  . traZODone (DESYREL) 50 MG tablet Take 0.5 tablets (25 mg total) by mouth at bedtime. (Patient taking differently: Take 25 mg by mouth at bedtime as needed for sleep. ) 30 tablet 2  . warfarin (COUMADIN) 5 MG tablet Take 1 tablet (5 mg total) by mouth daily. 90 tablet 3   No current facility-administered medications for this visit.    Allergies: Allergies  Allergen Reactions  . Codeine Other (See Comments)    Reaction during surgical  Procedure-unknown  . Morphine And Related Other (See Comments)    Reaction unknown-during surgical procedure    Social History: The patient  reports that he has quit smoking. His smoking use included Cigarettes. He has a 60 pack-year smoking history. He  does not have any smokeless tobacco history on file. He reports that he does not drink alcohol or use illicit drugs.   Family History: The patient's  family history includes Cancer in his mother and sister; Cerebral aneurysm in his father; Heart attack in his father and mother; Hypertension in his brother, father, mother, and sister; Stroke in his paternal grandfather and paternal grandmother.   Review of Systems: Please see the history of present illness.   Otherwise, the review of systems is positive for none.   All other systems are reviewed and negative.   Physical Exam: VS:  BP 90/56 mmHg  Pulse  56  Ht 5' 9.5" (1.765 m)  Wt 79.878 kg (176 lb 1.6 oz)  BMI 25.64 kg/m2 .  BMI Body mass index is 25.64 kg/(m^2).  Wt Readings from Last 3 Encounters:  07/07/15 79.878 kg (176 lb 1.6 oz)  05/28/15 79.918 kg (176 lb 3 oz)  05/26/15 81.647 kg (180 lb)    General: Sleeping. He is arousable. Chronically ill. He is in no acute distress. Speech is difficult to understand.  HEENT: Normal. Neck: Supple, no JVD, carotid bruits, or masses noted.  Cardiac: Irregular irregular rhythm. Rate is ok.  Heart tones are distant. Tr edema below knees.   Respiratory:  Lungs have coarse rhonchi in the bases GI: Soft and nontender.  MS: No deformity or atrophy. Gait and ROM intact. Skin: Warm and dry. Color is normal.  Neuro:  Strength and sensation are intact and no gross focal deficits noted.  Psych: Alert, appropriate and with normal affect.   LABORATORY DATA:  EKG:  EKG is not ordered today.  Lab Results  Component Value Date   WBC 7.2 05/26/2015   HGB 12.8* 05/26/2015   HCT 42.4 05/26/2015   PLT 120* 05/26/2015   GLUCOSE 102* 05/28/2015   CHOL 124 01/01/2014   TRIG 75 01/01/2014   HDL 39* 01/01/2014   LDLCALC 70 01/01/2014   ALT 15* 05/26/2015   AST 19 05/26/2015   NA 143 05/28/2015   K 4.6 05/28/2015   CL 100 05/28/2015   CREATININE 1.47* 05/28/2015   BUN 27* 05/28/2015   CO2 33*  05/28/2015   TSH 4.14 09/13/2013   PSA 0.89 01/01/2014   INR 1.5 07/07/2015    BNP (last 3 results)  Recent Labs  01/23/15 1943 02/22/15 0702 05/26/15 1049  BNP 1033.2* 690.8* 334.9*    ProBNP (last 3 results) No results for input(s): PROBNP in the last 8760 hours.   Other Studies Reviewed Today:  Echo Study Conclusions from 08/2014  - Left ventricle: The cavity size was mildly dilated. Wall thickness was increased in a pattern of mild LVH. Systolic function was mildly reduced. The estimated ejection fraction was in the range of 45% to 50%. There is akinesis of the mid-apicalanteroseptal, anterior, inferior, and apical myocardium. Doppler parameters are consistent with abnormal left ventricular relaxation (grade 1 diastolic dysfunction). - Aortic valve: Mildly to moderately calcified annulus. Trileaflet; moderately calcified leaflets. There was mild regurgitation. - Mitral valve: Calcified annulus. There was mild regurgitation. - Left atrium: The atrium was moderately dilated. - Right atrium: The atrium was mildly dilated. Central venous pressure (est): 8 mm Hg. - Tricuspid valve: There was mild regurgitation. - Pulmonary arteries: PA peak pressure: 53 mm Hg (S). - Pericardium, extracardiac: There was no pericardial effusion.  Impressions:  - Mild LVH with LVEF approximately 45%, akinesis noted of the mid to distal anteroseptal, anterior, and inferior walls as well as apex consistent with ischemic cardiomyopathy. History of chronic LV mural thrombus described - not well visualized with this study (could consider microbubble contrast study if further assessment needed). Grade 1 diastolic dysfunction. MAC with mild mitral regurgitation. Sclerotic aortic valve with mild aortic regurgitation. Mild tricuspid regurgitation with PASP 53 mmHg.  Assessment/Plan:  1. Persistent Atrial Fibrillation: rate is controlled.   I would not pursue  trying to restore NSR. Coumadin has yet to be therapeutic and I doubt he would be able to maintain NSR. Family wants a more conservative approach. Will continue rate control and anticoagulation.   2. Chronic Combined Systolic + Diastolic CHF:  Improved on exam.  Will continue lasix. May increase as needed for weight gain. Restrict sodium intake.  Will follow up in 6 weeks.  3. COPD: recent treatment for bronchitis vs PNA. Clinically improved.   4. CAD: stable without angina.   5. HTN: BP is controlled.   6. HLD: on Vytorin.   Current medicines are reviewed with the patient today.  The patient does not have concerns regarding medicines other than what has been noted above. With recent antibiotic therapy will check INR today. Otherwise this is followed by Dr. Dennard Schaumann.   The following changes have been made:  See above.  Labs/ tests ordered today include:   No orders of the defined types were placed in this encounter.     Disposition:   FU 3 months.   Patient is agreeable to this plan and will call if any problems develop in the interim.   Signed: Chibueze Beasley [place] MD, [facility]    07/07/2015 12:53 PM

## 2015-07-08 NOTE — Telephone Encounter (Signed)
Received call from [place] and verbal order given to recheck INR on Friday 07/11/15. He also has a small skin tear on his left upper arm that she is going to clean and dress and when she returns on Friday she will apply zeroform and tegaderm. Verbal ok given for that as well.

## 2015-07-08 NOTE — Telephone Encounter (Signed)
ok 

## 2015-07-09 ENCOUNTER — Telehealth: Payer: Self-pay | Admitting: *Deleted

## 2015-07-09 NOTE — Telephone Encounter (Signed)
Ok to order and ? OK to Refill pain med

## 2015-07-09 NOTE — Telephone Encounter (Signed)
Received call from pt son Tharon Aquas stating that he is needing an order for pt to have a theraupic mattress sent to [facility] and order for [place] on concentrator for his O2 sent to [facility] as well  Pt son also calling stating that pt is needing refill on pain medication Oxycodone

## 2015-07-10 MED ORDER — OXYCODONE HCL 5 MG PO CAPS: 5.0000 mg | ORAL_CAPSULE | Freq: Four times a day (QID) | ORAL | Status: DC | PRN

## 2015-07-10 NOTE — Telephone Encounter (Signed)
[place] with order and refill pain med.

## 2015-07-11 ENCOUNTER — Telehealth: Payer: Self-pay | Admitting: *Deleted

## 2015-07-11 ENCOUNTER — Telehealth: Payer: Self-pay | Admitting: Family Medicine

## 2015-07-11 NOTE — Telephone Encounter (Signed)
Coumadin 7.5 on M/F and 5 mg on all other days and recheck in 2 weeks.

## 2015-07-11 NOTE — Telephone Encounter (Signed)
[facility] called and says that the only mattress options that will be covered for the patient are a gel overlay or an alternating pressure pump pad. The order can be put in Epic. Ph: [phone]

## 2015-07-11 NOTE — Telephone Encounter (Signed)
Son in office and states that pt PT/INR was 1.8 wants to know what needs to be done. Does he need to increase his warfarin?  Please call [phone] Tharon Aquas

## 2015-07-11 NOTE — Telephone Encounter (Signed)
Per Dr. Dennard Schaumann pt can increase warfarin 5mg  to 7.5mg  on Mon and Fridays only and 5mg s other days. Pt son is aware.

## 2015-07-11 NOTE — Telephone Encounter (Signed)
[facility] as to how to put order in Epic

## 2015-07-13 ENCOUNTER — Other Ambulatory Visit: Payer: Self-pay | Admitting: Family Medicine

## 2015-07-14 NOTE — Telephone Encounter (Signed)
[facility] faxed form to be filled out and form was faxed back to [facility].

## 2015-07-23 ENCOUNTER — Other Ambulatory Visit: Payer: Medicare Other

## 2015-07-23 DIAGNOSIS — R8290 Unspecified abnormal findings in urine: Secondary | ICD-10-CM

## 2015-07-23 DIAGNOSIS — R410 Disorientation, unspecified: Secondary | ICD-10-CM

## 2015-07-23 LAB — URINALYSIS, ROUTINE W REFLEX MICROSCOPIC
Bilirubin Urine: NEGATIVE
Glucose, UA: NEGATIVE
HGB URINE DIPSTICK: NEGATIVE
KETONES UR: NEGATIVE
Leukocytes, UA: NEGATIVE
NITRITE: NEGATIVE
PH: 6 (ref 5.0–8.0)
Protein, ur: NEGATIVE
Specific Gravity, Urine: 1.015 (ref 1.001–1.035)

## 2015-07-24 ENCOUNTER — Other Ambulatory Visit: Payer: Self-pay | Admitting: Family Medicine

## 2015-07-24 ENCOUNTER — Ambulatory Visit (HOSPITAL_COMMUNITY)
Admission: RE | Admit: 2015-07-24 | Discharge: 2015-07-24 | Disposition: A | Discharge status: Home / Self Care | Payer: Medicare Other | Source: Ambulatory Visit | Attending: Family Medicine | Admitting: Family Medicine

## 2015-07-24 DIAGNOSIS — R05 Cough: Secondary | ICD-10-CM

## 2015-07-24 DIAGNOSIS — R059 Cough, unspecified: Secondary | ICD-10-CM

## 2015-07-25 ENCOUNTER — Telehealth: Payer: Self-pay | Admitting: *Deleted

## 2015-07-25 LAB — URINE CULTURE
COLONY COUNT: NO GROWTH
Organism ID, Bacteria: NO GROWTH

## 2015-07-25 NOTE — Telephone Encounter (Signed)
Received call from [facility] with PT INR results   INR-3.0  PT-35.9

## 2015-07-28 NOTE — Telephone Encounter (Signed)
inr is okay, recheck in 4 weeks.

## 2015-07-29 ENCOUNTER — Telehealth: Payer: Self-pay | Admitting: Family Medicine

## 2015-07-29 NOTE — Telephone Encounter (Signed)
Pt's home health nurse needs to know when pt needs his INR checked again. Please advise Levada Dy @ [phone] *OK to leave detailed VM

## 2015-07-29 NOTE — Telephone Encounter (Signed)
Pt aware.

## 2015-07-30 ENCOUNTER — Telehealth: Payer: Self-pay | Admitting: Family Medicine

## 2015-07-30 NOTE — Telephone Encounter (Signed)
?   OK to Refill  

## 2015-07-30 NOTE — Telephone Encounter (Signed)
Patient is calling to get rx of his oxycodone

## 2015-07-30 NOTE — Telephone Encounter (Signed)
[facility] to check INR on 08/25/15 which is 4 weeks from last one as per note

## 2015-07-31 MED ORDER — OXYCODONE HCL 5 MG PO CAPS: 5.0000 mg | ORAL_CAPSULE | Freq: Four times a day (QID) | ORAL | Status: DC | PRN

## 2015-07-31 NOTE — Telephone Encounter (Signed)
ok 

## 2015-07-31 NOTE — Telephone Encounter (Signed)
RX printed, left up front and patient's son aware to pick up  

## 2015-08-14 ENCOUNTER — Telehealth: Payer: Self-pay | Admitting: Family Medicine

## 2015-08-14 ENCOUNTER — Other Ambulatory Visit: Payer: Medicare Other

## 2015-08-14 DIAGNOSIS — R8290 Unspecified abnormal findings in urine: Secondary | ICD-10-CM

## 2015-08-14 DIAGNOSIS — R410 Disorientation, unspecified: Secondary | ICD-10-CM

## 2015-08-14 LAB — URINALYSIS, MICROSCOPIC ONLY
BACTERIA UA: NONE SEEN [HPF]
CRYSTALS: NONE SEEN [HPF]
RBC / HPF: NONE SEEN RBC/HPF (ref <=2)
Squamous Epithelial / LPF: NONE SEEN [HPF] (ref <=5)
Yeast: NONE SEEN [HPF]

## 2015-08-14 LAB — URINALYSIS, ROUTINE W REFLEX MICROSCOPIC
BILIRUBIN URINE: NEGATIVE
Glucose, UA: NEGATIVE
Hgb urine dipstick: NEGATIVE
KETONES UR: NEGATIVE
Leukocytes, UA: NEGATIVE
Nitrite: NEGATIVE
PH: 5.5 (ref 5.0–8.0)
SPECIFIC GRAVITY, URINE: 1.02 (ref 1.001–1.035)

## 2015-08-14 NOTE — Telephone Encounter (Signed)
Son states that he is having to give pt 2 oxy at night some nights d/t his arm pain and was wanting to know if we could increase his qty per month?

## 2015-08-15 MED ORDER — OXYCODONE HCL 5 MG PO CAPS: 5.0000 mg | ORAL_CAPSULE | Freq: Four times a day (QID) | ORAL | Status: AC | PRN

## 2015-08-15 NOTE — Telephone Encounter (Signed)
Per [facility] will refill rx for only 30 more tablets and he must slow down on using them as this could contribute to his agitation, confusion and sleepiness.  Pts son aware and rx printed and left up front. [name] aware of recommendations.

## 2015-08-15 NOTE — Telephone Encounter (Signed)
Given his dementia, I think that is not a good idea.  I would add 1 extra strength tylenol with the oxycodone at night.

## 2015-08-21 ENCOUNTER — Ambulatory Visit: Payer: Self-pay | Admitting: *Deleted

## 2015-08-21 DIAGNOSIS — Z5181 Encounter for therapeutic drug level monitoring: Secondary | ICD-10-CM

## 2015-08-21 DIAGNOSIS — I513 Intracardiac thrombosis, not elsewhere classified: Secondary | ICD-10-CM

## 2015-08-26 ENCOUNTER — Telehealth: Payer: Self-pay | Admitting: *Deleted

## 2015-08-26 ENCOUNTER — Encounter: Payer: Self-pay | Admitting: *Deleted

## 2015-08-26 NOTE — Telephone Encounter (Signed)
Pt son called stating that since last night at 9pm he has not been able to wake up pt, Jay Guzman is at home now and still can not wake up pt, pt is moving and breathing but wants to know if we can send referral for HOSPICE to home. Please advise!

## 2015-08-26 NOTE — Telephone Encounter (Signed)
Received call from EMS 30 mins ago stating they are at pt's home and to advise [place] he has passed away.

## 2015-08-26 NOTE — Telephone Encounter (Signed)
This encounter was created in error - please disregard.

## 2015-09-15 DEATH — deceased

## 2015-09-18 ENCOUNTER — Other Ambulatory Visit: Payer: Self-pay | Admitting: Family Medicine

## 2015-09-18 NOTE — Telephone Encounter (Signed)
Refill appropriate and filled per protocol. 

## 2015-09-30 ENCOUNTER — Ambulatory Visit: Payer: Medicare Other | Admitting: Cardiology

## 2016-12-28 ENCOUNTER — Other Ambulatory Visit: Payer: Self-pay | Admitting: Family Medicine

## 2016-12-28 NOTE — Telephone Encounter (Signed)
Medication refilled per protocol. 

## 2017-06-14 IMAGING — DX DG CHEST 2V
3 series · 3 of 3 positions shown · non-contrast
Comparison: 08/26/2014

CLINICAL DATA: Altered mental status and confusion.

EXAM:
CHEST  2 VIEW

[chest pa]
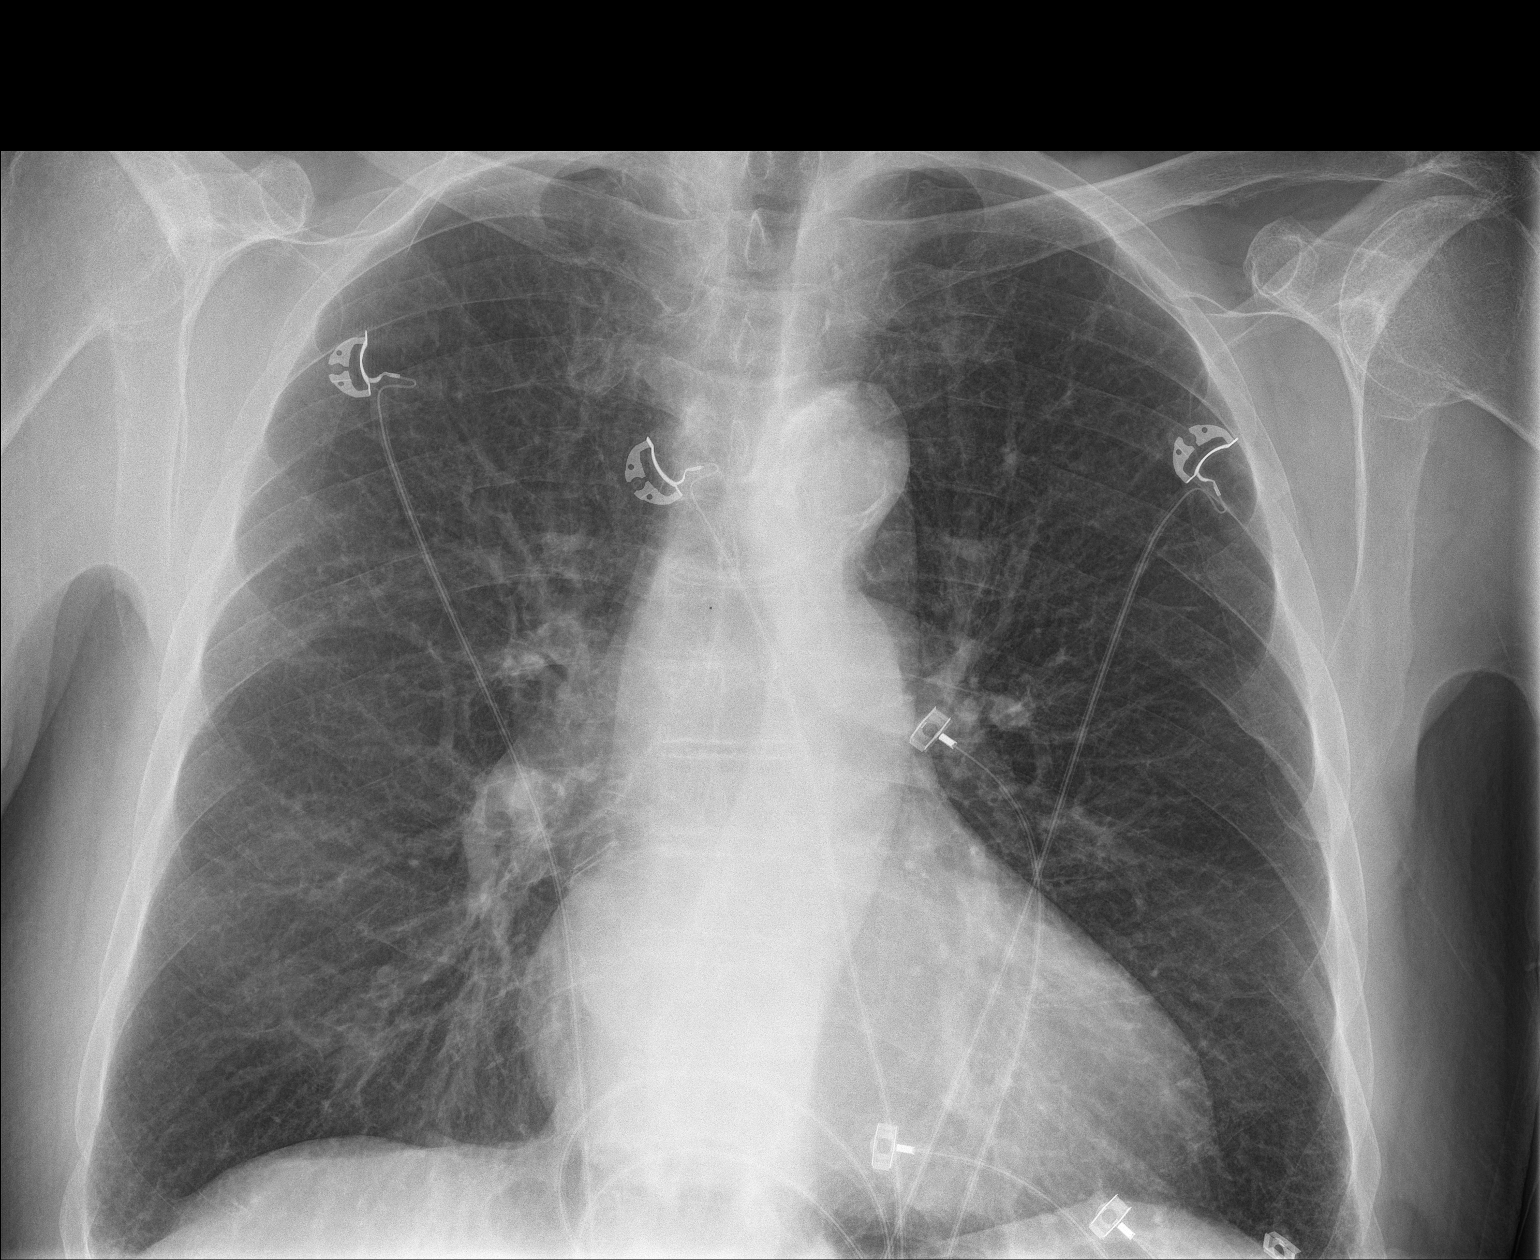

[chest lat]
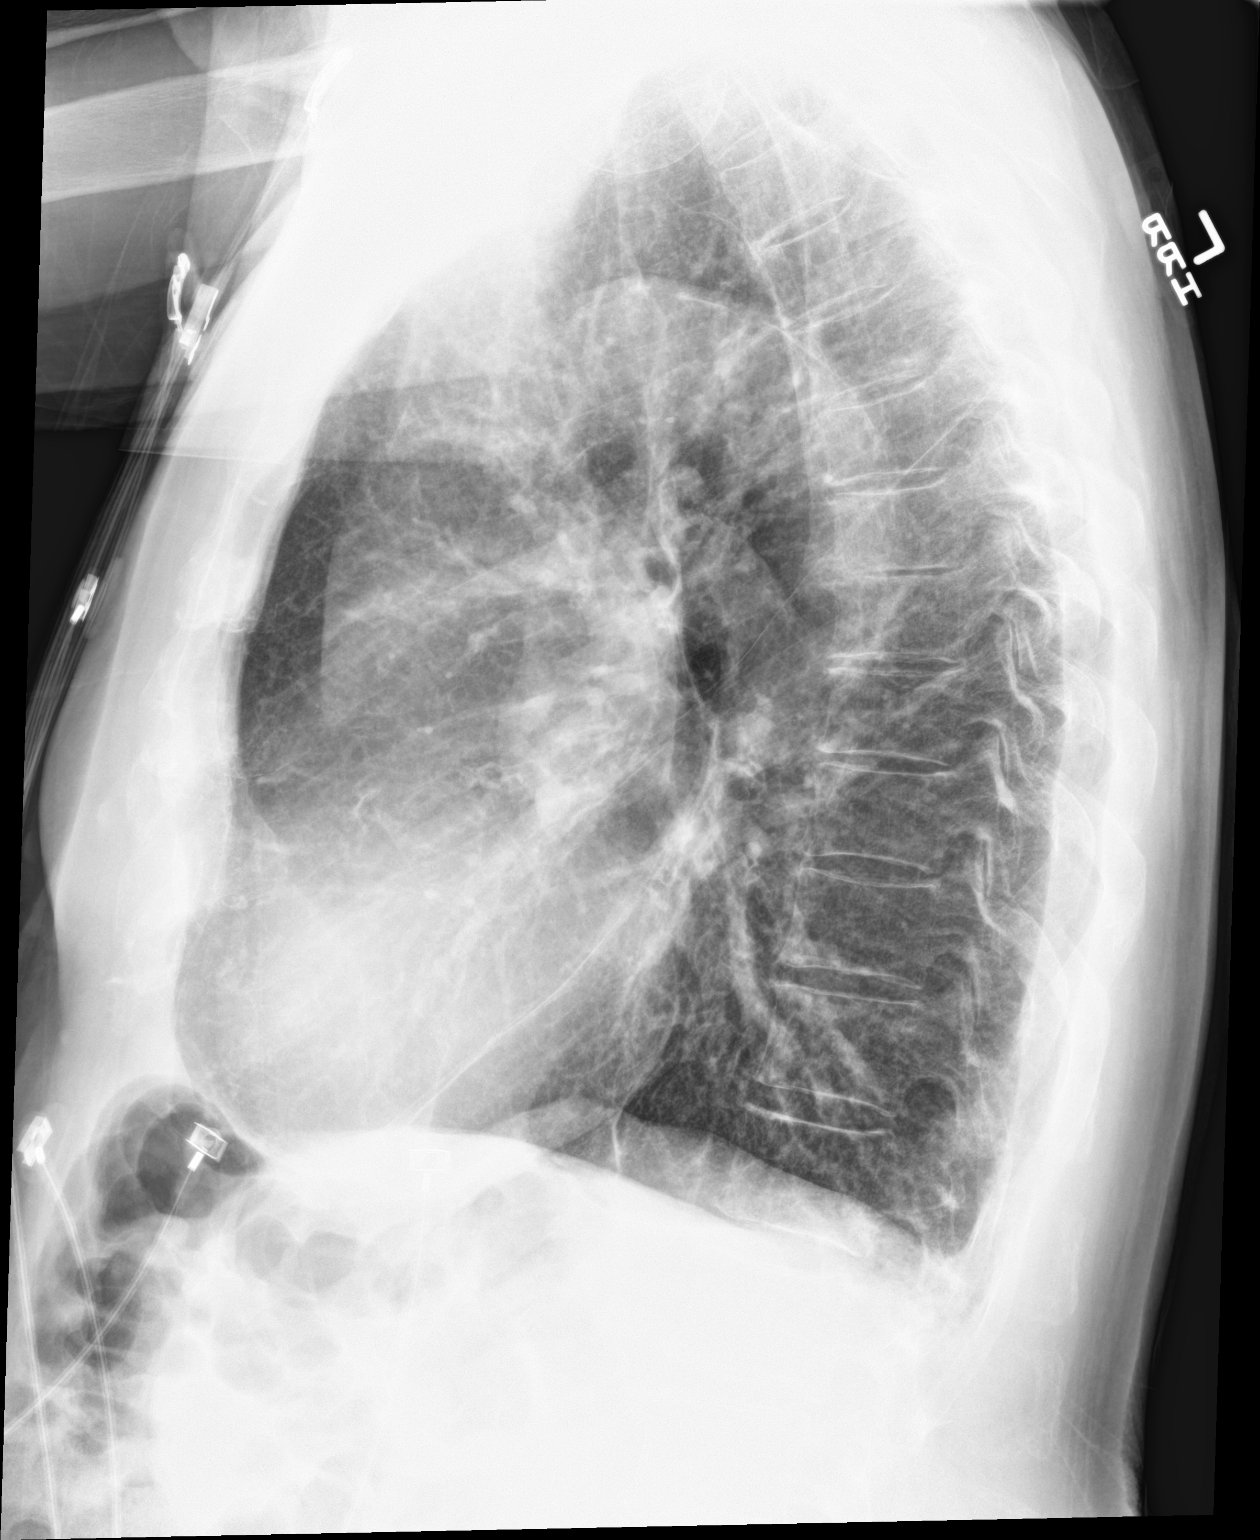

[chest ap]
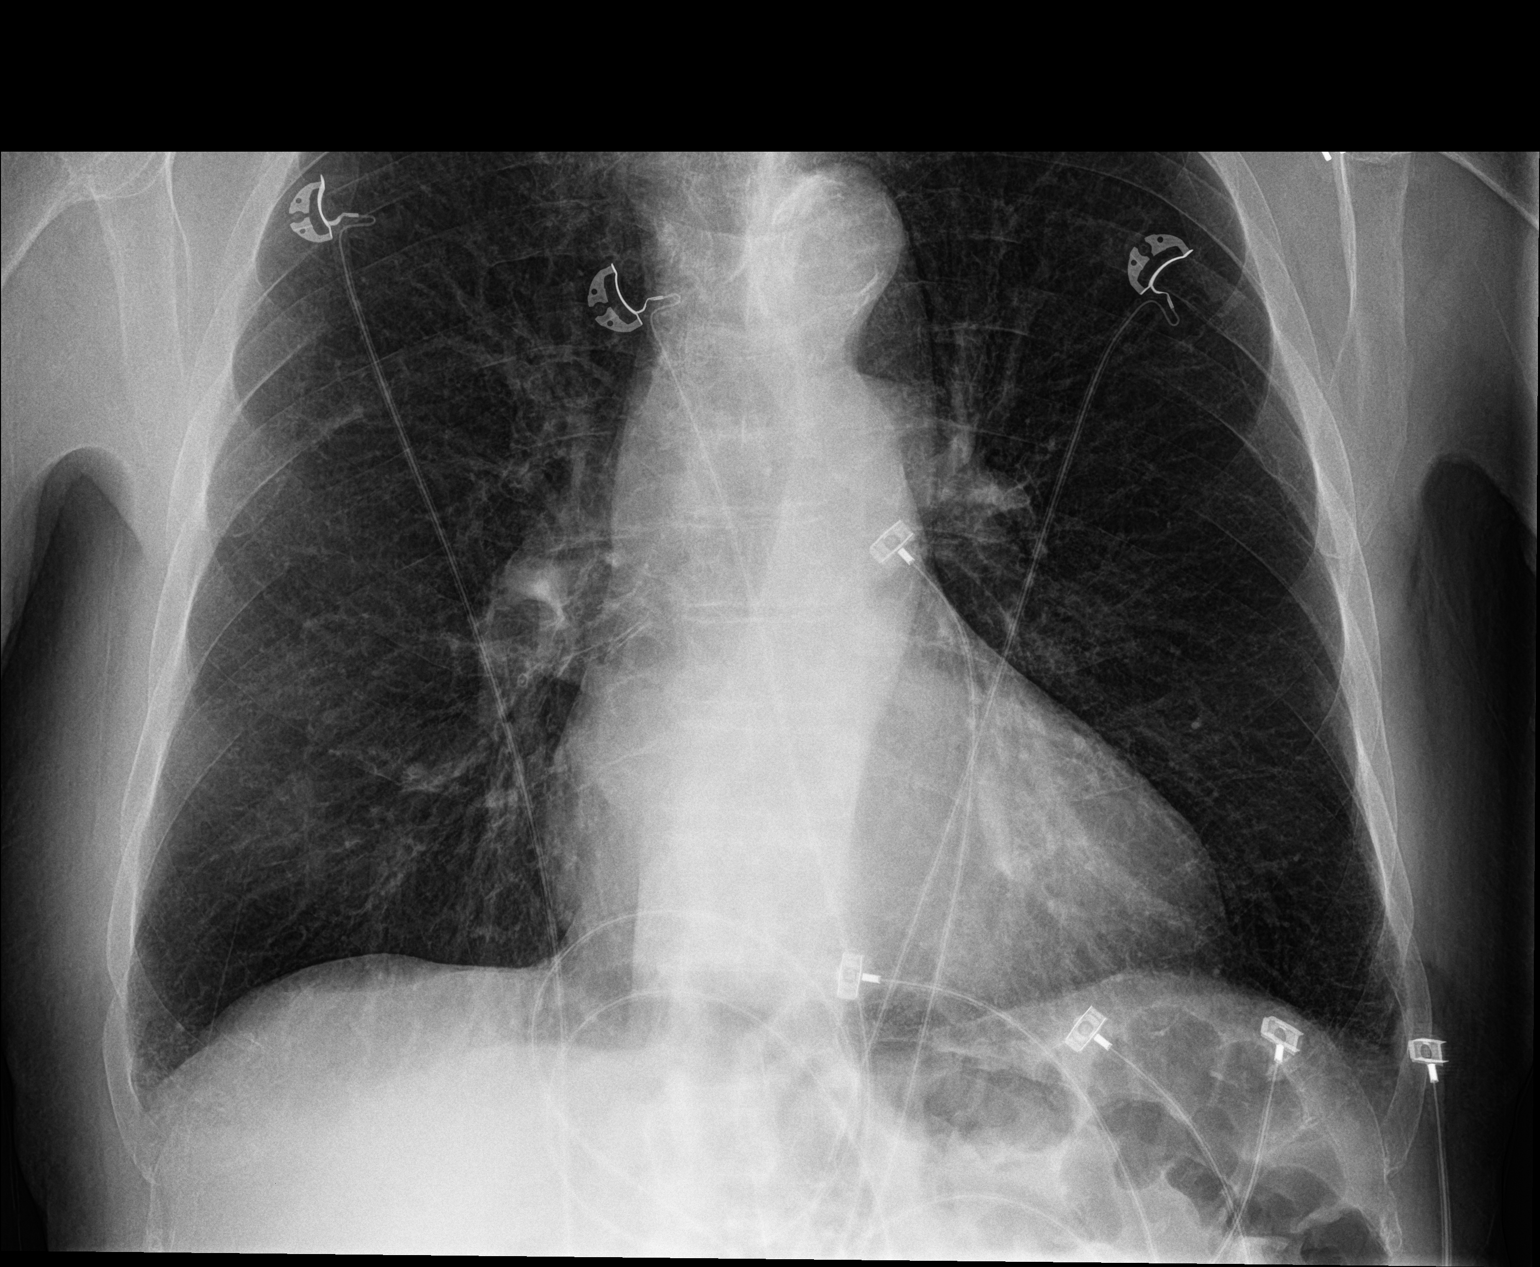

[3 of 3 positions shown; findings below may reference images not displayed]

FINDINGS: Mild cardiac enlargement. Aortic atherosclerosis identified. No
pleural effusion or edema identified. No airspace consolidation. The
lungs are hyperinflated.
IMPRESSION: 1. No acute cardiopulmonary abnormalities.
2. Aortic atherosclerosis.

## 2017-06-14 IMAGING — CT CT HEAD W/O CM
1 series · 16 of 30 positions shown, 20 images · non-contrast
Comparison: None.

CLINICAL DATA: Altered mental status, confusion

EXAM:
CT HEAD WITHOUT CONTRAST
TECHNIQUE: Contiguous axial images were obtained from the base of the skull
through the vertex without intravenous contrast.

[Series 2: headseq 4.8 h37s · axial · 0.43mm/px · z∈[+114,+269]mm · 16 of 36 slices shown, 20 images]
[im 2/36  brain]
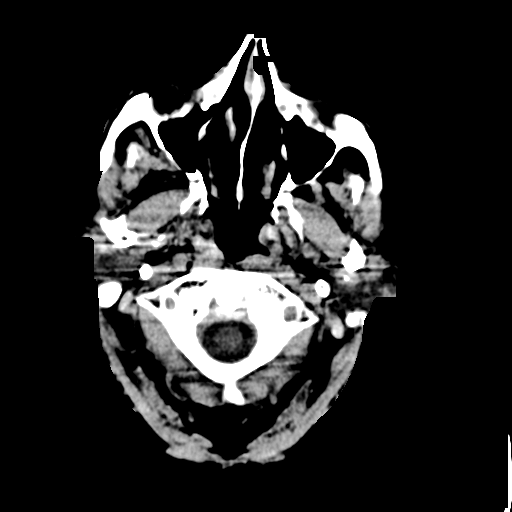
[im 2/36  bone]
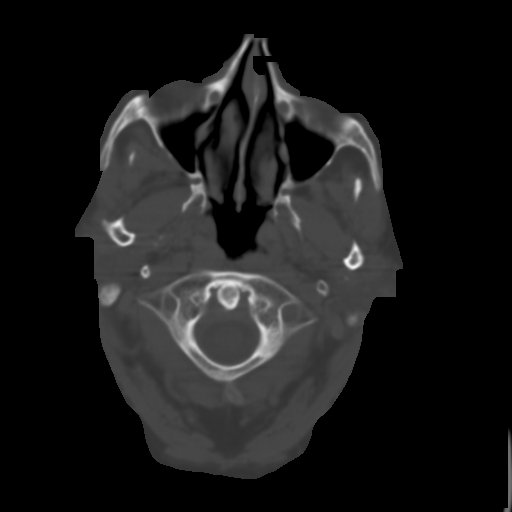
[im 4/36  brain]
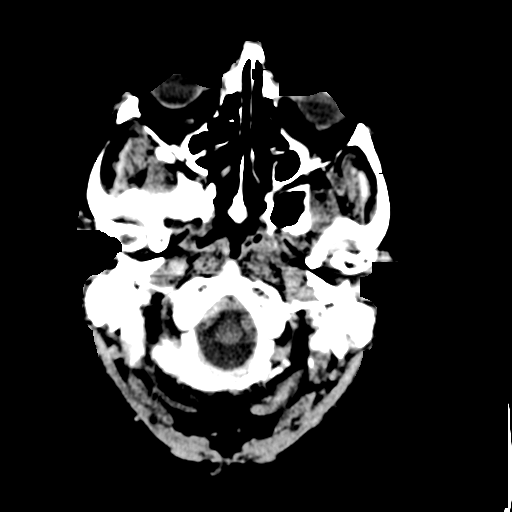
[im 7/36  brain]
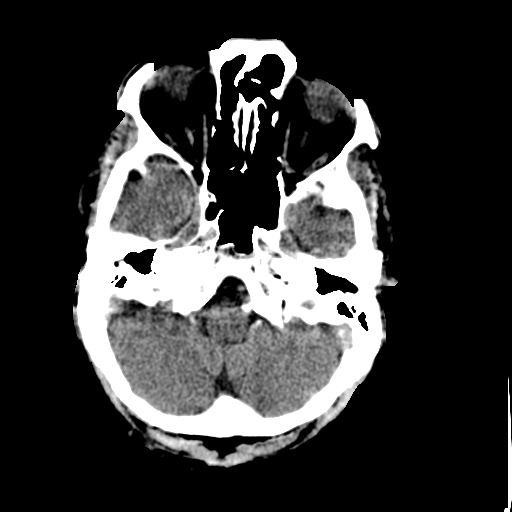
[im 9/36  brain]
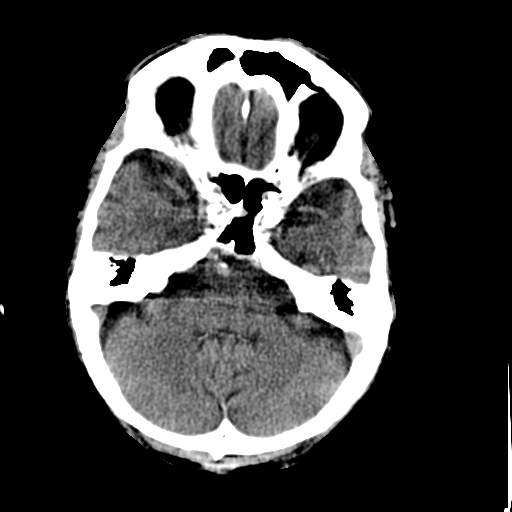
[im 10/36  brain]
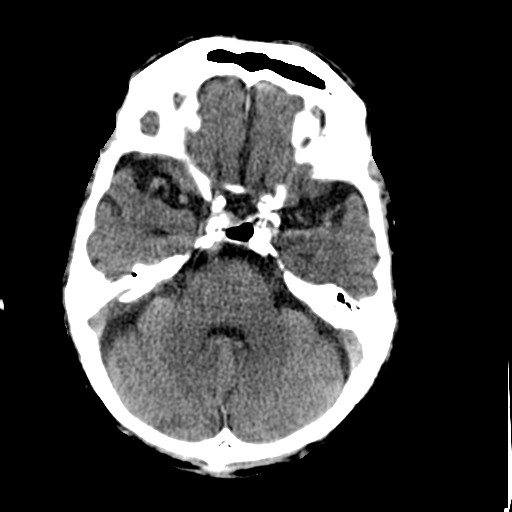
[im 10/36  bone]
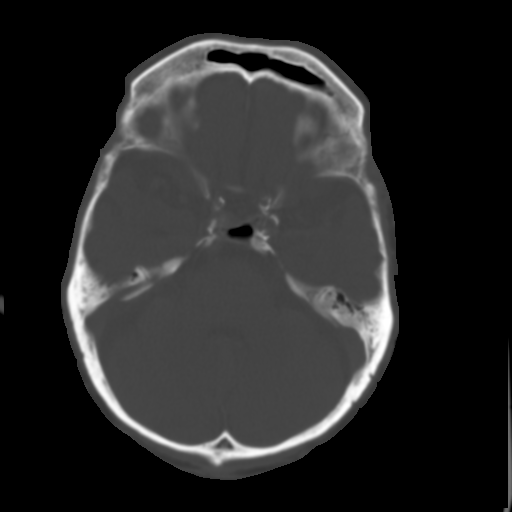
[im 13/36  brain]
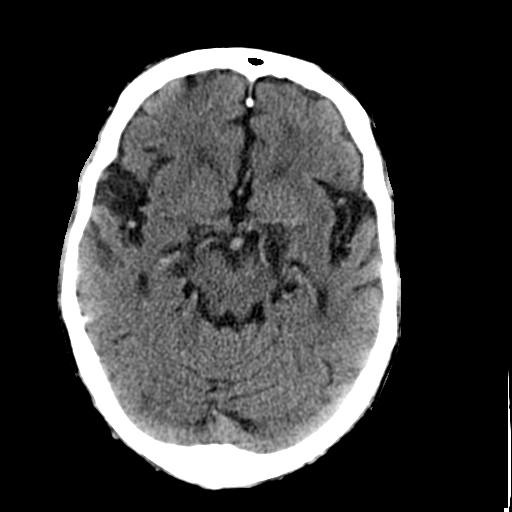
[im 15/36  brain]
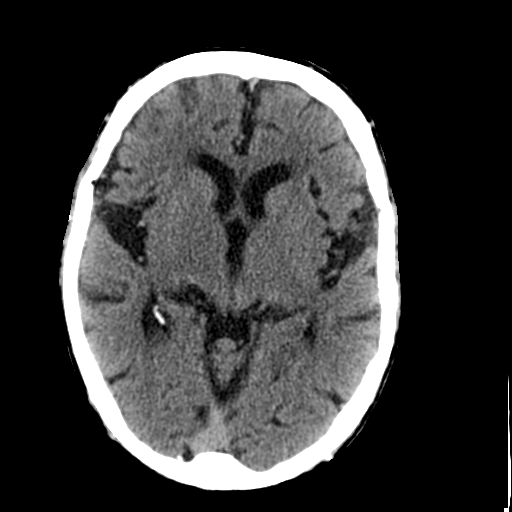
[im 17/36  brain]
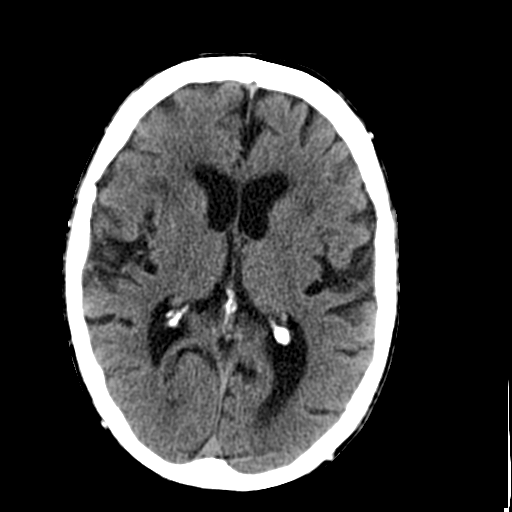
[im 19/36  brain]
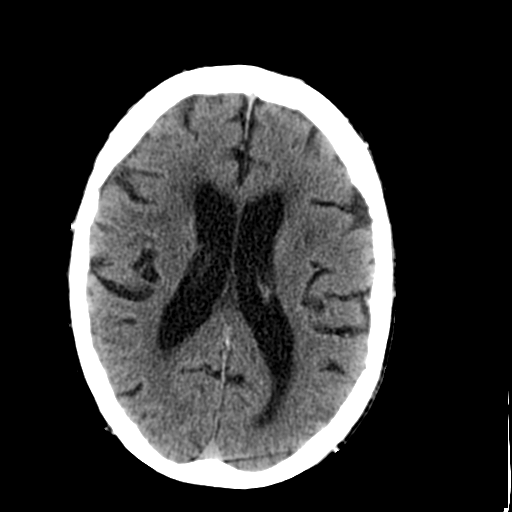
[im 19/36  bone]
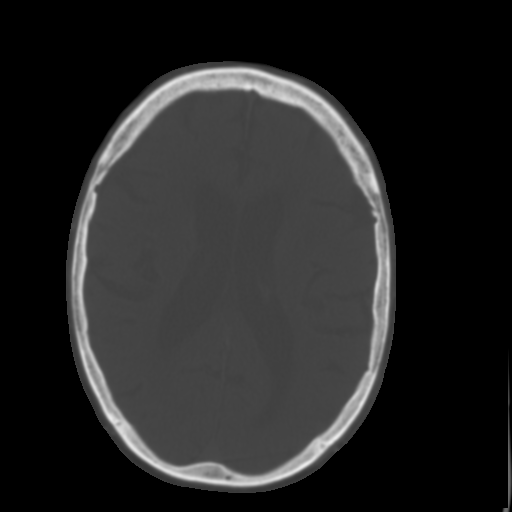
[im 21/36  brain]
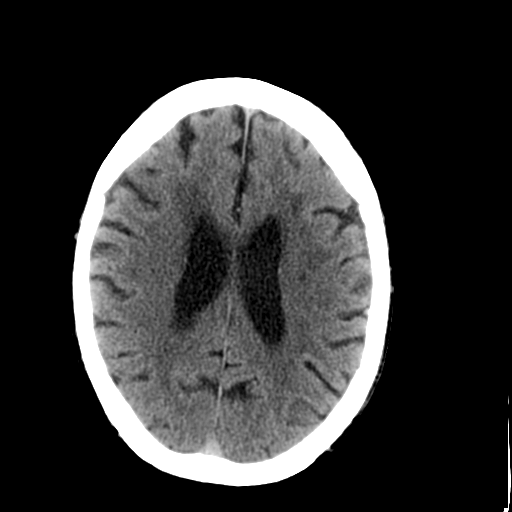
[im 23/36  brain]
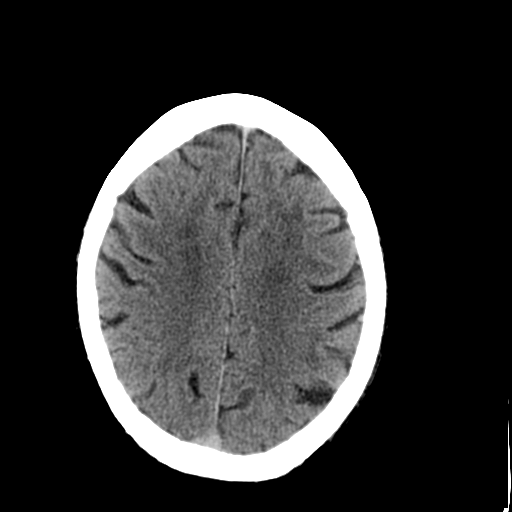
[im 26/36  brain]
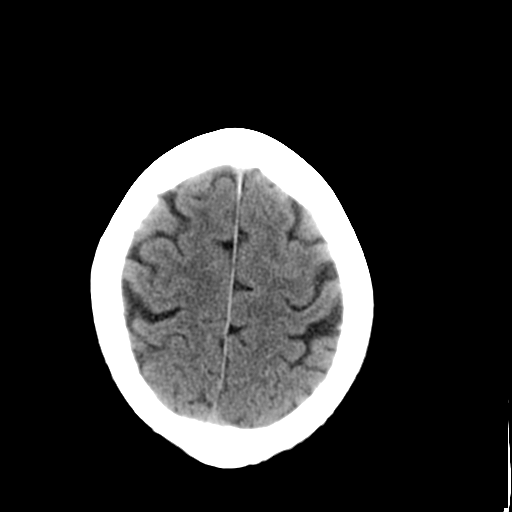
[im 27/36  brain]
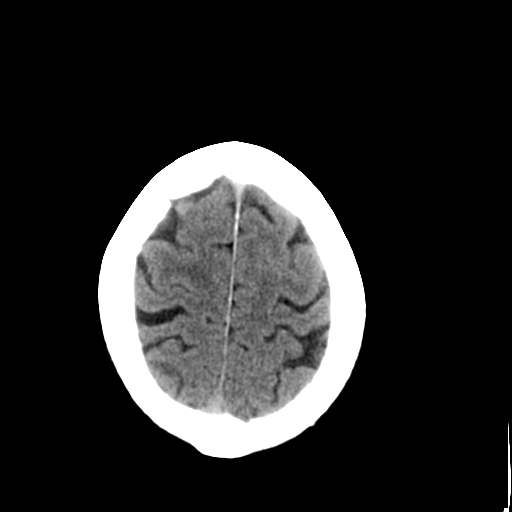
[im 27/36  bone]
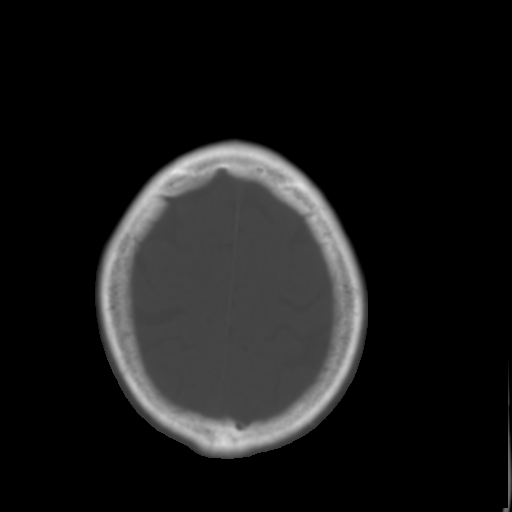
[im 29/36  brain]
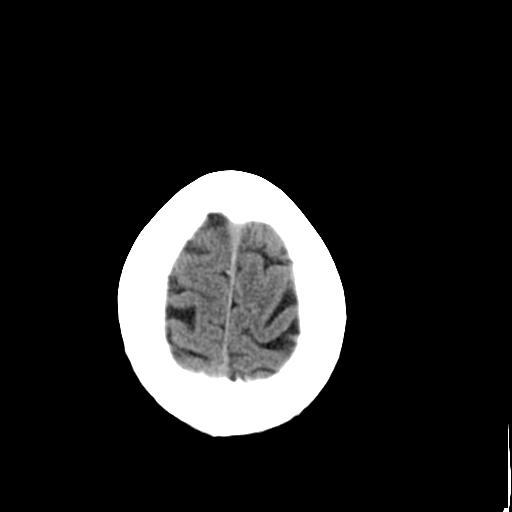
[im 32/36  brain]
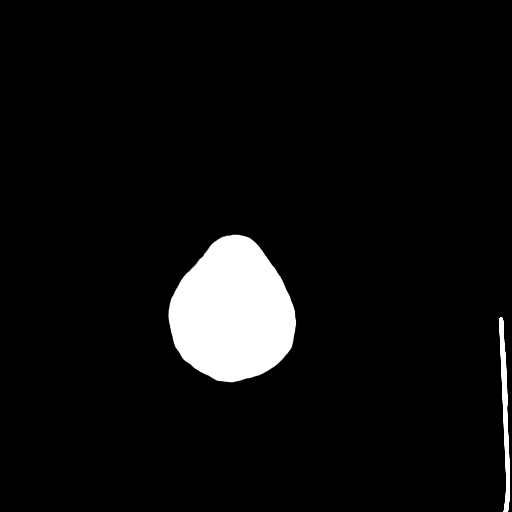
[im 34/36  brain]
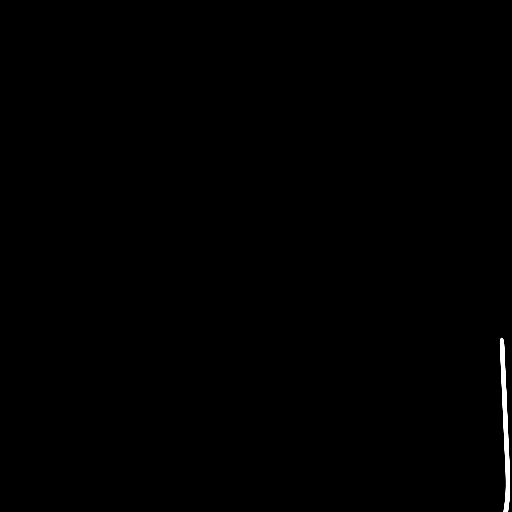

[16 of 30 positions shown; findings below may reference images not displayed]

FINDINGS: No skull fracture is noted. Paranasal sinuses and mastoid air cells
are unremarkable.

No intracranial hemorrhage, mass effect or midline shift.
Atherosclerotic calcifications of carotid siphon. Mild cerebral
atrophy. Mild periventricular white matter decreased attenuation
probable due to chronic small vessel ischemic changes. No acute
cortical infarction. No mass lesion is noted on this unenhanced
scan.
IMPRESSION: No acute intracranial abnormality. Mild cerebral atrophy. Mild
periventricular and patchy subcortical white matter decreased
attenuation probable due to chronic small vessel ischemic changes.

## 2019-09-15 DEATH — deceased
